# Patient Record
Sex: Female | Born: 1960
Health system: Southern US, Community
[De-identification: ages and names within clinical notes are randomized; demographics above are authoritative.]

## PROBLEM LIST (undated history)

## (undated) DIAGNOSIS — T7840XA Allergy, unspecified, initial encounter: Secondary | ICD-10-CM

## (undated) DIAGNOSIS — I10 Essential (primary) hypertension: Secondary | ICD-10-CM

## (undated) DIAGNOSIS — Z9889 Other specified postprocedural states: Secondary | ICD-10-CM

## (undated) DIAGNOSIS — E7800 Pure hypercholesterolemia, unspecified: Secondary | ICD-10-CM

## (undated) DIAGNOSIS — R112 Nausea with vomiting, unspecified: Secondary | ICD-10-CM

## (undated) DIAGNOSIS — N6091 Unspecified benign mammary dysplasia of right breast: Secondary | ICD-10-CM

## (undated) HISTORY — DX: Allergy, unspecified, initial encounter: T78.40XA

## (undated) HISTORY — DX: Essential (primary) hypertension: I10

## (undated) HISTORY — DX: Pure hypercholesterolemia, unspecified: E78.00

## (undated) HISTORY — DX: Unspecified benign mammary dysplasia of right breast: N60.91

---

## 2004-08-17 ENCOUNTER — Ambulatory Visit: Payer: Self-pay | Admitting: General Practice

## 2005-09-21 ENCOUNTER — Ambulatory Visit: Payer: Self-pay | Admitting: General Practice

## 2006-04-06 HISTORY — PX: ABDOMINAL HYSTERECTOMY: SHX81

## 2006-04-09 ENCOUNTER — Inpatient Hospital Stay: Payer: Self-pay | Admitting: Obstetrics and Gynecology

## 2006-06-19 DIAGNOSIS — C4491 Basal cell carcinoma of skin, unspecified: Secondary | ICD-10-CM

## 2006-06-19 HISTORY — DX: Basal cell carcinoma of skin, unspecified: C44.91

## 2006-09-25 ENCOUNTER — Ambulatory Visit: Payer: Self-pay | Admitting: Endocrinology

## 2007-09-27 ENCOUNTER — Ambulatory Visit: Payer: Self-pay | Admitting: Obstetrics and Gynecology

## 2008-03-06 HISTORY — PX: BREAST EXCISIONAL BIOPSY: SUR124

## 2008-03-23 ENCOUNTER — Ambulatory Visit: Payer: Self-pay | Admitting: Family Medicine

## 2008-11-26 ENCOUNTER — Ambulatory Visit: Payer: Self-pay | Admitting: Obstetrics and Gynecology

## 2008-12-04 ENCOUNTER — Ambulatory Visit: Payer: Self-pay | Admitting: Obstetrics and Gynecology

## 2008-12-22 ENCOUNTER — Ambulatory Visit: Payer: Self-pay | Admitting: Surgery

## 2008-12-25 ENCOUNTER — Ambulatory Visit: Payer: Self-pay | Admitting: Surgery

## 2008-12-25 DIAGNOSIS — N6091 Unspecified benign mammary dysplasia of right breast: Secondary | ICD-10-CM

## 2008-12-25 HISTORY — DX: Unspecified benign mammary dysplasia of right breast: N60.91

## 2009-05-17 DIAGNOSIS — C4491 Basal cell carcinoma of skin, unspecified: Secondary | ICD-10-CM

## 2009-05-17 HISTORY — DX: Basal cell carcinoma of skin, unspecified: C44.91

## 2009-09-03 ENCOUNTER — Ambulatory Visit: Payer: Self-pay | Admitting: Oncology

## 2009-09-16 ENCOUNTER — Ambulatory Visit: Payer: Self-pay | Admitting: Oncology

## 2009-10-04 ENCOUNTER — Ambulatory Visit: Payer: Self-pay | Admitting: Oncology

## 2009-10-14 ENCOUNTER — Ambulatory Visit: Payer: Self-pay | Admitting: Oncology

## 2009-11-04 ENCOUNTER — Ambulatory Visit: Payer: Self-pay | Admitting: Oncology

## 2009-11-23 ENCOUNTER — Ambulatory Visit: Payer: Self-pay | Admitting: Gynecologic Oncology

## 2009-12-04 ENCOUNTER — Ambulatory Visit: Payer: Self-pay | Admitting: Oncology

## 2009-12-16 ENCOUNTER — Ambulatory Visit: Payer: Self-pay | Admitting: Obstetrics and Gynecology

## 2011-02-08 ENCOUNTER — Ambulatory Visit: Payer: Self-pay | Admitting: Obstetrics and Gynecology

## 2012-02-13 ENCOUNTER — Ambulatory Visit: Payer: Self-pay | Admitting: Obstetrics and Gynecology

## 2012-02-22 ENCOUNTER — Encounter: Payer: Self-pay | Admitting: Internal Medicine

## 2012-02-22 ENCOUNTER — Ambulatory Visit (INDEPENDENT_AMBULATORY_CARE_PROVIDER_SITE_OTHER): Payer: BC Managed Care – PPO | Admitting: Internal Medicine

## 2012-02-22 VITALS — BP 120/76 | HR 83 | Temp 99.0°F | Resp 16 | Ht 63.5 in | Wt 141.2 lb

## 2012-02-22 DIAGNOSIS — Z9189 Other specified personal risk factors, not elsewhere classified: Secondary | ICD-10-CM

## 2012-02-22 DIAGNOSIS — E1169 Type 2 diabetes mellitus with other specified complication: Secondary | ICD-10-CM | POA: Insufficient documentation

## 2012-02-22 DIAGNOSIS — I10 Essential (primary) hypertension: Secondary | ICD-10-CM

## 2012-02-22 DIAGNOSIS — Z87898 Personal history of other specified conditions: Secondary | ICD-10-CM

## 2012-02-22 DIAGNOSIS — E7800 Pure hypercholesterolemia, unspecified: Secondary | ICD-10-CM

## 2012-02-22 MED ORDER — ATORVASTATIN CALCIUM 20 MG PO TABS: ORAL_TABLET | ORAL | Status: DC

## 2012-02-22 MED ORDER — MONTELUKAST SODIUM 10 MG PO TABS: 10.0000 mg | ORAL_TABLET | Freq: Every day | ORAL | Status: DC

## 2012-02-22 MED ORDER — LISINOPRIL 30 MG PO TABS: 30.0000 mg | ORAL_TABLET | Freq: Every day | ORAL | Status: DC

## 2012-02-22 MED ORDER — HYDROCHLOROTHIAZIDE 25 MG PO TABS: 25.0000 mg | ORAL_TABLET | Freq: Every day | ORAL | Status: DC

## 2012-02-25 ENCOUNTER — Encounter: Payer: Self-pay | Admitting: Internal Medicine

## 2012-02-25 DIAGNOSIS — Z87898 Personal history of other specified conditions: Secondary | ICD-10-CM | POA: Insufficient documentation

## 2012-02-25 NOTE — Progress Notes (Signed)
  Subjective:    Patient ID: Rhonda Sanders, female    DOB: 19-Nov-1960, 51 y.o.   MRN: [id number]  HPI 51 year old female with past history of hypertension and hypercholesterolemia who comes in today for a scheduled follow up.  She states that starting before Thanksgiving she has had some increased congestion.  Initially green.  Has been out of her Singulair and Zyrtec.  Is feeling better, but has had persistent symptoms.  Still with some nasal congestion - clear now.  No chest congestion.  No sob.  Saw Dr Logan Bores 10/13.  Had breast and pelvic exam.  Had her mammogram 02/13/12.  Still exercises.  Eating and drinking well.    Past Medical History  Diagnosis Date  . Hypertension   . Hypercholesterolemia     Current Outpatient Prescriptions on File Prior to Visit  Medication Sig Dispense Refill  . atorvastatin (LIPITOR) 20 MG tablet Take 1/2 tablet daily  90 tablet  3  . hydrochlorothiazide (HYDRODIURIL) 25 MG tablet Take 1 tablet (25 mg total) by mouth daily.  90 tablet  3  . lisinopril (PRINIVIL,ZESTRIL) 30 MG tablet Take 1 tablet (30 mg total) by mouth daily.  90 tablet  3  . mometasone (NASONEX) 50 MCG/ACT nasal spray Place 2 sprays into the nose daily.      . montelukast (SINGULAIR) 10 MG tablet Take 1 tablet (10 mg total) by mouth at bedtime.  90 tablet  3    Review of Systems Patient denies any headache, lightheadedness or dizziness.  Some nasal congestion - persistent.  no sore throat.  No chest pain, tightness or palpitations.  No increased shortness of breath or significant cough or congestion.  No nausea or vomiting.  No abdominal pain or cramping.  No bowel change, such as diarrhea, constipation, BRBPR or melana.  No urine change.        Objective:   Physical Exam Filed Vitals:   02/22/12 1359  BP: 120/76  Pulse: 83  Temp: 99 F (37.2 C)  Resp: 51   51 year old female in no acute distress.   HEENT:  Nares - clear.  OP- without lesions or erythema.  NECK:  Supple, nontender.   No audible bruit.   HEART:  Appears to be regular. LUNGS:  Without crackles or wheezing audible.  Respirations even and unlabored.   RADIAL PULSE:  Equal bilaterally.  ABDOMEN:  Soft, nontender.  No audible abdominal bruit.   EXTREMITIES:  No increased edema to be present.                     Assessment & Plan:  PERSISTENT NASAL CONGESTION.   Do not feel abx warranted.  Saline nasal flushes and nasonex as directed.  Restart her singulair.  Follow.  Notify me if symptoms worsen or do not resolve.   CARDIOVASCULAR.  Asymptomatic.    HEALTH MAINTENANCE.  Just had her breast and pelvic exam - through GYN (Dr Logan Bores).  Obtain records.  States had mammo 02/13/12.  Obtain results.  Check cholesterol.  Will need colon screening this next year.

## 2012-02-25 NOTE — Assessment & Plan Note (Signed)
On Lipitor.   Check lipid panel and liver function.  Low cholesterol diet.

## 2012-02-25 NOTE — Assessment & Plan Note (Signed)
Blood pressure under good control.  Same meds.  Check metabolic panel.  

## 2012-02-25 NOTE — Assessment & Plan Note (Signed)
Is s/p breast lumpectomy and biopsy revealed an area of columnar hyperplasia.  Saw Dr Doree Fudge.  She was referred to the cancer center and had genetic testing - was negative.  Per pt - no further treatment or w/up warranted.  Mammogram 02/08/11 - BiRADS II.  Obtain most recent mammo results.

## 2012-03-04 ENCOUNTER — Other Ambulatory Visit (INDEPENDENT_AMBULATORY_CARE_PROVIDER_SITE_OTHER): Payer: BC Managed Care – PPO

## 2012-03-04 DIAGNOSIS — I10 Essential (primary) hypertension: Secondary | ICD-10-CM

## 2012-03-04 DIAGNOSIS — E7800 Pure hypercholesterolemia, unspecified: Secondary | ICD-10-CM

## 2012-03-04 LAB — HEPATIC FUNCTION PANEL
ALT: 38 U/L — ABNORMAL HIGH (ref 0–35)
AST: 36 U/L (ref 0–37)
Albumin: 4.8 g/dL (ref 3.5–5.2)

## 2012-03-04 LAB — LIPID PANEL
HDL: 59 mg/dL (ref >39.00)
Total CHOL/HDL Ratio: 3
Triglycerides: 176 mg/dL — ABNORMAL HIGH (ref 0.0–149.0)
VLDL: 35.2 mg/dL (ref 0.0–40.0)

## 2012-03-04 LAB — BASIC METABOLIC PANEL
CO2: 28 mEq/L (ref 19–32)
Calcium: 9.6 mg/dL (ref 8.4–10.5)
GFR: 75.37 mL/min (ref >60.00)
Potassium: 3.7 mEq/L (ref 3.5–5.1)
Sodium: 136 mEq/L (ref 135–145)

## 2012-03-05 ENCOUNTER — Other Ambulatory Visit: Payer: Self-pay | Admitting: Internal Medicine

## 2012-03-05 DIAGNOSIS — E8809 Other disorders of plasma-protein metabolism, not elsewhere classified: Secondary | ICD-10-CM

## 2012-03-05 DIAGNOSIS — R945 Abnormal results of liver function studies: Secondary | ICD-10-CM

## 2012-03-05 DIAGNOSIS — R7989 Other specified abnormal findings of blood chemistry: Secondary | ICD-10-CM

## 2012-03-05 NOTE — Progress Notes (Signed)
Order placed for follow up labs 

## 2012-03-06 HISTORY — PX: BREAST BIOPSY: SHX20

## 2012-03-07 ENCOUNTER — Telehealth: Payer: Self-pay | Admitting: *Deleted

## 2012-03-08 ENCOUNTER — Encounter: Payer: Self-pay | Admitting: *Deleted

## 2012-03-27 NOTE — Telephone Encounter (Signed)
Open in

## 2012-04-02 ENCOUNTER — Other Ambulatory Visit (INDEPENDENT_AMBULATORY_CARE_PROVIDER_SITE_OTHER): Payer: BC Managed Care – PPO

## 2012-04-02 DIAGNOSIS — R945 Abnormal results of liver function studies: Secondary | ICD-10-CM

## 2012-04-02 DIAGNOSIS — R7989 Other specified abnormal findings of blood chemistry: Secondary | ICD-10-CM

## 2012-04-02 DIAGNOSIS — E8809 Other disorders of plasma-protein metabolism, not elsewhere classified: Secondary | ICD-10-CM

## 2012-04-02 LAB — CBC WITH DIFFERENTIAL/PLATELET
Basophils Relative: 0.4 % (ref 0.0–3.0)
Eosinophils Relative: 0.6 % (ref 0.0–5.0)
HCT: 40.3 % (ref 36.0–46.0)
Hemoglobin: 13.7 g/dL (ref 12.0–15.0)
Lymphs Abs: 2.4 10*3/uL (ref 0.7–4.0)
MCV: 92 fl (ref 78.0–100.0)
Monocytes Absolute: 0.7 10*3/uL (ref 0.1–1.0)
Neutro Abs: 8.1 10*3/uL — ABNORMAL HIGH (ref 1.4–7.7)
RBC: 4.38 Mil/uL (ref 3.87–5.11)
WBC: 11.3 10*3/uL — ABNORMAL HIGH (ref 4.5–10.5)

## 2012-04-02 LAB — HEPATIC FUNCTION PANEL: Total Bilirubin: 0.8 mg/dL (ref 0.3–1.2)

## 2012-04-06 ENCOUNTER — Other Ambulatory Visit: Payer: Self-pay | Admitting: Internal Medicine

## 2012-04-06 ENCOUNTER — Telehealth: Payer: Self-pay | Admitting: Internal Medicine

## 2012-04-06 DIAGNOSIS — R945 Abnormal results of liver function studies: Secondary | ICD-10-CM

## 2012-04-06 NOTE — Telephone Encounter (Signed)
Pt is coming in Tuesday for labs.  I need to also get a hepatitis panel on her.  I cannot remember the panel you gave me previously.  Do you still have the sheet that gave a list of hepatitis tests.  Thanks.

## 2012-04-06 NOTE — Progress Notes (Signed)
Orders placed for follow up labs 

## 2012-04-06 NOTE — Telephone Encounter (Signed)
Pt coming in Tuesday 8:00 for labs.  Can you please add to lab schedule.  She is aware of time.  Thanks.

## 2012-04-08 NOTE — Telephone Encounter (Signed)
Appointment made

## 2012-04-09 ENCOUNTER — Other Ambulatory Visit (INDEPENDENT_AMBULATORY_CARE_PROVIDER_SITE_OTHER): Payer: BC Managed Care – PPO

## 2012-04-09 ENCOUNTER — Other Ambulatory Visit: Payer: Self-pay | Admitting: Internal Medicine

## 2012-04-09 DIAGNOSIS — R945 Abnormal results of liver function studies: Secondary | ICD-10-CM

## 2012-04-09 DIAGNOSIS — K769 Liver disease, unspecified: Secondary | ICD-10-CM

## 2012-04-09 LAB — HEPATIC FUNCTION PANEL
Bilirubin, Direct: 0.1 mg/dL (ref 0.0–0.3)
Total Bilirubin: 0.5 mg/dL (ref 0.3–1.2)

## 2012-04-11 LAB — OTHER SOLSTAS TEST
Hep A Total Ab: NEGATIVE
Hep B C IgM: NEGATIVE
Hep B Core Total Ab: NEGATIVE
Hep B S Ab: NONREACTIVE
Hepatitis B Surface Ag: NEGATIVE
Hepatitis Be Antigen: NEGATIVE

## 2012-04-12 ENCOUNTER — Ambulatory Visit: Payer: Self-pay | Admitting: Internal Medicine

## 2012-04-16 ENCOUNTER — Telehealth: Payer: Self-pay | Admitting: Internal Medicine

## 2012-04-16 DIAGNOSIS — R748 Abnormal levels of other serum enzymes: Secondary | ICD-10-CM

## 2012-04-16 NOTE — Telephone Encounter (Signed)
Patient returning your call.

## 2012-04-16 NOTE — Telephone Encounter (Signed)
I ordered the referral to GI as pt requested.  Please send copy of there last several labs and the abdominal ultrasound.  (michelle had a copy of the ultrasound).   Let me know if problems.

## 2012-04-16 NOTE — Telephone Encounter (Signed)
Please call pt on her cell at [phone]

## 2012-04-16 NOTE — Telephone Encounter (Signed)
Order placed for referral to GI (Dr Markham Jordan) - for abnormal liver enzymes.  She called back and requested [facility].

## 2012-04-17 NOTE — Telephone Encounter (Signed)
Already spoken to patient.

## 2012-05-08 ENCOUNTER — Encounter: Payer: Self-pay | Admitting: Internal Medicine

## 2012-05-29 ENCOUNTER — Telehealth: Payer: Self-pay | Admitting: Internal Medicine

## 2012-05-29 NOTE — Telephone Encounter (Signed)
Notify pt that I called Kim.  She just needed copies of our labs (apparently did not have).  She wanted to review and then will call pt.  Tell her to let me know if she does not hear from her.  Please fax labs to [phone].  Attn:  Fransico Setters.  I printed out the labs.

## 2012-05-29 NOTE — Telephone Encounter (Signed)
Do you want me to call patient and see what she needs?

## 2012-05-29 NOTE — Telephone Encounter (Signed)
Patient said that she just wanted to know if Fransico Setters from Dr. Cyndie Mull office had spoken with you yet? Patient said that Fransico Setters had wanted to talk to you. She wanted to know if any dicisions have been made yet?

## 2012-05-29 NOTE — Telephone Encounter (Signed)
Pt just wanted to touch base with Dr. Lorin Picket about the referral appointment that she had.  Asking Dr. Lorin Picket to give her a call.

## 2012-05-29 NOTE — Telephone Encounter (Signed)
Patient advised. Labs faxed over.

## 2012-05-29 NOTE — Telephone Encounter (Signed)
Yes, just touch base and let her know I am not in the office this pm and find out what she needs

## 2012-07-18 DIAGNOSIS — L570 Actinic keratosis: Secondary | ICD-10-CM

## 2012-07-18 HISTORY — DX: Actinic keratosis: L57.0

## 2012-08-15 ENCOUNTER — Encounter: Payer: Self-pay | Admitting: Internal Medicine

## 2012-08-15 ENCOUNTER — Ambulatory Visit (INDEPENDENT_AMBULATORY_CARE_PROVIDER_SITE_OTHER): Payer: BC Managed Care – PPO | Admitting: Internal Medicine

## 2012-08-15 VITALS — BP 118/80 | HR 92 | Temp 98.4°F | Ht 63.5 in | Wt 145.5 lb

## 2012-08-15 DIAGNOSIS — R945 Abnormal results of liver function studies: Secondary | ICD-10-CM

## 2012-08-15 DIAGNOSIS — Z9189 Other specified personal risk factors, not elsewhere classified: Secondary | ICD-10-CM

## 2012-08-15 DIAGNOSIS — D72829 Elevated white blood cell count, unspecified: Secondary | ICD-10-CM

## 2012-08-15 DIAGNOSIS — K769 Liver disease, unspecified: Secondary | ICD-10-CM

## 2012-08-15 DIAGNOSIS — I10 Essential (primary) hypertension: Secondary | ICD-10-CM

## 2012-08-15 DIAGNOSIS — R748 Abnormal levels of other serum enzymes: Secondary | ICD-10-CM | POA: Insufficient documentation

## 2012-08-15 DIAGNOSIS — Z9109 Other allergy status, other than to drugs and biological substances: Secondary | ICD-10-CM | POA: Insufficient documentation

## 2012-08-15 DIAGNOSIS — Z87898 Personal history of other specified conditions: Secondary | ICD-10-CM

## 2012-08-15 DIAGNOSIS — E7800 Pure hypercholesterolemia, unspecified: Secondary | ICD-10-CM

## 2012-08-15 LAB — LIPID PANEL
Total CHOL/HDL Ratio: 4
Triglycerides: 155 mg/dL — ABNORMAL HIGH (ref 0.0–149.0)

## 2012-08-15 LAB — CBC WITH DIFFERENTIAL/PLATELET
Basophils Relative: 2.1 % (ref 0.0–3.0)
Eosinophils Relative: 0.6 % (ref 0.0–5.0)
HCT: 40.8 % (ref 36.0–46.0)
Lymphs Abs: 2.4 10*3/uL (ref 0.7–4.0)
MCV: 95.8 fl (ref 78.0–100.0)
Monocytes Absolute: 0.5 10*3/uL (ref 0.1–1.0)
Platelets: 366 10*3/uL (ref 150.0–400.0)
WBC: 7.2 10*3/uL (ref 4.5–10.5)

## 2012-08-15 LAB — BASIC METABOLIC PANEL
BUN: 15 mg/dL (ref 6–23)
CO2: 29 mEq/L (ref 19–32)
Chloride: 106 mEq/L (ref 96–112)
Creatinine, Ser: 0.9 mg/dL (ref 0.4–1.2)
Glucose, Bld: 79 mg/dL (ref 70–99)

## 2012-08-15 LAB — HEPATIC FUNCTION PANEL
AST: 27 U/L (ref 0–37)
Bilirubin, Direct: 0.1 mg/dL (ref 0.0–0.3)
Total Bilirubin: 0.8 mg/dL (ref 0.3–1.2)

## 2012-08-15 NOTE — Assessment & Plan Note (Signed)
Allergies controlled on current regimen.  Follow.   

## 2012-08-15 NOTE — Assessment & Plan Note (Addendum)
Off Lipitor.   Check lipid panel and liver function.  Low cholesterol diet.    

## 2012-08-15 NOTE — Assessment & Plan Note (Signed)
Blood pressure under good control.  Same meds.  Check metabolic panel.  

## 2012-08-15 NOTE — Assessment & Plan Note (Signed)
Is s/p breast lumpectomy and biopsy revealed an area of columnar hyperplasia.  Saw Dr Doree Fudge.  She was referred to the cancer center and had genetic testing - was negative.  Per pt - no further treatment or w/up warranted.  Mammogram 02/08/11 - BiRADS II.  Had f/u mammmogram 02/13/12.  Obtain results.

## 2012-08-15 NOTE — Progress Notes (Signed)
  Subjective:    Patient ID: Rhonda Sanders, female    DOB: Jul 11, 1960, 52 y.o.   MRN: [id number]  HPI 52 year old female with past history of hypertension and hypercholesterolemia who comes in today for a scheduled follow up.  She states overall she is doing well.  Staying active.  No chest pain or tightness.  No sob.  Saw Dr Logan Bores 10/13.  Had breast and pelvic exam.  Had her mammogram 02/13/12. Need results.  Eating and drinking well.  Off her cholesterol medication.  Had abnormal liver enzymes.  Saw GI.  Had abdominal ultrasound.  See dictated report for details.  No acute abnormality. Bowels stable.  Overall feels good.    Past Medical History  Diagnosis Date  . Hypertension   . Hypercholesterolemia     Current Outpatient Prescriptions on File Prior to Visit  Medication Sig Dispense Refill  . hydrochlorothiazide (HYDRODIURIL) 25 MG tablet Take 1 tablet (25 mg total) by mouth daily.  90 tablet  3  . lisinopril (PRINIVIL,ZESTRIL) 30 MG tablet Take 1 tablet (30 mg total) by mouth daily.  90 tablet  3  . mometasone (NASONEX) 50 MCG/ACT nasal spray Place 2 sprays into the nose daily.      . montelukast (SINGULAIR) 10 MG tablet Take 1 tablet (10 mg total) by mouth at bedtime.  90 tablet  3  . Multiple Vitamin (MULTIVITAMIN) tablet Take 1 tablet by mouth daily.      Marland Kitchen atorvastatin (LIPITOR) 20 MG tablet Take 1/2 tablet daily  90 tablet  3   No current facility-administered medications on file prior to visit.    Review of Systems Patient denies any headache, lightheadedness or dizziness.  Allergies controlled on her current regimen.   No chest pain, tightness or palpitations.  No increased shortness of breath, cough or congestion.  No nausea or vomiting. No acid reflux.  No abdominal pain or cramping.  No bowel change, such as diarrhea, constipation, BRBPR or melana.  No urine change.  Exercising.       Objective:   Physical Exam  Filed Vitals:   08/15/12 0831  BP: 118/80  Pulse: 92   Temp: 98.4 F (51.54 C)   53 year old female in no acute distress.   HEENT:  Nares - clear.  OP- without lesions or erythema.  NECK:  Supple, nontender.  No audible bruit.   HEART:  Appears to be regular. LUNGS:  Without crackles or wheezing audible.  Respirations even and unlabored.   RADIAL PULSE:  Equal bilaterally.  ABDOMEN:  Soft, nontender.  No audible abdominal bruit.   EXTREMITIES:  No increased edema to be present.                     Assessment & Plan:  CARDIOVASCULAR.  Asymptomatic.    HEALTH MAINTENANCE.  Gets her breast and pelvic exam - through GYN (Dr Logan Bores).  States had mammo 02/13/12.  Obtain results.  Saw GI regarding colon screening.  Was scheduled for this month.  She is going to have to change date.  She plans to follow up with them regarding scheduling a follow up.

## 2012-08-15 NOTE — Assessment & Plan Note (Signed)
Recheck liver panel today.  Saw GI.  Will need to discuss f/u with them.

## 2012-08-16 ENCOUNTER — Encounter: Payer: Self-pay | Admitting: Internal Medicine

## 2012-09-30 ENCOUNTER — Encounter: Payer: Self-pay | Admitting: Internal Medicine

## 2012-12-17 ENCOUNTER — Ambulatory Visit: Payer: Self-pay | Admitting: Hematology and Oncology

## 2012-12-23 ENCOUNTER — Ambulatory Visit: Payer: Self-pay | Admitting: Unknown Physician Specialty

## 2013-01-09 ENCOUNTER — Other Ambulatory Visit: Payer: Self-pay

## 2013-02-18 ENCOUNTER — Ambulatory Visit: Payer: Self-pay | Admitting: Internal Medicine

## 2013-02-18 LAB — HM MAMMOGRAPHY

## 2013-02-19 ENCOUNTER — Encounter: Payer: Self-pay | Admitting: Internal Medicine

## 2013-02-28 ENCOUNTER — Encounter: Payer: BC Managed Care – PPO | Admitting: Internal Medicine

## 2013-03-03 ENCOUNTER — Encounter: Payer: Self-pay | Admitting: Internal Medicine

## 2013-03-03 ENCOUNTER — Other Ambulatory Visit (HOSPITAL_COMMUNITY)
Admission: RE | Admit: 2013-03-03 | Discharge: 2013-03-03 | Disposition: A | Discharge status: Home / Self Care | Payer: BC Managed Care – PPO | Source: Ambulatory Visit | Attending: Internal Medicine | Admitting: Internal Medicine

## 2013-03-03 ENCOUNTER — Ambulatory Visit (INDEPENDENT_AMBULATORY_CARE_PROVIDER_SITE_OTHER): Payer: BC Managed Care – PPO | Admitting: Internal Medicine

## 2013-03-03 ENCOUNTER — Ambulatory Visit: Payer: Self-pay | Admitting: Internal Medicine

## 2013-03-03 VITALS — BP 118/80 | HR 90 | Temp 98.2°F | Ht 63.75 in | Wt 148.5 lb

## 2013-03-03 DIAGNOSIS — Z1211 Encounter for screening for malignant neoplasm of colon: Secondary | ICD-10-CM

## 2013-03-03 DIAGNOSIS — R928 Other abnormal and inconclusive findings on diagnostic imaging of breast: Secondary | ICD-10-CM

## 2013-03-03 DIAGNOSIS — Z9109 Other allergy status, other than to drugs and biological substances: Secondary | ICD-10-CM

## 2013-03-03 DIAGNOSIS — Z1151 Encounter for screening for human papillomavirus (HPV): Secondary | ICD-10-CM | POA: Insufficient documentation

## 2013-03-03 DIAGNOSIS — Z87898 Personal history of other specified conditions: Secondary | ICD-10-CM

## 2013-03-03 DIAGNOSIS — Z9189 Other specified personal risk factors, not elsewhere classified: Secondary | ICD-10-CM

## 2013-03-03 DIAGNOSIS — I10 Essential (primary) hypertension: Secondary | ICD-10-CM

## 2013-03-03 DIAGNOSIS — R748 Abnormal levels of other serum enzymes: Secondary | ICD-10-CM

## 2013-03-03 DIAGNOSIS — Z124 Encounter for screening for malignant neoplasm of cervix: Secondary | ICD-10-CM

## 2013-03-03 DIAGNOSIS — Z01419 Encounter for gynecological examination (general) (routine) without abnormal findings: Secondary | ICD-10-CM | POA: Insufficient documentation

## 2013-03-03 DIAGNOSIS — E7800 Pure hypercholesterolemia, unspecified: Secondary | ICD-10-CM

## 2013-03-03 LAB — HM MAMMOGRAPHY: HM Mammogram: ABNORMAL

## 2013-03-03 NOTE — Progress Notes (Signed)
Pre-visit discussion using our clinic review tool. No additional management support is needed unless otherwise documented below in the visit note.  

## 2013-03-04 ENCOUNTER — Encounter: Payer: Self-pay | Admitting: Internal Medicine

## 2013-03-06 ENCOUNTER — Encounter: Payer: Self-pay | Admitting: Internal Medicine

## 2013-03-06 HISTORY — PX: BREAST BIOPSY: SHX20

## 2013-03-06 NOTE — Assessment & Plan Note (Signed)
Previous lumpectomy.  Now with recent abnormal mammogram.  Radiology recommended stereotactic biopsy.  After discussion with her today (and with her history), will refer to Dr Bary Castilla for further evaluation and recommendations.

## 2013-03-06 NOTE — Assessment & Plan Note (Signed)
Off Lipitor.   Check lipid panel and liver function.  Low cholesterol diet.

## 2013-03-06 NOTE — Progress Notes (Signed)
Subjective:    Patient ID: Rhonda Sanders, female    DOB: 1960/10/10, 53 y.o.   MRN: [id number]  HPI 53 year old female with past history of hypertension and hypercholesterolemia who comes in today to follow up on these issues as well as for a complete physical exam.   She states overall she is doing well.  Staying active.  No chest pain or tightness.  No sob.  Sees Dr Amalia Hailey for her gyn exams.   Eating and drinking well.  Off her cholesterol medication.  Had abnormal liver enzymes.  Saw GI.  Had abdominal ultrasound.  See dictated report for details.  No acute abnormality. Bowels stable.  Overall feels good.  Just had f/u with GI.  States everything checked out fine.  Recent abnormal mammogram.  Recommended sterotactic biopsy.  She has had a previous lumpectomy.  Everything checked out fine then.  Mother had breast cancer.  After discussion with her today, it was decided to refer her to surgery for further evaluation.     Past Medical History  Diagnosis Date  . Hypertension   . Hypercholesterolemia     Current Outpatient Prescriptions on File Prior to Visit  Medication Sig Dispense Refill  . atorvastatin (LIPITOR) 20 MG tablet Take 1/2 tablet daily  90 tablet  3  . hydrochlorothiazide (HYDRODIURIL) 25 MG tablet Take 1 tablet (25 mg total) by mouth daily.  90 tablet  3  . lisinopril (PRINIVIL,ZESTRIL) 30 MG tablet Take 1 tablet (30 mg total) by mouth daily.  90 tablet  3  . mometasone (NASONEX) 50 MCG/ACT nasal spray Place 2 sprays into the nose daily.      . montelukast (SINGULAIR) 10 MG tablet Take 1 tablet (10 mg total) by mouth at bedtime.  90 tablet  3  . Multiple Vitamin (MULTIVITAMIN) tablet Take 1 tablet by mouth daily.       No current facility-administered medications on file prior to visit.    Review of Systems Patient denies any headache, lightheadedness or dizziness.  Allergies controlled on her current regimen.   No chest pain, tightness or palpitations.  No increased shortness  of breath, cough or congestion.  No nausea or vomiting. No acid reflux.  No abdominal pain or cramping.  No bowel change, such as diarrhea, constipation, BRBPR or melana.  No urine change.  Exercising.  Abnormal mammogram as outlined.       Objective:   Physical Exam  Filed Vitals:   03/03/13 1334  BP: 118/80  Pulse: 90  Temp: 98.2 F (36.8 C)   Blood pressure recheck:  56/62  53 year old female in no acute distress.   HEENT:  Nares- clear.  Oropharynx - without lesions. NECK:  Supple.  Nontender.  No audible bruit.  HEART:  Appears to be regular. LUNGS:  No crackles or wheezing audible.  Respirations even and unlabored.  RADIAL PULSE:  Equal bilaterally.    BREASTS:  No nipple discharge or nipple retraction present.  Could not appreciate any distinct nodules or axillary adenopathy.  ABDOMEN:  Soft, nontender.  Bowel sounds present and normal.  No audible abdominal bruit.  GU:  Normal external genitalia.  Vaginal vault without lesions.  Cervix identified.  Pap performed. Could not appreciate any adnexal masses or tenderness.   RECTAL:  Heme negative.   EXTREMITIES:  No increased edema present.  DP pulses palpable and equal bilaterally.            Assessment & Plan:  CARDIOVASCULAR.  Asymptomatic.    HEALTH MAINTENANCE.  Gets her breast and pelvic exam - through GYN (Dr Amalia Hailey).  Recent abnormal mammogram.  Refer to Dr Bary Castilla for further evaluation.  Has seen GI.

## 2013-03-06 NOTE — Assessment & Plan Note (Signed)
Blood pressure under good control.  Same meds.  Check metabolic panel.  

## 2013-03-06 NOTE — Assessment & Plan Note (Signed)
Allergies controlled on current regimen.  Follow.   

## 2013-03-06 NOTE — Assessment & Plan Note (Signed)
Recheck liver panel today.  Saw GI.  See there note for details.  States no further w/u warranted.  Follow.    

## 2013-03-11 ENCOUNTER — Ambulatory Visit (INDEPENDENT_AMBULATORY_CARE_PROVIDER_SITE_OTHER): Payer: BC Managed Care – PPO | Admitting: General Surgery

## 2013-03-11 ENCOUNTER — Encounter: Payer: Self-pay | Admitting: General Surgery

## 2013-03-11 VITALS — BP 128/74 | HR 80 | Resp 12 | Ht 63.5 in | Wt 148.0 lb

## 2013-03-11 DIAGNOSIS — R920 Mammographic microcalcification found on diagnostic imaging of breast: Secondary | ICD-10-CM | POA: Insufficient documentation

## 2013-03-11 NOTE — Progress Notes (Signed)
Patient ID: Rhonda Sanders, female   DOB: February 24, 1961, 53 y.o.   MRN: [id number]  Chief Complaint  Patient presents with  . Breast Problem    abnormal mammogram, history of right breast lumpectomy    HPI Rhonda Sanders is a 53 y.o. female.  who presents for a breast evaluation. The most recent mammogram was done on 03-03-13.  Patient does perform regular self breast checks and gets regular mammograms done.    Dr Einar Pheasant is her primary care physician. She has a known history of right breast lumpectomy for microcalcifications in 2010 completed by Sabra Heck, MD.  Review of the operative notes records that a radiologically guided biopsy was not possible.  She has not noticed any new breast changes.   HPI  Past Medical History  Diagnosis Date  . Hypertension   . Hypercholesterolemia     Past Surgical History  Procedure Laterality Date  . Abdominal hysterectomy  04/2006    partial  . Breast lumpectomy Right 12-2008    biopsy, columnar cell hyperplasia    Family History  Problem Relation Age of Onset  . Heart disease Father     first MI (25s), died age 55 - MI  . Breast cancer Mother 33  . Heart disease Paternal Grandfather   . Heart disease Paternal Uncle   . Breast cancer Paternal Grandmother   . Breast cancer Maternal Aunt   . CVA Paternal Grandmother   . Crohn's disease Brother   . Colon cancer Neg Hx     Social History History  Substance Use Topics  . Smoking status: Never Smoker   . Smokeless tobacco: Never Used  . Alcohol Use: Yes     Comment: occasional    No Known Allergies  Current Outpatient Prescriptions  Medication Sig Dispense Refill  . cetirizine (ZYRTEC) 10 MG tablet Take 10 mg by mouth daily.      . hydrochlorothiazide (HYDRODIURIL) 25 MG tablet Take 1 tablet (25 mg total) by mouth daily.  90 tablet  3  . lisinopril (PRINIVIL,ZESTRIL) 30 MG tablet Take 1 tablet (30 mg total) by mouth daily.  90 tablet  3  . Magnesium 500 MG TABS  Take by mouth daily.      . montelukast (SINGULAIR) 10 MG tablet Take 1 tablet (10 mg total) by mouth at bedtime.  90 tablet  3  . Multiple Vitamin (MULTIVITAMIN) tablet Take 1 tablet by mouth daily.       No current facility-administered medications for this visit.    Review of Systems Review of Systems  Constitutional: Negative.   Respiratory: Negative.   Cardiovascular: Negative.     Blood pressure 128/74, pulse 80, resp. rate 12, height 5' 3.5" (1.613 m), weight 148 lb (67.132 kg).  Physical Exam Physical Exam  Constitutional: She is oriented to person, place, and time. She appears well-developed and well-nourished.  Neck: Neck supple.  Cardiovascular: Normal rate, regular rhythm and normal heart sounds.   Pulmonary/Chest: Effort normal and breath sounds normal. Right breast exhibits no inverted nipple, no mass, no nipple discharge, no skin change and no tenderness. Left breast exhibits no inverted nipple, no mass, no nipple discharge, no skin change and no tenderness.    Dimpling right breast at 9 o'clock position of previous surgical site scar.  Lymphadenopathy:    She has no cervical adenopathy.    She has no axillary adenopathy.  Neurological: She is alert and oriented to person, place, and time.  Skin: Skin is  warm and dry.    Data Reviewed Pathology from the December 25, 2008 biopsy showed benign proliferative first disease with a few calcifications and focal atypical lobular hyperplasia. Additional excisions of the medial and posterior margins showed benign proliferative breast disease and columnar cell hyperplasia. A single foci of atypical hyperplasia was noted.  Screening mammograms dated February 18, 2013 suggested increasing calcifications in the right breast. Focal spot compression views dated March 03, 2013 reported a 3.5 x 4.5 x 5 cm area of calcifications in the upper outer quadrant of the right breast. Some were present on past of these, but there is no  definite increase on the last 2 years, lessor last 12 months. BI-RAD-4.  The patient is scheduled for a biopsied by the radiology service on March 17, 2013.  Assessment    Increasing right breast microcalcifications, past history foci atypical lobular hyperplasia.     Plan    My hope is the patient will be able to have a radiologically directed biopsy on March 17, 2013 as scheduled. Arrangement was made for office visit post biopsy to review the results, particularly in light of the finding of a small foci of [place] In 2010 and the patient's mother died from breast cancer at age 72.    Patient to have stereotactic biopsy (to be completed by radiologist at [facility]) as scheduled on Monday, 03-17-13. This patient will follow up with our office at the end of next week to discuss results.   Robert Bellow 03/11/2013, 7:42 PM

## 2013-03-11 NOTE — Patient Instructions (Addendum)
Continue self breast exams. Call office for any new breast issues or concerns.  Stereotactic Breast Biopsy  A stereotactic breast biopsy is a procedure in which mammography is used in the collection of a sample of breast tissue. Mammography is a type of X-ray exam of the breasts that produces an image called a mammogram. The mammogram allows your health care provider to precisely locate the area of the breast from which a tissue sample will be taken. The tissue is then examined under a microscope to see if cancerous cells are present. A breast biopsy is done when:   A lump, abnormality, or mass is seen in the breast on a breast X-ray (mammogram).   Small calcium deposits (calcifications) are seen in the breast.   The shape or appearance of the breasts changes.   The shape or appearance of the nipples changes. You may have unusual or bloody discharge coming from the nipples, or you may have crusting, retraction, or dimpling of the nipples. A breast biopsy can indicate if you need surgery or other treatment.  LET YOUR HEALTH CARE PROVIDER KNOW ABOUT:  Any allergies you have.  All medicines you are taking, including vitamins, herbs, eye drops, creams, and over-the-counter medicines.  Previous problems you or members of your family have had with the use of anesthetics.  Any blood disorders you have.  Previous surgeries you have had.  Medical conditions you have. RISKS AND COMPLICATIONS Generally, stereotactic breast biopsy is a safe procedure. However, as with any procedure, complications can occur. Possible complications include:  Infection at the needle-insertion site.   Bleeding or bruising after surgery.  The breast may become altered or deformed as a result of the procedure.  The needle may go through the chest wall into the lung area.  BEFORE THE PROCEDURE  Wear a supportive bra to the procedure.  You will be asked to remove jewelry, dentures, eyeglasses, metal  objects, or clothing that might interfere with the X-ray images. You may want to leave some of these objects at home.  Arrange for someone to drive you home after the procedure if desired. PROCEDURE  A stereotactic breast biopsy is done while you are awake. During the procedure, relax as much as possible. Let your health care provider know if you are uncomfortable, anxious, or in pain. Usually, the only discomfort felt during the procedure is caused by staying in one position for the length of the procedure. This discomfort can be reduced by carefully placed cushions. Most of the time the biopsy is done using a table with openings on it. You will be asked to lie facedown on the table and place your breasts through the openings. Your breast is compressed between metal plates to get good X-ray images. Your skin will be cleaned, and a numbing medicine (local anesthetic) will be injected. A small cut (incision) will be made in your breast. The tip of the biopsy needle will be directed through the incision. Several small pieces of suspicious tissue will be taken. Then, a final set of X-ray images will be obtained. If they show that the suspicious tissue has been mostly or completely removed, a small clip will be left at the biopsy site. This is done so that the biopsy site can be easily located if the results of the biopsy show that the tissue is cancerous.  After the procedure, the incision will be stitched (sutured) or taped and covered with a bandage (dressing). Your health care provider may apply a pressure dressing   and an ice pack to prevent bleeding and swelling in the breast.  A stereotactic breast biopsy can take 30 minutes or more. AFTER THE PROCEDURE  If you are doing well and have no problems, you will be allowed to go home.  Document Released: 11/19/2002 Document Revised: 10/23/2012 Document Reviewed: 09/19/2012 [employer] Patient Information 2014 [employer].  

## 2013-03-14 ENCOUNTER — Other Ambulatory Visit (INDEPENDENT_AMBULATORY_CARE_PROVIDER_SITE_OTHER): Payer: BC Managed Care – PPO

## 2013-03-14 DIAGNOSIS — E7800 Pure hypercholesterolemia, unspecified: Secondary | ICD-10-CM

## 2013-03-14 DIAGNOSIS — R748 Abnormal levels of other serum enzymes: Secondary | ICD-10-CM

## 2013-03-14 DIAGNOSIS — I10 Essential (primary) hypertension: Secondary | ICD-10-CM

## 2013-03-14 LAB — LIPID PANEL
CHOL/HDL RATIO: 4
CHOLESTEROL: 253 mg/dL — AB (ref 0–200)
HDL: 60.7 mg/dL (ref >39.00)
TRIGLYCERIDES: 141 mg/dL (ref 0.0–149.0)
VLDL: 28.2 mg/dL (ref 0.0–40.0)

## 2013-03-14 LAB — BASIC METABOLIC PANEL
BUN: 15 mg/dL (ref 6–23)
CALCIUM: 9.6 mg/dL (ref 8.4–10.5)
CHLORIDE: 103 meq/L (ref 96–112)
CO2: 28 mEq/L (ref 19–32)
CREATININE: 0.9 mg/dL (ref 0.4–1.2)
GFR: 72.11 mL/min (ref >60.00)
Glucose, Bld: 96 mg/dL (ref 70–99)
Potassium: 4.5 mEq/L (ref 3.5–5.1)
SODIUM: 137 meq/L (ref 135–145)

## 2013-03-14 LAB — HEPATIC FUNCTION PANEL
ALBUMIN: 4.3 g/dL (ref 3.5–5.2)
ALT: 27 U/L (ref 0–35)
AST: 26 U/L (ref 0–37)
Alkaline Phosphatase: 60 U/L (ref 39–117)
Bilirubin, Direct: 0.1 mg/dL (ref 0.0–0.3)
Total Bilirubin: 0.8 mg/dL (ref 0.3–1.2)
Total Protein: 7.4 g/dL (ref 6.0–8.3)

## 2013-03-14 LAB — LDL CHOLESTEROL, DIRECT: LDL DIRECT: 172.5 mg/dL

## 2013-03-14 LAB — TSH: TSH: 1.09 u[IU]/mL (ref 0.35–5.50)

## 2013-03-17 ENCOUNTER — Ambulatory Visit: Payer: Self-pay | Admitting: Internal Medicine

## 2013-03-18 ENCOUNTER — Other Ambulatory Visit (INDEPENDENT_AMBULATORY_CARE_PROVIDER_SITE_OTHER): Payer: BC Managed Care – PPO

## 2013-03-18 ENCOUNTER — Encounter: Payer: Self-pay | Admitting: Internal Medicine

## 2013-03-18 DIAGNOSIS — Z1211 Encounter for screening for malignant neoplasm of colon: Secondary | ICD-10-CM

## 2013-03-18 DIAGNOSIS — E7800 Pure hypercholesterolemia, unspecified: Secondary | ICD-10-CM

## 2013-03-18 LAB — FECAL OCCULT BLOOD, IMMUNOCHEMICAL: Fecal Occult Bld: NEGATIVE

## 2013-03-19 ENCOUNTER — Telehealth: Payer: Self-pay | Admitting: General Surgery

## 2013-03-19 LAB — PATHOLOGY REPORT

## 2013-03-19 MED ORDER — PRAVASTATIN SODIUM 10 MG PO TABS: 10.0000 mg | ORAL_TABLET | Freq: Every day | ORAL | Status: DC

## 2013-03-19 NOTE — Telephone Encounter (Signed)
Notifiied path benign. F/U 03/20/13 as scheduled.

## 2013-03-19 NOTE — Telephone Encounter (Signed)
I sent pt a my chart message about starting cholesterol medication.  She was agreable.  rx sent in.  She is aware.  Needs a liver panel checked in 3 weeks.  Please schedule her for a non fasting lab appt in 3 weeks and contact her with an appt date and time.  Thanks.

## 2013-03-20 ENCOUNTER — Encounter: Payer: Self-pay | Admitting: General Surgery

## 2013-03-20 ENCOUNTER — Ambulatory Visit (INDEPENDENT_AMBULATORY_CARE_PROVIDER_SITE_OTHER): Payer: BC Managed Care – PPO | Admitting: General Surgery

## 2013-03-20 VITALS — BP 120/74 | HR 80 | Resp 12 | Ht 63.0 in | Wt 147.0 lb

## 2013-03-20 DIAGNOSIS — R920 Mammographic microcalcification found on diagnostic imaging of breast: Secondary | ICD-10-CM

## 2013-03-20 NOTE — Patient Instructions (Signed)
Patient to return as needed. 

## 2013-03-20 NOTE — Progress Notes (Signed)
Patient ID: Rhonda Sanders, female   DOB: August 28, 1960, 53 y.o.   MRN: [id number]  Chief Complaint  Patient presents with  . Follow-up    stereo    HPI Rhonda Sanders is a 53 y.o. female here today following up from a right breast stereo biopsy done on 03/17/13.  HPI  Past Medical History  Diagnosis Date  . Hypertension   . Hypercholesterolemia     Past Surgical History  Procedure Laterality Date  . Abdominal hysterectomy  04/2006    partial  . Breast lumpectomy Right 12-2008    biopsy, columnar cell hyperplasia    Family History  Problem Relation Age of Onset  . Heart disease Father     first MI (69s), died age 27 - MI  . Breast cancer Mother 66  . Heart disease Paternal Grandfather   . Heart disease Paternal Uncle   . Breast cancer Paternal Grandmother   . Breast cancer Maternal Aunt   . CVA Paternal Grandmother   . Crohn's disease Brother   . Colon cancer Neg Hx     Social History History  Substance Use Topics  . Smoking status: Never Smoker   . Smokeless tobacco: Never Used  . Alcohol Use: Yes     Comment: occasional    No Known Allergies  Current Outpatient Prescriptions  Medication Sig Dispense Refill  . cetirizine (ZYRTEC) 10 MG tablet Take 10 mg by mouth daily.      . hydrochlorothiazide (HYDRODIURIL) 25 MG tablet Take 1 tablet (25 mg total) by mouth daily.  90 tablet  3  . lisinopril (PRINIVIL,ZESTRIL) 30 MG tablet Take 1 tablet (30 mg total) by mouth daily.  90 tablet  3  . Magnesium 500 MG TABS Take by mouth daily.      . montelukast (SINGULAIR) 10 MG tablet Take 1 tablet (10 mg total) by mouth at bedtime.  90 tablet  3  . Multiple Vitamin (MULTIVITAMIN) tablet Take 1 tablet by mouth daily.      . pravastatin (PRAVACHOL) 10 MG tablet Take 1 tablet (10 mg total) by mouth daily.  30 tablet  2   No current facility-administered medications for this visit.    Review of Systems Review of Systems  Constitutional: Negative.   Respiratory:  Negative.   Cardiovascular: Negative.     Blood pressure 120/74, pulse 80, resp. rate 12, height 5\' 3"  (1.6 m), weight 147 lb (66.679 kg).  Physical Exam Physical Exam  Constitutional: She is oriented to person, place, and time. She appears well-developed and well-nourished.  Pulmonary/Chest:  Steri strips remain in place, minimal bruise noted.  Neurological: She is alert and oriented to person, place, and time.  Skin: Skin is warm and dry.    Data Reviewed Pathology from the right breast biopsy dated March 17, 2013 showed columnar cell changes with microcalcifications. Ductal hyperplasia. Duct ectasia. Well delineated benign spindle cell nodule measuring 0.2 cm favoring of a roller. No evidence of malignancy or atypia.  Assessment    Benign exam status post vacuum biopsy.     Plan    The patient should resume annual screening mammograms in December 2015 under the care of her primary care physician.        Rhonda Sanders 03/20/2013, 8:45 PM

## 2013-04-09 ENCOUNTER — Other Ambulatory Visit: Payer: BC Managed Care – PPO

## 2013-04-11 ENCOUNTER — Other Ambulatory Visit (INDEPENDENT_AMBULATORY_CARE_PROVIDER_SITE_OTHER): Payer: BC Managed Care – PPO

## 2013-04-11 DIAGNOSIS — E7800 Pure hypercholesterolemia, unspecified: Secondary | ICD-10-CM

## 2013-04-11 LAB — HEPATIC FUNCTION PANEL
ALBUMIN: 4.2 g/dL (ref 3.5–5.2)
ALT: 27 U/L (ref 0–35)
AST: 26 U/L (ref 0–37)
Alkaline Phosphatase: 60 U/L (ref 39–117)
Bilirubin, Direct: 0 mg/dL (ref 0.0–0.3)
TOTAL PROTEIN: 7.5 g/dL (ref 6.0–8.3)
Total Bilirubin: 0.8 mg/dL (ref 0.3–1.2)

## 2013-04-12 ENCOUNTER — Telehealth: Payer: Self-pay | Admitting: Internal Medicine

## 2013-04-12 ENCOUNTER — Encounter: Payer: Self-pay | Admitting: Internal Medicine

## 2013-04-12 DIAGNOSIS — R7989 Other specified abnormal findings of blood chemistry: Secondary | ICD-10-CM

## 2013-04-12 DIAGNOSIS — R945 Abnormal results of liver function studies: Secondary | ICD-10-CM

## 2013-04-12 DIAGNOSIS — E7800 Pure hypercholesterolemia, unspecified: Secondary | ICD-10-CM

## 2013-04-12 NOTE — Telephone Encounter (Signed)
Pt notified of lab results via my chart.  Needs a f/u non fasting lab in one month.  Please schedule and contact her with a lab appt date and time.  Thanks.

## 2013-04-14 NOTE — Telephone Encounter (Signed)
Mailed unread message to pt  

## 2013-04-15 NOTE — Telephone Encounter (Signed)
appontment 3/10 sent pt my chart message

## 2013-05-13 ENCOUNTER — Other Ambulatory Visit: Payer: BC Managed Care – PPO

## 2013-06-13 ENCOUNTER — Other Ambulatory Visit (INDEPENDENT_AMBULATORY_CARE_PROVIDER_SITE_OTHER): Payer: BC Managed Care – PPO

## 2013-06-13 DIAGNOSIS — E7800 Pure hypercholesterolemia, unspecified: Secondary | ICD-10-CM

## 2013-06-13 LAB — HEPATIC FUNCTION PANEL
ALT: 29 U/L (ref 0–35)
AST: 29 U/L (ref 0–37)
Albumin: 4.2 g/dL (ref 3.5–5.2)
Alkaline Phosphatase: 61 U/L (ref 39–117)
BILIRUBIN DIRECT: 0.1 mg/dL (ref 0.0–0.3)
BILIRUBIN TOTAL: 0.8 mg/dL (ref 0.3–1.2)
TOTAL PROTEIN: 6.9 g/dL (ref 6.0–8.3)

## 2013-06-15 ENCOUNTER — Telehealth: Payer: Self-pay | Admitting: Internal Medicine

## 2013-06-15 ENCOUNTER — Encounter: Payer: Self-pay | Admitting: Internal Medicine

## 2013-06-15 DIAGNOSIS — R748 Abnormal levels of other serum enzymes: Secondary | ICD-10-CM

## 2013-06-15 DIAGNOSIS — I10 Essential (primary) hypertension: Secondary | ICD-10-CM

## 2013-06-15 DIAGNOSIS — E7800 Pure hypercholesterolemia, unspecified: Secondary | ICD-10-CM

## 2013-06-15 NOTE — Telephone Encounter (Signed)
Pt notified of lab results via my chart.  Needs fasting labs in 4-6 weeks.  Please schedule and contact her with a lab appt date and time.  Thanks.  Dr Nicki Reaper

## 2013-06-16 NOTE — Telephone Encounter (Signed)
Appointment 5/14.  Sent pt my chart message with appointment date and time

## 2013-07-17 ENCOUNTER — Other Ambulatory Visit: Payer: BC Managed Care – PPO

## 2013-07-21 ENCOUNTER — Other Ambulatory Visit: Payer: BC Managed Care – PPO

## 2013-07-21 DIAGNOSIS — R748 Abnormal levels of other serum enzymes: Secondary | ICD-10-CM

## 2013-07-21 DIAGNOSIS — I10 Essential (primary) hypertension: Secondary | ICD-10-CM

## 2013-07-21 DIAGNOSIS — E7800 Pure hypercholesterolemia, unspecified: Secondary | ICD-10-CM

## 2013-09-03 ENCOUNTER — Ambulatory Visit (INDEPENDENT_AMBULATORY_CARE_PROVIDER_SITE_OTHER): Payer: BC Managed Care – PPO | Admitting: Internal Medicine

## 2013-09-03 ENCOUNTER — Encounter: Payer: Self-pay | Admitting: Internal Medicine

## 2013-09-03 VITALS — BP 120/80 | HR 74 | Temp 98.2°F | Ht 63.5 in | Wt 147.8 lb

## 2013-09-03 DIAGNOSIS — R920 Mammographic microcalcification found on diagnostic imaging of breast: Secondary | ICD-10-CM

## 2013-09-03 DIAGNOSIS — Z9109 Other allergy status, other than to drugs and biological substances: Secondary | ICD-10-CM

## 2013-09-03 DIAGNOSIS — E7800 Pure hypercholesterolemia, unspecified: Secondary | ICD-10-CM

## 2013-09-03 DIAGNOSIS — I10 Essential (primary) hypertension: Secondary | ICD-10-CM

## 2013-09-03 DIAGNOSIS — R5381 Other malaise: Secondary | ICD-10-CM

## 2013-09-03 DIAGNOSIS — R5383 Other fatigue: Secondary | ICD-10-CM

## 2013-09-03 DIAGNOSIS — R748 Abnormal levels of other serum enzymes: Secondary | ICD-10-CM

## 2013-09-03 NOTE — Progress Notes (Signed)
Pre visit review using our clinic review tool, if applicable. No additional management support is needed unless otherwise documented below in the visit note. 

## 2013-09-06 ENCOUNTER — Encounter: Payer: Self-pay | Admitting: Internal Medicine

## 2013-09-06 DIAGNOSIS — R5383 Other fatigue: Secondary | ICD-10-CM | POA: Insufficient documentation

## 2013-09-06 NOTE — Assessment & Plan Note (Signed)
Check cbc, met c and tsh.    

## 2013-09-06 NOTE — Assessment & Plan Note (Signed)
Allergies controlled on current regimen.  Follow.

## 2013-09-06 NOTE — Progress Notes (Signed)
Subjective:    Patient ID: Rhonda Sanders, female    DOB: [birth year], 53 y.o.   MRN: [id number]  HPI 53 year old female with past history of hypertension and hypercholesterolemia who comes in today for a scheduled follow up.   She states overall she is doing well.  Staying active.  No chest pain or tightness.  No sob.  Was seeing Dr Amalia Hailey for her gyn exams.   Plans now to have gyn exams here.  Eating and drinking well.   Had abnormal liver enzymes.  Saw GI.  Had abdominal ultrasound.  See dictated report for details.  No acute abnormality. Bowels stable.  Overall feels good.  Just had f/u with GI.  States everything checked out fine.  Recent abnormal mammogram.  Recommended sterotactic biopsy.  She has had a previous lumpectomy.  Everything checked out fine then.  Mother had breast cancer.  She is s/p breast biopsy 03/17/13.  Negative.  Per note, recommended f/u mammogram - yearly schedule.  Pt thought due 6 month f/u mammogram.  .     Past Medical History  Diagnosis Date  . Hypertension   . Hypercholesterolemia     Current Outpatient Prescriptions on File Prior to Visit  Medication Sig Dispense Refill  . cetirizine (ZYRTEC) 10 MG tablet Take 10 mg by mouth daily.      . hydrochlorothiazide (HYDRODIURIL) 25 MG tablet Take 1 tablet (25 mg total) by mouth daily.  90 tablet  3  . lisinopril (PRINIVIL,ZESTRIL) 30 MG tablet Take 1 tablet (30 mg total) by mouth daily.  90 tablet  3  . Magnesium 500 MG TABS Take by mouth daily.      . montelukast (SINGULAIR) 10 MG tablet Take 1 tablet (10 mg total) by mouth at bedtime.  90 tablet  3  . Multiple Vitamin (MULTIVITAMIN) tablet Take 1 tablet by mouth daily.      . pravastatin (PRAVACHOL) 10 MG tablet Take 1 tablet (10 mg total) by mouth daily.  30 tablet  2   No current facility-administered medications on file prior to visit.    Review of Systems Patient denies any headache, lightheadedness or dizziness.  Allergies controlled on her current  regimen.   No chest pain, tightness or palpitations.  No increased shortness of breath, cough or congestion.  No nausea or vomiting. No acid reflux.  No abdominal pain or cramping.  No bowel change, such as diarrhea, constipation, BRBPR or melana.  On magnesium.  Bowels better.  No urine change.  Exercising.  Abnormal mammogram as outlined.  Biopsy ok.  Does report some fatigue.      Objective:   Physical Exam  Filed Vitals:   09/03/13 0818  BP: 120/80  Pulse: 74  Temp: 98.2 F (36.8 C)   Blood pressure recheck:  35/48  53 year old female in no acute distress.   HEENT:  Nares- clear.  Oropharynx - without lesions. NECK:  Supple.  Nontender.  No audible bruit.  HEART:  Appears to be regular. LUNGS:  No crackles or wheezing audible.  Respirations even and unlabored.  RADIAL PULSE:  Equal bilaterally.   ABDOMEN:  Soft, nontender.  Bowel sounds present and normal.  No audible abdominal bruit.    EXTREMITIES:  No increased edema present.  DP pulses palpable and equal bilaterally.            Assessment & Plan:  CARDIOVASCULAR.  Asymptomatic.    HEALTH MAINTENANCE.  Was getting her breast and pelvic  exam - through GYN (Dr Amalia Hailey).  Plans to have here now.  Recent abnormal mammogram.  Referred to Dr Bary Castilla for further evaluation.  Biopsy negative.  Will clarify with Dr Bary Castilla regarding when due for f/u mammogram.  Has seen GI.

## 2013-09-06 NOTE — Assessment & Plan Note (Signed)
Biopsy negative 03/17/13.  Per note, recommend f/u mammogram yearly.  Pt thought needed f/u mammogram in 6 months.  Will clarify with Dr Bary Castilla when due.

## 2013-09-06 NOTE — Assessment & Plan Note (Signed)
Recheck liver panel today.  Saw GI.  See there note for details.  States no further w/u warranted.  Follow.

## 2013-09-06 NOTE — Assessment & Plan Note (Signed)
Blood pressure under good control.  Same meds.  Check metabolic panel.  

## 2013-09-06 NOTE — Assessment & Plan Note (Signed)
Check lipid panel and liver function.  Low cholesterol diet.  On pravastatin.

## 2013-09-11 ENCOUNTER — Encounter: Payer: Self-pay | Admitting: Internal Medicine

## 2013-09-11 ENCOUNTER — Other Ambulatory Visit: Payer: Self-pay | Admitting: Internal Medicine

## 2013-09-11 ENCOUNTER — Other Ambulatory Visit: Payer: BC Managed Care – PPO

## 2013-09-11 DIAGNOSIS — I10 Essential (primary) hypertension: Secondary | ICD-10-CM

## 2013-09-11 DIAGNOSIS — R5383 Other fatigue: Secondary | ICD-10-CM

## 2013-09-11 DIAGNOSIS — R748 Abnormal levels of other serum enzymes: Secondary | ICD-10-CM

## 2013-09-11 DIAGNOSIS — E7800 Pure hypercholesterolemia, unspecified: Secondary | ICD-10-CM

## 2013-09-11 NOTE — Progress Notes (Signed)
Orders placed for labs

## 2013-09-16 ENCOUNTER — Telehealth: Payer: Self-pay | Admitting: *Deleted

## 2013-09-16 DIAGNOSIS — R748 Abnormal levels of other serum enzymes: Secondary | ICD-10-CM

## 2013-09-16 DIAGNOSIS — I10 Essential (primary) hypertension: Secondary | ICD-10-CM

## 2013-09-16 DIAGNOSIS — R5383 Other fatigue: Secondary | ICD-10-CM

## 2013-09-16 DIAGNOSIS — E7800 Pure hypercholesterolemia, unspecified: Secondary | ICD-10-CM

## 2013-09-16 NOTE — Telephone Encounter (Signed)
Orders placed for labs

## 2013-09-16 NOTE — Telephone Encounter (Signed)
Pt coming in tomorrow what labs and dx?  

## 2013-09-17 ENCOUNTER — Other Ambulatory Visit (INDEPENDENT_AMBULATORY_CARE_PROVIDER_SITE_OTHER): Payer: BC Managed Care – PPO

## 2013-09-17 ENCOUNTER — Encounter: Payer: Self-pay | Admitting: Internal Medicine

## 2013-09-17 DIAGNOSIS — R5383 Other fatigue: Secondary | ICD-10-CM

## 2013-09-17 DIAGNOSIS — R5381 Other malaise: Secondary | ICD-10-CM

## 2013-09-17 DIAGNOSIS — I10 Essential (primary) hypertension: Secondary | ICD-10-CM

## 2013-09-17 DIAGNOSIS — E7800 Pure hypercholesterolemia, unspecified: Secondary | ICD-10-CM

## 2013-09-17 DIAGNOSIS — R748 Abnormal levels of other serum enzymes: Secondary | ICD-10-CM

## 2013-09-17 LAB — HEPATIC FUNCTION PANEL
ALBUMIN: 4.2 g/dL (ref 3.5–5.2)
ALT: 25 U/L (ref 0–35)
AST: 24 U/L (ref 0–37)
Alkaline Phosphatase: 57 U/L (ref 39–117)
Bilirubin, Direct: 0 mg/dL (ref 0.0–0.3)
Total Bilirubin: 0.5 mg/dL (ref 0.2–1.2)
Total Protein: 7.2 g/dL (ref 6.0–8.3)

## 2013-09-17 LAB — BASIC METABOLIC PANEL
BUN: 20 mg/dL (ref 6–23)
CHLORIDE: 101 meq/L (ref 96–112)
CO2: 24 meq/L (ref 19–32)
Calcium: 9.5 mg/dL (ref 8.4–10.5)
Creatinine, Ser: 0.9 mg/dL (ref 0.4–1.2)
GFR: 69.23 mL/min (ref >60.00)
Glucose, Bld: 94 mg/dL (ref 70–99)
POTASSIUM: 5.1 meq/L (ref 3.5–5.1)
SODIUM: 136 meq/L (ref 135–145)

## 2013-09-17 LAB — LIPID PANEL
Cholesterol: 199 mg/dL (ref 0–200)
HDL: 57.7 mg/dL (ref >39.00)
LDL Cholesterol: 120 mg/dL — ABNORMAL HIGH (ref 0–99)
NONHDL: 141.3
Total CHOL/HDL Ratio: 3
Triglycerides: 105 mg/dL (ref 0.0–149.0)
VLDL: 21 mg/dL (ref 0.0–40.0)

## 2013-09-17 LAB — CBC WITH DIFFERENTIAL/PLATELET
Basophils Absolute: 0 10*3/uL (ref 0.0–0.1)
Basophils Relative: 0.4 % (ref 0.0–3.0)
Eosinophils Absolute: 0.1 10*3/uL (ref 0.0–0.7)
Eosinophils Relative: 1 % (ref 0.0–5.0)
HEMATOCRIT: 41.8 % (ref 36.0–46.0)
HEMOGLOBIN: 14.1 g/dL (ref 12.0–15.0)
LYMPHS ABS: 2.6 10*3/uL (ref 0.7–4.0)
LYMPHS PCT: 23.9 % (ref 12.0–46.0)
MCHC: 33.6 g/dL (ref 30.0–36.0)
MCV: 92.4 fl (ref 78.0–100.0)
MONOS PCT: 4.3 % (ref 3.0–12.0)
Monocytes Absolute: 0.5 10*3/uL (ref 0.1–1.0)
NEUTROS ABS: 7.6 10*3/uL (ref 1.4–7.7)
Neutrophils Relative %: 70.4 % (ref 43.0–77.0)
Platelets: 393 10*3/uL (ref 150.0–400.0)
RBC: 4.52 Mil/uL (ref 3.87–5.11)
RDW: 13.1 % (ref 11.5–15.5)
WBC: 10.9 10*3/uL — ABNORMAL HIGH (ref 4.0–10.5)

## 2014-01-05 ENCOUNTER — Encounter: Payer: Self-pay | Admitting: Internal Medicine

## 2014-03-04 ENCOUNTER — Ambulatory Visit (INDEPENDENT_AMBULATORY_CARE_PROVIDER_SITE_OTHER): Payer: BC Managed Care – PPO | Admitting: Internal Medicine

## 2014-03-04 ENCOUNTER — Encounter: Payer: Self-pay | Admitting: Internal Medicine

## 2014-03-04 ENCOUNTER — Other Ambulatory Visit (HOSPITAL_COMMUNITY)
Admission: RE | Admit: 2014-03-04 | Discharge: 2014-03-04 | Disposition: A | Discharge status: Home / Self Care | Payer: BC Managed Care – PPO | Source: Ambulatory Visit | Attending: Internal Medicine | Admitting: Internal Medicine

## 2014-03-04 VITALS — Ht 63.0 in | Wt 146.5 lb

## 2014-03-04 DIAGNOSIS — I10 Essential (primary) hypertension: Secondary | ICD-10-CM

## 2014-03-04 DIAGNOSIS — R928 Other abnormal and inconclusive findings on diagnostic imaging of breast: Secondary | ICD-10-CM

## 2014-03-04 DIAGNOSIS — R748 Abnormal levels of other serum enzymes: Secondary | ICD-10-CM

## 2014-03-04 DIAGNOSIS — E7800 Pure hypercholesterolemia, unspecified: Secondary | ICD-10-CM

## 2014-03-04 DIAGNOSIS — Z01419 Encounter for gynecological examination (general) (routine) without abnormal findings: Secondary | ICD-10-CM | POA: Insufficient documentation

## 2014-03-04 DIAGNOSIS — Z9189 Other specified personal risk factors, not elsewhere classified: Secondary | ICD-10-CM

## 2014-03-04 DIAGNOSIS — Z87898 Personal history of other specified conditions: Secondary | ICD-10-CM

## 2014-03-04 DIAGNOSIS — Z1151 Encounter for screening for human papillomavirus (HPV): Secondary | ICD-10-CM | POA: Insufficient documentation

## 2014-03-04 NOTE — Progress Notes (Signed)
Subjective:    Patient ID: Rhonda Sanders, female    DOB: 1960-12-03, 53 y.o.   MRN: [id number]  HPI 53 year old female with past history of hypertension and hypercholesterolemia who comes in today to follow up on these issues as well as for a complete physical exam.   She states overall she is doing well.  Staying active.  No chest pain or tightness.  No sob.  Was seeing Dr Amalia Hailey for her gyn exams.   Getting her physicals here now.   Eating and drinking well.   Had abnormal liver enzymes.  Saw GI.  Had abdominal ultrasound.  See dictated report for details.  No acute abnormality. Bowels stable.  Overall feels good.   Has a history of abnormal mammogram.  Recommended sterotactic biopsy.  She has had a previous lumpectomy.  Everything checked out fine then.  Mother had breast cancer.  She is s/p breast biopsy 03/17/13.  Negative.  Per note, recommended f/u mammogram - yearly schedule.  Due f/u mammogram now.     Past Medical History  Diagnosis Date  . Hypertension   . Hypercholesterolemia     Current Outpatient Prescriptions on File Prior to Visit  Medication Sig Dispense Refill  . cetirizine (ZYRTEC) 10 MG tablet Take 10 mg by mouth daily.    . hydrochlorothiazide (HYDRODIURIL) 25 MG tablet Take 1 tablet (25 mg total) by mouth daily. 90 tablet 3  . lisinopril (PRINIVIL,ZESTRIL) 30 MG tablet Take 1 tablet (30 mg total) by mouth daily. 90 tablet 3  . Magnesium 500 MG TABS Take by mouth daily.    . montelukast (SINGULAIR) 10 MG tablet Take 1 tablet (10 mg total) by mouth at bedtime. 90 tablet 3  . Multiple Vitamin (MULTIVITAMIN) tablet Take 1 tablet by mouth daily.    . pravastatin (PRAVACHOL) 10 MG tablet Take 1 tablet (10 mg total) by mouth daily. 30 tablet 2   No current facility-administered medications on file prior to visit.    Review of Systems Patient denies any headache, lightheadedness or dizziness.  Allergies controlled on her current regimen.   No chest pain, tightness or  palpitations.  No increased shortness of breath, cough or congestion.  No nausea or vomiting. No acid reflux.  No abdominal pain or cramping.  No bowel change, such as diarrhea, constipation, BRBPR or melena.  Bowels better.  No urine change.  Exercising.  Abnormal mammogram as outlined.  Biopsy ok.  Due f/u mammogram.  Recently had a flare with her back.  Resolved now.  Doing well.  Is exercising.       Objective:   Physical Exam blood pressure checked by me:  122/84 Pulse:  52  53 year old female in no acute distress.   HEENT:  Nares- clear.  Oropharynx - without lesions. NECK:  Supple.  Nontender.  No audible bruit.  HEART:  Appears to be regular. LUNGS:  No crackles or wheezing audible.  Respirations even and unlabored.  RADIAL PULSE:  Equal bilaterally.    BREASTS:  No nipple discharge or nipple retraction present.  Could not appreciate any distinct nodules or axillary adenopathy.  ABDOMEN:  Soft, nontender.  Bowel sounds present and normal.  No audible abdominal bruit.  GU:  Not performed.     EXTREMITIES:  No increased edema present.  DP pulses palpable and equal bilaterally.          Assessment & Plan:  1. Abnormal mammogram S/p negative biopsy earlier this year.  Due  f/u mammogram.   Schedule.   - MM Digital Diagnostic Bilat; Future  2. Essential hypertension Blood pressure doing well.  continue same medication regimen.  Follow pressures and follow met b.   - CBC with Differential; Future - Basic metabolic panel; Future  3. Hypercholesterolemia Low cholesterol diet and exercise.  On pravastatin.  - Lipid panel; Future - Hepatic function panel; Future  4. History of abnormal mammogram Had negative biopsy as outlined.  Due f/u diagnostic mammogram.    5. Abnormal liver enzymes Saw GI.  W/up as outlined.  Recheck liver panel.    6. CARDIOVASCULAR.  Asymptomatic.    HEALTH MAINTENANCE.  Was getting her breast and pelvic exam - through GYN (Dr Amalia Hailey).  Dr Amalia Hailey  relocated.  Physical and pap today.   Has a history of abnormal mammogram.  Referred to Dr Bary Castilla for further evaluation.  Biopsy negative.  Due f/u mammogram.  Schedule.   Has seen GI.     I spent 25 minutes with the patient and more than 50% of the time was spent in consultation regarding the above.

## 2014-03-04 NOTE — Progress Notes (Signed)
Pre visit review using our clinic review tool, if applicable. No additional management support is needed unless otherwise documented below in the visit note. 

## 2014-03-05 LAB — CYTOLOGY - PAP

## 2014-03-08 ENCOUNTER — Encounter: Payer: Self-pay | Admitting: Internal Medicine

## 2014-03-08 ENCOUNTER — Other Ambulatory Visit: Payer: Self-pay | Admitting: Internal Medicine

## 2014-03-09 ENCOUNTER — Encounter: Payer: Self-pay | Admitting: Internal Medicine

## 2014-03-17 ENCOUNTER — Other Ambulatory Visit (INDEPENDENT_AMBULATORY_CARE_PROVIDER_SITE_OTHER): Payer: BLUE CROSS/BLUE SHIELD

## 2014-03-17 DIAGNOSIS — E7800 Pure hypercholesterolemia, unspecified: Secondary | ICD-10-CM

## 2014-03-17 DIAGNOSIS — I10 Essential (primary) hypertension: Secondary | ICD-10-CM

## 2014-03-17 LAB — CBC WITH DIFFERENTIAL/PLATELET
BASOS PCT: 0.6 % (ref 0.0–3.0)
Basophils Absolute: 0.1 10*3/uL (ref 0.0–0.1)
EOS PCT: 1.4 % (ref 0.0–5.0)
Eosinophils Absolute: 0.2 10*3/uL (ref 0.0–0.7)
HCT: 43.9 % (ref 36.0–46.0)
Hemoglobin: 14.6 g/dL (ref 12.0–15.0)
LYMPHS PCT: 23.9 % (ref 12.0–46.0)
Lymphs Abs: 3 10*3/uL (ref 0.7–4.0)
MCHC: 33.4 g/dL (ref 30.0–36.0)
MCV: 91.5 fl (ref 78.0–100.0)
Monocytes Absolute: 0.5 10*3/uL (ref 0.1–1.0)
Monocytes Relative: 4.4 % (ref 3.0–12.0)
Neutro Abs: 8.7 10*3/uL — ABNORMAL HIGH (ref 1.4–7.7)
Neutrophils Relative %: 69.7 % (ref 43.0–77.0)
Platelets: 410 10*3/uL — ABNORMAL HIGH (ref 150.0–400.0)
RBC: 4.8 Mil/uL (ref 3.87–5.11)
RDW: 12.9 % (ref 11.5–15.5)
WBC: 12.5 10*3/uL — AB (ref 4.0–10.5)

## 2014-03-17 LAB — LIPID PANEL
CHOLESTEROL: 221 mg/dL — AB (ref 0–200)
HDL: 58.8 mg/dL (ref >39.00)
LDL Cholesterol: 140 mg/dL — ABNORMAL HIGH (ref 0–99)
NonHDL: 162.2
TRIGLYCERIDES: 112 mg/dL (ref 0.0–149.0)
Total CHOL/HDL Ratio: 4
VLDL: 22.4 mg/dL (ref 0.0–40.0)

## 2014-03-17 LAB — BASIC METABOLIC PANEL
BUN: 14 mg/dL (ref 6–23)
CALCIUM: 9.2 mg/dL (ref 8.4–10.5)
CO2: 30 mEq/L (ref 19–32)
Chloride: 100 mEq/L (ref 96–112)
Creatinine, Ser: 0.8 mg/dL (ref 0.4–1.2)
GFR: 81.35 mL/min (ref >60.00)
Glucose, Bld: 94 mg/dL (ref 70–99)
Potassium: 3.8 mEq/L (ref 3.5–5.1)
SODIUM: 134 meq/L — AB (ref 135–145)

## 2014-03-17 LAB — HEPATIC FUNCTION PANEL
ALT: 29 U/L (ref 0–35)
AST: 30 U/L (ref 0–37)
Albumin: 4.6 g/dL (ref 3.5–5.2)
Alkaline Phosphatase: 63 U/L (ref 39–117)
BILIRUBIN DIRECT: 0.1 mg/dL (ref 0.0–0.3)
Total Bilirubin: 0.8 mg/dL (ref 0.2–1.2)
Total Protein: 8 g/dL (ref 6.0–8.3)

## 2014-03-17 LAB — TSH: TSH: 1.29 u[IU]/mL (ref 0.35–4.50)

## 2014-03-20 ENCOUNTER — Telehealth: Payer: Self-pay | Admitting: Internal Medicine

## 2014-03-20 ENCOUNTER — Encounter: Payer: Self-pay | Admitting: Internal Medicine

## 2014-03-20 DIAGNOSIS — D72829 Elevated white blood cell count, unspecified: Secondary | ICD-10-CM

## 2014-03-20 DIAGNOSIS — E871 Hypo-osmolality and hyponatremia: Secondary | ICD-10-CM

## 2014-03-20 MED ORDER — PRAVASTATIN SODIUM 20 MG PO TABS: 20.0000 mg | ORAL_TABLET | Freq: Every day | ORAL | Status: DC

## 2014-03-20 NOTE — Telephone Encounter (Signed)
rx sent in for pravastatin 20mg  q day.

## 2014-03-20 NOTE — Telephone Encounter (Signed)
Pt notified of lab results via my chart.  Needs non fasting lab appt in 1-2 weeks.  Please schedule and contact her with an appt date and time.  Thanks.    Dr Nicki Reaper

## 2014-03-23 ENCOUNTER — Telehealth: Payer: Self-pay

## 2014-03-23 ENCOUNTER — Ambulatory Visit: Payer: Self-pay | Admitting: Internal Medicine

## 2014-03-23 ENCOUNTER — Encounter: Payer: Self-pay | Admitting: Internal Medicine

## 2014-03-23 DIAGNOSIS — R921 Mammographic calcification found on diagnostic imaging of breast: Secondary | ICD-10-CM

## 2014-03-23 LAB — HM MAMMOGRAPHY

## 2014-03-23 NOTE — Telephone Encounter (Signed)
Rhonda Sanders called and is hoping to speak with [name] regarding and order.   (phone- [phone] Rhonda Sanders )

## 2014-03-25 ENCOUNTER — Encounter: Payer: Self-pay | Admitting: Internal Medicine

## 2014-04-06 ENCOUNTER — Other Ambulatory Visit (INDEPENDENT_AMBULATORY_CARE_PROVIDER_SITE_OTHER): Payer: BLUE CROSS/BLUE SHIELD

## 2014-04-06 DIAGNOSIS — E871 Hypo-osmolality and hyponatremia: Secondary | ICD-10-CM

## 2014-04-06 DIAGNOSIS — D72829 Elevated white blood cell count, unspecified: Secondary | ICD-10-CM

## 2014-04-06 LAB — CBC WITH DIFFERENTIAL/PLATELET
Basophils Absolute: 0.1 10*3/uL (ref 0.0–0.1)
Basophils Relative: 0.5 % (ref 0.0–3.0)
EOS ABS: 0.2 10*3/uL (ref 0.0–0.7)
Eosinophils Relative: 1.3 % (ref 0.0–5.0)
HEMATOCRIT: 44.7 % (ref 36.0–46.0)
Hemoglobin: 15.1 g/dL — ABNORMAL HIGH (ref 12.0–15.0)
LYMPHS ABS: 2.7 10*3/uL (ref 0.7–4.0)
Lymphocytes Relative: 22.1 % (ref 12.0–46.0)
MCHC: 33.8 g/dL (ref 30.0–36.0)
MCV: 89.9 fl (ref 78.0–100.0)
MONOS PCT: 4.3 % (ref 3.0–12.0)
Monocytes Absolute: 0.5 10*3/uL (ref 0.1–1.0)
NEUTROS ABS: 8.9 10*3/uL — AB (ref 1.4–7.7)
Neutrophils Relative %: 71.8 % (ref 43.0–77.0)
Platelets: 415 10*3/uL — ABNORMAL HIGH (ref 150.0–400.0)
RBC: 4.97 Mil/uL (ref 3.87–5.11)
RDW: 13.1 % (ref 11.5–15.5)
WBC: 12.3 10*3/uL — ABNORMAL HIGH (ref 4.0–10.5)

## 2014-04-06 LAB — SODIUM: SODIUM: 137 meq/L (ref 135–145)

## 2014-04-09 ENCOUNTER — Telehealth: Payer: Self-pay | Admitting: Internal Medicine

## 2014-04-09 ENCOUNTER — Encounter: Payer: Self-pay | Admitting: Internal Medicine

## 2014-04-09 DIAGNOSIS — D72829 Elevated white blood cell count, unspecified: Secondary | ICD-10-CM

## 2014-04-09 NOTE — Telephone Encounter (Signed)
Pt was notified of labs via my chart.  Needs a f/u non fasting lab in 4 weeks.  Please schedule and contact her with a lab appt date and time.  Thanks.

## 2014-05-18 ENCOUNTER — Other Ambulatory Visit (INDEPENDENT_AMBULATORY_CARE_PROVIDER_SITE_OTHER): Payer: BLUE CROSS/BLUE SHIELD

## 2014-05-18 ENCOUNTER — Encounter: Payer: Self-pay | Admitting: Internal Medicine

## 2014-05-18 DIAGNOSIS — D72829 Elevated white blood cell count, unspecified: Secondary | ICD-10-CM

## 2014-05-18 LAB — CBC WITH DIFFERENTIAL/PLATELET
Basophils Absolute: 0 10*3/uL (ref 0.0–0.1)
Basophils Relative: 0.5 % (ref 0.0–3.0)
EOS PCT: 0.8 % (ref 0.0–5.0)
Eosinophils Absolute: 0.1 10*3/uL (ref 0.0–0.7)
HEMATOCRIT: 43.3 % (ref 36.0–46.0)
HEMOGLOBIN: 14.8 g/dL (ref 12.0–15.0)
LYMPHS PCT: 32.6 % (ref 12.0–46.0)
Lymphs Abs: 3 10*3/uL (ref 0.7–4.0)
MCHC: 34.3 g/dL (ref 30.0–36.0)
MCV: 89.6 fl (ref 78.0–100.0)
MONO ABS: 0.6 10*3/uL (ref 0.1–1.0)
Monocytes Relative: 6.8 % (ref 3.0–12.0)
NEUTROS PCT: 59.3 % (ref 43.0–77.0)
Neutro Abs: 5.4 10*3/uL (ref 1.4–7.7)
PLATELETS: 364 10*3/uL (ref 150.0–400.0)
RBC: 4.83 Mil/uL (ref 3.87–5.11)
RDW: 13.2 % (ref 11.5–15.5)
WBC: 9.1 10*3/uL (ref 4.0–10.5)

## 2014-05-22 ENCOUNTER — Encounter: Payer: Self-pay | Admitting: Internal Medicine

## 2014-06-15 ENCOUNTER — Encounter: Payer: Self-pay | Admitting: Internal Medicine

## 2014-06-16 ENCOUNTER — Ambulatory Visit (INDEPENDENT_AMBULATORY_CARE_PROVIDER_SITE_OTHER): Payer: BLUE CROSS/BLUE SHIELD | Admitting: Internal Medicine

## 2014-06-16 ENCOUNTER — Encounter: Payer: Self-pay | Admitting: Internal Medicine

## 2014-06-16 ENCOUNTER — Telehealth: Payer: Self-pay | Admitting: *Deleted

## 2014-06-16 VITALS — BP 122/82 | HR 88 | Temp 98.2°F | Ht 63.0 in | Wt 150.2 lb

## 2014-06-16 DIAGNOSIS — I10 Essential (primary) hypertension: Secondary | ICD-10-CM

## 2014-06-16 DIAGNOSIS — N898 Other specified noninflammatory disorders of vagina: Secondary | ICD-10-CM | POA: Diagnosis not present

## 2014-06-16 DIAGNOSIS — E7800 Pure hypercholesterolemia, unspecified: Secondary | ICD-10-CM

## 2014-06-16 MED ORDER — CEPHALEXIN 500 MG PO CAPS: 500.0000 mg | ORAL_CAPSULE | Freq: Three times a day (TID) | ORAL | Status: DC

## 2014-06-16 MED ORDER — FLUCONAZOLE 150 MG PO TABS: 150.0000 mg | ORAL_TABLET | Freq: Once | ORAL | Status: DC

## 2014-06-16 NOTE — Telephone Encounter (Signed)
Pt may feel more comfortable seeing Dr. Nicki Reaper.  Will check with her and Morey Hummingbird, then advise.

## 2014-06-16 NOTE — Progress Notes (Signed)
Pre visit review using our clinic review tool, if applicable. No additional management support is needed unless otherwise documented below in the visit note. 

## 2014-06-16 NOTE — Progress Notes (Signed)
Patient ID: Rhonda Sanders, female   DOB: August 07, 1960, 54 y.o.   MRN: [id number]   Subjective:    Patient ID: Rhonda Sanders, female    DOB: 01/03/1961, 54 y.o.   MRN: [id number]  HPI  Patient here as a work in with concerns regarding a lesion on her labia.  Present since Sunday.  Some increased tenderness.  Does not appear to be getting bigger.  No fever.  No other lesions.     Past Medical History  Diagnosis Date  . Hypertension   . Hypercholesterolemia     Review of Systems  Constitutional: Negative for fever and chills.  Gastrointestinal: Negative for abdominal pain and diarrhea.  Genitourinary: Negative for dysuria and difficulty urinating.       "bump" outer labia.  Tender.  Not enlarging.  No vaginal discharge.         Objective:    Physical Exam  Constitutional: She appears well-developed and well-nourished. No distress.  Abdominal: Soft. There is no tenderness.  Genitourinary:  Small raised lesion outer labia.  Draining.  Continue to express pus out of the area.  She tolerated well.      BP 122/82 mmHg  Pulse 88  Temp(Src) 98.2 F (36.8 C) (Oral)  Ht 5\' 3"  (1.6 m)  Wt 150 lb 4 oz (68.153 kg)  BMI 26.62 kg/m2  SpO2 97% Wt Readings from Last 3 Encounters:  06/16/14 150 lb 4 oz (68.153 kg)  03/04/14 146 lb 8 oz (66.452 kg)  09/03/13 147 lb 12 oz (67.019 kg)     Lab Results  Component Value Date   WBC 9.1 05/18/2014   HGB 14.8 05/18/2014   HCT 43.3 05/18/2014   PLT 364.0 05/18/2014   GLUCOSE 94 03/17/2014   CHOL 221* 03/17/2014   TRIG 112.0 03/17/2014   HDL 58.80 03/17/2014   LDLDIRECT 172.5 03/14/2013   LDLCALC 140* 03/17/2014   ALT 29 03/17/2014   AST 30 03/17/2014   NA 137 04/06/2014   K 3.8 03/17/2014   CL 100 03/17/2014   CREATININE 0.8 03/17/2014   BUN 14 03/17/2014   CO2 30 03/17/2014   TSH 1.29 03/17/2014       Assessment & Plan:   Problem List Items Addressed This Visit    Hypercholesterolemia    Not on medication now.  Low  cholesterol diet.  Check lipid panel and liver function tests with next labs.       Relevant Orders   Lipid panel   Hepatic function panel   Hypertension   Relevant Orders   Basic metabolic panel   Vaginal cyst - Primary    Drained.  Tolerated well.  Keflex as directed.  Follow.  Notify me if does not resolve.          I spent 15 minutes with the patient and more than 50% of the time was spent in consultation regarding the above.     Einar Pheasant, MD

## 2014-06-16 NOTE — Patient Instructions (Signed)
Take a probiotic (align) - one per day while on the antibiotics and for two weeks after you complete the antibiotic.

## 2014-06-21 DIAGNOSIS — N898 Other specified noninflammatory disorders of vagina: Secondary | ICD-10-CM | POA: Insufficient documentation

## 2014-06-21 NOTE — Assessment & Plan Note (Signed)
Drained.  Tolerated well.  Keflex as directed.  Follow.  Notify me if does not resolve.

## 2014-06-21 NOTE — Assessment & Plan Note (Signed)
Not on medication now.  Low cholesterol diet.  Check lipid panel and liver function tests with next labs.

## 2014-09-24 ENCOUNTER — Other Ambulatory Visit (INDEPENDENT_AMBULATORY_CARE_PROVIDER_SITE_OTHER): Payer: BLUE CROSS/BLUE SHIELD

## 2014-09-24 ENCOUNTER — Encounter: Payer: Self-pay | Admitting: Internal Medicine

## 2014-09-24 DIAGNOSIS — E7800 Pure hypercholesterolemia, unspecified: Secondary | ICD-10-CM

## 2014-09-24 DIAGNOSIS — I10 Essential (primary) hypertension: Secondary | ICD-10-CM | POA: Diagnosis not present

## 2014-09-24 LAB — HEPATIC FUNCTION PANEL
ALT: 23 U/L (ref 0–35)
AST: 25 U/L (ref 0–37)
Albumin: 4.5 g/dL (ref 3.5–5.2)
Alkaline Phosphatase: 67 U/L (ref 39–117)
Bilirubin, Direct: 0.1 mg/dL (ref 0.0–0.3)
Total Bilirubin: 0.5 mg/dL (ref 0.2–1.2)
Total Protein: 7.5 g/dL (ref 6.0–8.3)

## 2014-09-24 LAB — BASIC METABOLIC PANEL
BUN: 22 mg/dL (ref 6–23)
CHLORIDE: 102 meq/L (ref 96–112)
CO2: 28 meq/L (ref 19–32)
Calcium: 10 mg/dL (ref 8.4–10.5)
Creatinine, Ser: 0.86 mg/dL (ref 0.40–1.20)
GFR: 73.6 mL/min (ref >60.00)
GLUCOSE: 90 mg/dL (ref 70–99)
POTASSIUM: 4 meq/L (ref 3.5–5.1)
Sodium: 139 mEq/L (ref 135–145)

## 2014-09-24 LAB — LIPID PANEL
CHOL/HDL RATIO: 4
CHOLESTEROL: 251 mg/dL — AB (ref 0–200)
HDL: 57.7 mg/dL (ref >39.00)
NonHDL: 193.3
TRIGLYCERIDES: 237 mg/dL — AB (ref 0.0–149.0)
VLDL: 47.4 mg/dL — AB (ref 0.0–40.0)

## 2014-09-24 LAB — LDL CHOLESTEROL, DIRECT: Direct LDL: 176 mg/dL

## 2014-10-01 ENCOUNTER — Ambulatory Visit (INDEPENDENT_AMBULATORY_CARE_PROVIDER_SITE_OTHER): Payer: BLUE CROSS/BLUE SHIELD | Admitting: Internal Medicine

## 2014-10-01 ENCOUNTER — Encounter: Payer: Self-pay | Admitting: Internal Medicine

## 2014-10-01 VITALS — BP 110/80 | HR 88 | Temp 98.3°F | Ht 63.0 in | Wt 150.0 lb

## 2014-10-01 DIAGNOSIS — R920 Mammographic microcalcification found on diagnostic imaging of breast: Secondary | ICD-10-CM | POA: Diagnosis not present

## 2014-10-01 DIAGNOSIS — Z0000 Encounter for general adult medical examination without abnormal findings: Secondary | ICD-10-CM

## 2014-10-01 DIAGNOSIS — E7800 Pure hypercholesterolemia, unspecified: Secondary | ICD-10-CM

## 2014-10-01 DIAGNOSIS — I10 Essential (primary) hypertension: Secondary | ICD-10-CM | POA: Diagnosis not present

## 2014-10-01 DIAGNOSIS — R748 Abnormal levels of other serum enzymes: Secondary | ICD-10-CM

## 2014-10-01 MED ORDER — ROSUVASTATIN CALCIUM 5 MG PO TABS: 5.0000 mg | ORAL_TABLET | Freq: Every day | ORAL | Status: DC

## 2014-10-01 NOTE — Progress Notes (Signed)
Pre visit review using our clinic review tool, if applicable. No additional management support is needed unless otherwise documented below in the visit note. 

## 2014-10-02 ENCOUNTER — Encounter: Payer: Self-pay | Admitting: Internal Medicine

## 2014-10-02 ENCOUNTER — Telehealth: Payer: Self-pay | Admitting: *Deleted

## 2014-10-02 NOTE — Telephone Encounter (Signed)
PA for Crestor 5mg  initiated online & additional supporting documentation was faxed.   Key# [id number]

## 2014-10-03 ENCOUNTER — Encounter: Payer: Self-pay | Admitting: Internal Medicine

## 2014-10-03 DIAGNOSIS — Z0000 Encounter for general adult medical examination without abnormal findings: Secondary | ICD-10-CM | POA: Insufficient documentation

## 2014-10-03 NOTE — Assessment & Plan Note (Signed)
Physical 03/04/14.  PAP 03/04/14 - negative with negative HPV.  Mammogram 03/24/2014 - birads II.

## 2014-10-03 NOTE — Assessment & Plan Note (Signed)
Biopsy negative 03/17/13.  F/u bilateral mammogram 03/24/14 - Birads II.  Recommended f/u mammogram in one year - 03/2015.

## 2014-10-03 NOTE — Progress Notes (Signed)
Patient ID: Rhonda Sanders, female   DOB: [birth year], 54 y.o.   MRN: [id number]   Subjective:    Patient ID: Rhonda Sanders, female    DOB: Feb 11, 1961, 54 y.o.   MRN: [id number]  HPI  Patient here for a scheduled follow up.  Cholesterol elevated.  Discussed treatment options.  Had problems with elevated LFTs previously while on pravastatin.  Has tried diet and exercise.  Still elevated cholesterol.  No cardiac symptoms with increased activity or exertion.  No sob.  Bowels stable.     Past Medical History  Diagnosis Date  . Hypertension   . Hypercholesterolemia     Outpatient Encounter Prescriptions as of 10/01/2014  Medication Sig  . cetirizine (ZYRTEC) 10 MG tablet Take 10 mg by mouth daily.  . fluconazole (DIFLUCAN) 150 MG tablet Take 1 tablet (150 mg total) by mouth once.  . hydrochlorothiazide (HYDRODIURIL) 25 MG tablet Take 1 tablet (25 mg total) by mouth daily.  Marland Kitchen lisinopril (PRINIVIL,ZESTRIL) 30 MG tablet Take 1 tablet (30 mg total) by mouth daily.  . Magnesium 500 MG TABS Take by mouth daily.  . montelukast (SINGULAIR) 10 MG tablet Take 1 tablet (10 mg total) by mouth at bedtime.  . Multiple Vitamin (MULTIVITAMIN) tablet Take 1 tablet by mouth daily.  . [DISCONTINUED] cephALEXin (KEFLEX) 500 MG capsule Take 1 capsule (500 mg total) by mouth 3 (three) times daily.  . [DISCONTINUED] pravastatin (PRAVACHOL) 20 MG tablet Take 1 tablet (20 mg total) by mouth daily.  . rosuvastatin (CRESTOR) 5 MG tablet Take 1 tablet (5 mg total) by mouth daily.   No facility-administered encounter medications on file as of 10/01/2014.    Review of Systems  Constitutional: Negative for appetite change and unexpected weight change.  HENT: Negative for congestion and sinus pressure.   Respiratory: Negative for cough, chest tightness and shortness of breath.   Cardiovascular: Negative for chest pain, palpitations and leg swelling.  Gastrointestinal: Negative for nausea, vomiting, abdominal pain and  diarrhea.  Neurological: Negative for dizziness, light-headedness and headaches.  Psychiatric/Behavioral: Negative for dysphoric mood and agitation.       Objective:    Physical Exam  Constitutional: She appears well-developed and well-nourished. No distress.  HENT:  Nose: Nose normal.  Mouth/Throat: Oropharynx is clear and moist.  Neck: Neck supple. No thyromegaly present.  Cardiovascular: Normal rate and regular rhythm.   Pulmonary/Chest: Breath sounds normal. No respiratory distress. She has no wheezes.  Abdominal: Soft. Bowel sounds are normal. There is no tenderness.  Musculoskeletal: She exhibits no edema or tenderness.  Lymphadenopathy:    She has no cervical adenopathy.  Skin: No rash noted. No erythema.  Psychiatric: She has a normal mood and affect. Her behavior is normal.    BP 110/80 mmHg  Pulse 88  Temp(Src) 98.3 F (36.8 C) (Oral)  Ht 5\' 3"  (1.6 m)  Wt 150 lb (68.04 kg)  BMI 26.58 kg/m2  SpO2 97% Wt Readings from Last 3 Encounters:  10/01/14 150 lb (68.04 kg)  06/16/14 150 lb 4 oz (68.153 kg)  03/04/14 146 lb 8 oz (66.452 kg)     Lab Results  Component Value Date   WBC 9.1 05/18/2014   HGB 14.8 05/18/2014   HCT 43.3 05/18/2014   PLT 364.0 05/18/2014   GLUCOSE 90 09/24/2014   CHOL 251* 09/24/2014   TRIG 237.0* 09/24/2014   HDL 57.70 09/24/2014   LDLDIRECT 176.0 09/24/2014   LDLCALC 140* 03/17/2014   ALT 23 09/24/2014   AST  25 09/24/2014   NA 139 09/24/2014   K 4.0 09/24/2014   CL 102 09/24/2014   CREATININE 0.86 09/24/2014   BUN 22 09/24/2014   CO2 28 09/24/2014   TSH 1.29 03/17/2014       Assessment & Plan:   Problem List Items Addressed This Visit    Abnormal liver enzymes    Liver function tests wnl now.  Abdominal ultrasound revealed no fatty liver.  Off cholesterol medication and hctz.  Discussed cholesterol treatment.  Discussed wellchol.  She wants to try crestor.  Will start low dose and follow liver panel closely.   Check liver  panel in 2-3 weeks.        Breast microcalcification, mammographic    Biopsy negative 03/17/13.  F/u bilateral mammogram 03/24/14 - Birads II.  Recommended f/u mammogram in one year - 03/2015.        Health care maintenance    Physical 03/04/14.  PAP 03/04/14 - negative with negative HPV.  Mammogram 03/24/2014 - birads II.       Hypercholesterolemia - Primary    Low cholesterol diet and exercise.  Still with elevated cholesterol.  Discussed treatment options.  She wants to try low dose crestor.  Understands possible risk of elevated liver enzymes.  Will monitor closely.  Check liver panel in 2-3 weeks.        Relevant Medications   rosuvastatin (CRESTOR) 5 MG tablet   Other Relevant Orders   Hepatic function panel   Hypertension    Blood pressure under good control.  Continue same medication regimen.  Follow pressures.  Follow metabolic panel.         Relevant Medications   rosuvastatin (CRESTOR) 5 MG tablet     I spent 25 minutes with the patient and more than 50% of the time was spent in consultation regarding the above.     Einar Pheasant, MD

## 2014-10-03 NOTE — Assessment & Plan Note (Signed)
Blood pressure under good control.  Continue same medication regimen.  Follow pressures.  Follow metabolic panel.   

## 2014-10-03 NOTE — Assessment & Plan Note (Signed)
Liver function tests wnl now.  Abdominal ultrasound revealed no fatty liver.  Off cholesterol medication and hctz.  Discussed cholesterol treatment.  Discussed wellchol.  She wants to try crestor.  Will start low dose and follow liver panel closely.   Check liver panel in 2-3 weeks.

## 2014-10-03 NOTE — Assessment & Plan Note (Signed)
Low cholesterol diet and exercise.  Still with elevated cholesterol.  Discussed treatment options.  She wants to try low dose crestor.  Understands possible risk of elevated liver enzymes.  Will monitor closely.  Check liver panel in 2-3 weeks.

## 2014-10-22 ENCOUNTER — Other Ambulatory Visit: Payer: BLUE CROSS/BLUE SHIELD

## 2014-10-23 ENCOUNTER — Encounter: Payer: Self-pay | Admitting: Internal Medicine

## 2014-10-30 ENCOUNTER — Other Ambulatory Visit (INDEPENDENT_AMBULATORY_CARE_PROVIDER_SITE_OTHER): Payer: BLUE CROSS/BLUE SHIELD

## 2014-10-30 DIAGNOSIS — E7800 Pure hypercholesterolemia, unspecified: Secondary | ICD-10-CM

## 2014-10-30 LAB — HEPATIC FUNCTION PANEL
ALBUMIN: 4.6 g/dL (ref 3.6–5.1)
ALT: 28 U/L (ref 6–29)
AST: 27 U/L (ref 10–35)
Alkaline Phosphatase: 65 U/L (ref 33–130)
BILIRUBIN TOTAL: 0.6 mg/dL (ref 0.2–1.2)
Bilirubin, Direct: 0.1 mg/dL (ref <=0.2)
Indirect Bilirubin: 0.5 mg/dL (ref 0.2–1.2)
TOTAL PROTEIN: 7.1 g/dL (ref 6.1–8.1)

## 2014-10-31 ENCOUNTER — Other Ambulatory Visit: Payer: Self-pay | Admitting: Internal Medicine

## 2014-10-31 DIAGNOSIS — E7800 Pure hypercholesterolemia, unspecified: Secondary | ICD-10-CM

## 2014-10-31 NOTE — Progress Notes (Signed)
Order placed for f/u liver panel.  

## 2014-11-04 ENCOUNTER — Encounter: Payer: Self-pay | Admitting: Internal Medicine

## 2014-11-23 ENCOUNTER — Other Ambulatory Visit: Payer: BLUE CROSS/BLUE SHIELD

## 2014-11-27 ENCOUNTER — Other Ambulatory Visit (INDEPENDENT_AMBULATORY_CARE_PROVIDER_SITE_OTHER): Payer: BLUE CROSS/BLUE SHIELD

## 2014-11-27 DIAGNOSIS — E7800 Pure hypercholesterolemia, unspecified: Secondary | ICD-10-CM

## 2014-11-27 LAB — HEPATIC FUNCTION PANEL
ALBUMIN: 4.3 g/dL (ref 3.6–5.1)
ALT: 23 U/L (ref 6–29)
AST: 22 U/L (ref 10–35)
Alkaline Phosphatase: 70 U/L (ref 33–130)
BILIRUBIN INDIRECT: 0.3 mg/dL (ref 0.2–1.2)
Bilirubin, Direct: 0.1 mg/dL (ref <=0.2)
TOTAL PROTEIN: 7 g/dL (ref 6.1–8.1)
Total Bilirubin: 0.4 mg/dL (ref 0.2–1.2)

## 2014-11-28 ENCOUNTER — Encounter: Payer: Self-pay | Admitting: Internal Medicine

## 2014-12-29 ENCOUNTER — Other Ambulatory Visit (INDEPENDENT_AMBULATORY_CARE_PROVIDER_SITE_OTHER): Payer: BLUE CROSS/BLUE SHIELD

## 2014-12-29 ENCOUNTER — Telehealth: Payer: Self-pay | Admitting: *Deleted

## 2014-12-29 DIAGNOSIS — I10 Essential (primary) hypertension: Secondary | ICD-10-CM | POA: Diagnosis not present

## 2014-12-29 DIAGNOSIS — E7800 Pure hypercholesterolemia, unspecified: Secondary | ICD-10-CM

## 2014-12-29 DIAGNOSIS — R748 Abnormal levels of other serum enzymes: Secondary | ICD-10-CM | POA: Diagnosis not present

## 2014-12-29 LAB — HEPATIC FUNCTION PANEL
ALBUMIN: 4.5 g/dL (ref 3.5–5.2)
ALT: 26 U/L (ref 0–35)
AST: 27 U/L (ref 0–37)
Alkaline Phosphatase: 68 U/L (ref 39–117)
BILIRUBIN DIRECT: 0.1 mg/dL (ref 0.0–0.3)
TOTAL PROTEIN: 7.2 g/dL (ref 6.0–8.3)
Total Bilirubin: 0.5 mg/dL (ref 0.2–1.2)

## 2014-12-29 LAB — LIPID PANEL
Cholesterol: 184 mg/dL (ref 0–200)
HDL: 52.7 mg/dL (ref >39.00)
LDL Cholesterol: 107 mg/dL — ABNORMAL HIGH (ref 0–99)
NonHDL: 130.99
Total CHOL/HDL Ratio: 3
Triglycerides: 118 mg/dL (ref 0.0–149.0)
VLDL: 23.6 mg/dL (ref 0.0–40.0)

## 2014-12-29 LAB — BASIC METABOLIC PANEL
BUN: 23 mg/dL (ref 6–23)
CO2: 32 mEq/L (ref 19–32)
Calcium: 10 mg/dL (ref 8.4–10.5)
Chloride: 103 mEq/L (ref 96–112)
Creatinine, Ser: 0.95 mg/dL (ref 0.40–1.20)
GFR: 65.55 mL/min (ref >60.00)
Glucose, Bld: 91 mg/dL (ref 70–99)
Potassium: 4.7 mEq/L (ref 3.5–5.1)
Sodium: 141 mEq/L (ref 135–145)

## 2014-12-29 NOTE — Telephone Encounter (Signed)
Labs and dx?  

## 2014-12-29 NOTE — Telephone Encounter (Signed)
Order placed for f/u labs.  

## 2014-12-29 NOTE — Telephone Encounter (Signed)
I have placed the order for her labs.  Thanks

## 2014-12-30 ENCOUNTER — Encounter: Payer: Self-pay | Admitting: *Deleted

## 2014-12-30 ENCOUNTER — Other Ambulatory Visit: Payer: Self-pay | Admitting: Internal Medicine

## 2014-12-30 DIAGNOSIS — E7800 Pure hypercholesterolemia, unspecified: Secondary | ICD-10-CM

## 2014-12-30 NOTE — Progress Notes (Signed)
Order placed for f/u liver panel.  

## 2015-01-12 ENCOUNTER — Other Ambulatory Visit: Payer: Self-pay | Admitting: Internal Medicine

## 2015-01-13 ENCOUNTER — Other Ambulatory Visit: Payer: Self-pay | Admitting: Internal Medicine

## 2015-02-10 ENCOUNTER — Other Ambulatory Visit: Payer: BLUE CROSS/BLUE SHIELD

## 2015-02-24 ENCOUNTER — Other Ambulatory Visit: Payer: BLUE CROSS/BLUE SHIELD

## 2015-03-04 ENCOUNTER — Encounter: Payer: Self-pay | Admitting: Internal Medicine

## 2015-03-04 ENCOUNTER — Other Ambulatory Visit (INDEPENDENT_AMBULATORY_CARE_PROVIDER_SITE_OTHER): Payer: BLUE CROSS/BLUE SHIELD

## 2015-03-04 DIAGNOSIS — E7800 Pure hypercholesterolemia, unspecified: Secondary | ICD-10-CM | POA: Diagnosis not present

## 2015-03-04 LAB — HEPATIC FUNCTION PANEL
ALT: 26 U/L (ref 0–35)
AST: 23 U/L (ref 0–37)
Albumin: 4.4 g/dL (ref 3.5–5.2)
Alkaline Phosphatase: 72 U/L (ref 39–117)
BILIRUBIN TOTAL: 0.4 mg/dL (ref 0.2–1.2)
Bilirubin, Direct: 0.1 mg/dL (ref 0.0–0.3)
TOTAL PROTEIN: 7.3 g/dL (ref 6.0–8.3)

## 2015-03-05 ENCOUNTER — Other Ambulatory Visit: Payer: BLUE CROSS/BLUE SHIELD

## 2015-03-12 ENCOUNTER — Ambulatory Visit (INDEPENDENT_AMBULATORY_CARE_PROVIDER_SITE_OTHER): Payer: BLUE CROSS/BLUE SHIELD | Admitting: Internal Medicine

## 2015-03-12 ENCOUNTER — Encounter: Payer: Self-pay | Admitting: Internal Medicine

## 2015-03-12 VITALS — BP 128/80 | HR 88 | Temp 97.9°F | Resp 18 | Ht 63.0 in | Wt 152.5 lb

## 2015-03-12 DIAGNOSIS — Z1239 Encounter for other screening for malignant neoplasm of breast: Secondary | ICD-10-CM

## 2015-03-12 DIAGNOSIS — E7800 Pure hypercholesterolemia, unspecified: Secondary | ICD-10-CM

## 2015-03-12 DIAGNOSIS — R748 Abnormal levels of other serum enzymes: Secondary | ICD-10-CM

## 2015-03-12 DIAGNOSIS — Z1211 Encounter for screening for malignant neoplasm of colon: Secondary | ICD-10-CM

## 2015-03-12 DIAGNOSIS — I10 Essential (primary) hypertension: Secondary | ICD-10-CM

## 2015-03-12 DIAGNOSIS — Z9189 Other specified personal risk factors, not elsewhere classified: Secondary | ICD-10-CM

## 2015-03-12 DIAGNOSIS — Z0000 Encounter for general adult medical examination without abnormal findings: Secondary | ICD-10-CM | POA: Diagnosis not present

## 2015-03-12 DIAGNOSIS — Z87898 Personal history of other specified conditions: Secondary | ICD-10-CM

## 2015-03-12 NOTE — Progress Notes (Signed)
Patient ID: Rhonda Sanders, female   DOB: 1960/05/09, 55 y.o.   MRN: [id number]   Subjective:    Patient ID: Rhonda Sanders, female    DOB: 1960/06/24, 55 y.o.   MRN: [id number]  HPI  Patient with past history of hypercholesterolemia and hypertension.  She comes in today to follow up on these issues as well as for a complete physical exam.  She reports she feels good.  Not exercising as much.  Plans to get back in her routine with exercise.  We discussed diet and exercise.  No chest pain or tightness with increased activity or exertion.  No sob.  No abdominal pain or cramping.  Bowels stable.  Discussed the need for colonoscopy.  She was agreeable.  Work going well.  Some increased stress with work, but doing well.     Past Medical History  Diagnosis Date  . Hypertension   . Hypercholesterolemia    Past Surgical History  Procedure Laterality Date  . Abdominal hysterectomy  04/2006    partial  . Breast lumpectomy Right 12-2008    biopsy, columnar cell hyperplasia   Family History  Problem Relation Age of Onset  . Heart disease Father     first MI (70s), died age 77 - MI  . Breast cancer Mother 44  . Heart disease Paternal Grandfather   . Heart disease Paternal Uncle   . Breast cancer Paternal Grandmother   . Breast cancer Maternal Aunt   . CVA Paternal Grandmother   . Crohn's disease Brother   . Colon cancer Neg Hx    Social History   Social History  . Marital Status: Single    Spouse Name: N/A  . Number of Children: N/A  . Years of Education: N/A   Social History Main Topics  . Smoking status: Never Smoker   . Smokeless tobacco: Never Used  . Alcohol Use: 0.0 oz/week    0 Standard drinks or equivalent per week     Comment: occasional  . Drug Use: No  . Sexual Activity: Not Asked   Other Topics Concern  . None   Social History Narrative    Outpatient Encounter Prescriptions as of 03/12/2015  Medication Sig  . cetirizine (ZYRTEC) 10 MG tablet Take 10 mg by mouth  daily.  . hydrochlorothiazide (HYDRODIURIL) 25 MG tablet Take 1 tablet (25 mg total) by mouth daily.  Marland Kitchen lisinopril (PRINIVIL,ZESTRIL) 30 MG tablet Take 1 tablet (30 mg total) by mouth daily.  . Magnesium 500 MG TABS Take by mouth daily.  . montelukast (SINGULAIR) 10 MG tablet Take 1 tablet (10 mg total) by mouth at bedtime.  . Multiple Vitamin (MULTIVITAMIN) tablet Take 1 tablet by mouth daily.  . rosuvastatin (CRESTOR) 5 MG tablet TAKE 1 TABLET BY MOUTH DAILY  . [DISCONTINUED] fluconazole (DIFLUCAN) 150 MG tablet Take 1 tablet (150 mg total) by mouth once.   No facility-administered encounter medications on file as of 03/12/2015.    Review of Systems  Constitutional: Negative for appetite change and unexpected weight change.  HENT: Negative for congestion and sinus pressure.   Eyes: Negative for pain and visual disturbance.  Respiratory: Negative for cough, chest tightness and shortness of breath.   Cardiovascular: Negative for chest pain, palpitations and leg swelling.  Gastrointestinal: Negative for nausea, vomiting, abdominal pain and diarrhea.  Genitourinary: Negative for dysuria and difficulty urinating.  Musculoskeletal: Negative for back pain and joint swelling.  Skin: Negative for color change and rash.  Neurological: Negative for  dizziness, light-headedness and headaches.  Hematological: Negative for adenopathy. Does not bruise/bleed easily.  Psychiatric/Behavioral: Negative for dysphoric mood and agitation.       Objective:    Physical Exam  Constitutional: She is oriented to person, place, and time. She appears well-developed and well-nourished. No distress.  HENT:  Nose: Nose normal.  Mouth/Throat: Oropharynx is clear and moist.  Eyes: Right eye exhibits no discharge. Left eye exhibits no discharge. No scleral icterus.  Neck: Neck supple. No thyromegaly present.  Cardiovascular: Normal rate and regular rhythm.   Pulmonary/Chest: Breath sounds normal. No accessory  muscle usage. No tachypnea. No respiratory distress. She has no decreased breath sounds. She has no wheezes. She has no rhonchi. Right breast exhibits no inverted nipple, no mass, no nipple discharge and no tenderness (no axillary adenopathy). Left breast exhibits no inverted nipple, no mass, no nipple discharge and no tenderness (no axilarry adenopathy).  Abdominal: Soft. Bowel sounds are normal. There is no tenderness.  Musculoskeletal: She exhibits no edema or tenderness.  Lymphadenopathy:    She has no cervical adenopathy.  Neurological: She is alert and oriented to person, place, and time.  Skin: Skin is warm. No rash noted. No erythema.  Psychiatric: She has a normal mood and affect. Her behavior is normal.    BP 128/80 mmHg  Pulse 88  Temp(Src) 97.9 F (36.6 C) (Oral)  Resp 18  Ht 5\' 3"  (1.6 m)  Wt 152 lb 8 oz (69.174 kg)  BMI 27.02 kg/m2  SpO2 99% Wt Readings from Last 3 Encounters:  03/12/15 152 lb 8 oz (69.174 kg)  10/01/14 150 lb (68.04 kg)  06/16/14 150 lb 4 oz (68.153 kg)     Lab Results  Component Value Date   WBC 9.1 05/18/2014   HGB 14.8 05/18/2014   HCT 43.3 05/18/2014   PLT 364.0 05/18/2014   GLUCOSE 91 12/29/2014   CHOL 184 12/29/2014   TRIG 118.0 12/29/2014   HDL 52.70 12/29/2014   LDLDIRECT 176.0 09/24/2014   LDLCALC 107* 12/29/2014   ALT 26 03/04/2015   AST 23 03/04/2015   NA 141 12/29/2014   K 4.7 12/29/2014   CL 103 12/29/2014   CREATININE 0.95 12/29/2014   BUN 23 12/29/2014   CO2 32 12/29/2014   TSH 1.29 03/17/2014       Assessment & Plan:   Problem List Items Addressed This Visit    Abnormal liver enzymes    Abdominal ultrasound did not reveal fatty liver.  Off hctz.  Back on cholesterol medication - crestor now.  Recent liver panel wnl.  Follow closely.  Recheck with next labs.        Relevant Orders   TSH   Health care maintenance    Physical today - 03/12/15.  PAP 03/04/14 - negative with negative HPV.  Mammogram 03/23/14 - Birads  II.  Schedule f/u mammogram.  Discussed colon cancer screening.  Refer to GI for colonoscopy.       History of abnormal mammogram    Previous lumpectomy.  Saw Dr Bary Castilla.  Refer to his note and my previous note for details.  Mammogram 03/23/14 - Birads I.  Recommended yearly f/u.  Schedule f/u mammogram.       Hypercholesterolemia    On crestor now.  Recent liver panel wnl.  Cholesterol improved - LDL 107.  Triglycerides 118.  Continue crestor.  Follow lipid panel and liver function tests.  Low cholesterol diet and exercise.        Relevant  Orders   Lipid panel   Hepatic function panel   Hypertension    Blood pressure under good control.  Continue same medication regimen.  Follow pressures.  Follow metabolic panel.        Relevant Orders   CBC with Differential/Platelet   Basic metabolic panel    Other Visit Diagnoses    Colon cancer screening    -  Primary    Relevant Orders    Ambulatory referral to General Surgery    Breast cancer screening        Relevant Orders    MM DIGITAL SCREENING BILATERAL        Einar Pheasant, MD

## 2015-03-12 NOTE — Progress Notes (Signed)
Pre-visit discussion using our clinic review tool. No additional management support is needed unless otherwise documented below in the visit note.  

## 2015-03-14 ENCOUNTER — Encounter: Payer: Self-pay | Admitting: Internal Medicine

## 2015-03-14 NOTE — Assessment & Plan Note (Addendum)
Physical today - 03/12/15.  PAP 03/04/14 - negative with negative HPV.  Mammogram 03/23/14 - Birads II.  Schedule f/u mammogram.  Discussed colon cancer screening.  Refer to GI for colonoscopy.

## 2015-03-14 NOTE — Assessment & Plan Note (Signed)
Abdominal ultrasound did not reveal fatty liver.  Off hctz.  Back on cholesterol medication - crestor now.  Recent liver panel wnl.  Follow closely.  Recheck with next labs.

## 2015-03-14 NOTE — Assessment & Plan Note (Signed)
Blood pressure under good control.  Continue same medication regimen.  Follow pressures.  Follow metabolic panel.   

## 2015-03-14 NOTE — Assessment & Plan Note (Signed)
Previous lumpectomy.  Saw Dr Bary Castilla.  Refer to his note and my previous note for details.  Mammogram 03/23/14 - Birads I.  Recommended yearly f/u.  Schedule f/u mammogram.

## 2015-03-14 NOTE — Assessment & Plan Note (Signed)
On crestor now.  Recent liver panel wnl.  Cholesterol improved - LDL 107.  Triglycerides 118.  Continue crestor.  Follow lipid panel and liver function tests.  Low cholesterol diet and exercise.

## 2015-03-16 ENCOUNTER — Encounter: Payer: Self-pay | Admitting: *Deleted

## 2015-03-25 ENCOUNTER — Ambulatory Visit
Admission: RE | Admit: 2015-03-25 | Discharge: 2015-03-25 | Disposition: A | Discharge status: Home / Self Care | Payer: BLUE CROSS/BLUE SHIELD | Source: Ambulatory Visit | Attending: Internal Medicine | Admitting: Internal Medicine

## 2015-03-25 DIAGNOSIS — Z1231 Encounter for screening mammogram for malignant neoplasm of breast: Secondary | ICD-10-CM | POA: Diagnosis present

## 2015-03-30 ENCOUNTER — Ambulatory Visit
Admission: RE | Admit: 2015-03-30 | Discharge: 2015-03-30 | Disposition: A | Discharge status: Home / Self Care | Payer: BLUE CROSS/BLUE SHIELD | Source: Ambulatory Visit | Attending: Internal Medicine | Admitting: Internal Medicine

## 2015-03-30 DIAGNOSIS — Z1239 Encounter for other screening for malignant neoplasm of breast: Secondary | ICD-10-CM

## 2015-03-31 ENCOUNTER — Ambulatory Visit: Payer: Self-pay | Admitting: General Surgery

## 2015-04-06 ENCOUNTER — Ambulatory Visit: Payer: Self-pay | Admitting: General Surgery

## 2015-04-12 ENCOUNTER — Encounter: Payer: Self-pay | Admitting: General Surgery

## 2015-04-12 ENCOUNTER — Ambulatory Visit (INDEPENDENT_AMBULATORY_CARE_PROVIDER_SITE_OTHER): Payer: BLUE CROSS/BLUE SHIELD | Admitting: General Surgery

## 2015-04-12 VITALS — BP 122/80 | HR 72 | Resp 12 | Ht 63.0 in | Wt 154.0 lb

## 2015-04-12 DIAGNOSIS — Z1211 Encounter for screening for malignant neoplasm of colon: Secondary | ICD-10-CM | POA: Diagnosis not present

## 2015-04-12 MED ORDER — POLYETHYLENE GLYCOL 3350 17 GM/SCOOP PO POWD: ORAL | Status: DC

## 2015-04-12 NOTE — Progress Notes (Signed)
Patient ID: Rhonda Sanders, female   DOB: [birth year], 55 y.o.   MRN: [id number]  Chief Complaint  Patient presents with  . Colonoscopy    HPI Rhonda Sanders is a 55 y.o. female here for a colonoscopy discussion. She has not had one done in the past.  Patient has had trouble with hemorrhoids in the past.   HPI  Past Medical History  Diagnosis Date  . Hypertension   . Hypercholesterolemia     Past Surgical History  Procedure Laterality Date  . Abdominal hysterectomy  04/2006    partial  . Breast lumpectomy Right 12-2008    biopsy, columnar cell hyperplasia  . Breast biopsy Right 2010    EXCISIONAL - NEG  . Breast biopsy Right 2014    CORE W/CLIP - NEG  . Breast biopsy Right 2015    CORE W/OUT CLIP - NEG    Family History  Problem Relation Age of Onset  . Heart disease Father     first MI (31s), died age 39 - MI  . Breast cancer Mother 59  . Heart disease Paternal Grandfather   . Heart disease Paternal Uncle   . Breast cancer Paternal Grandmother   . Breast cancer Maternal Aunt   . CVA Paternal Grandmother   . Crohn's disease Brother   . Colon cancer Neg Hx     Social History Social History  Substance Use Topics  . Smoking status: Never Smoker   . Smokeless tobacco: Never Used  . Alcohol Use: 0.0 oz/week    0 Standard drinks or equivalent per week     Comment: occasional    No Known Allergies  Current Outpatient Prescriptions  Medication Sig Dispense Refill  . hydrochlorothiazide (HYDRODIURIL) 25 MG tablet Take 1 tablet (25 mg total) by mouth daily. 90 tablet 3  . lisinopril (PRINIVIL,ZESTRIL) 30 MG tablet Take 1 tablet (30 mg total) by mouth daily. 90 tablet 3  . montelukast (SINGULAIR) 10 MG tablet Take 1 tablet (10 mg total) by mouth at bedtime. 90 tablet 3  . rosuvastatin (CRESTOR) 5 MG tablet TAKE 1 TABLET BY MOUTH DAILY 30 tablet 0  . cetirizine (ZYRTEC) 10 MG tablet Take 10 mg by mouth daily.    . Magnesium 500 MG TABS Take by mouth daily.    . Multiple  Vitamin (MULTIVITAMIN) tablet Take 1 tablet by mouth daily.    . polyethylene glycol powder (GLYCOLAX/MIRALAX) powder 255 grams one bottle for colonoscopy prep 255 g 0   No current facility-administered medications for this visit.    Review of Systems Review of Systems  Constitutional: Negative.   Respiratory: Negative.   Cardiovascular: Negative.   Gastrointestinal: Negative.     Blood pressure 122/80, pulse 72, resp. rate 12, height 5\' 3"  (1.6 m), weight 154 lb (69.854 kg).  Physical Exam Physical Exam  Constitutional: She is oriented to person, place, and time. She appears well-developed and well-nourished.  Cardiovascular: Normal rate, regular rhythm and normal heart sounds.   Pulmonary/Chest: Effort normal.  Neurological: She is alert and oriented to person, place, and time.  Skin: Skin is warm and dry.    Data Reviewed Bilateral mammograms from 03/25/2015 were reviewed. Dense breasts. No interval change. BI-RADS-2.  2010 biopsy showed a single microscopic foci of ADH. 2015 biopsy for microcalcification showed no atypia.There is no record of a biopsy being completed in 2014 within the [facility].   Baker Janus model risk: 6.2%, 5 year; 39.5% lifetime.     Assessment  Candidate for screening colonoscopy.  Increased risk for breast cancer based on past medical history and previous biopsy showing ADH.    Plan    A message was left by phone to clarify the patient's 2014 biopsy report. We'll review options for chemoprevention in the future.    Colonoscopy with possible biopsy/polypectomy prn: Information regarding the procedure, including its potential risks and complications (including but not limited to perforation of the bowel, which may require emergency surgery to repair, and bleeding) was verbally given to the patient. Educational information regarding lower intestinal endoscopy was given to the patient. Written instructions for how to complete the bowel prep using  Miralax were provided. The importance of drinking ample fluids to avoid dehydration as a result of the prep emphasized.  Patient has been scheduled for a colonoscopy on 05-26-15 at [facility].   This has been scribed by Lesly Rubenstein LPN  PCP:  Nash Shearer 04/13/2015, 8:59 AM

## 2015-04-12 NOTE — Patient Instructions (Addendum)
Colonoscopy A colonoscopy is an exam to look at the entire large intestine (colon). This exam can help find problems such as tumors, polyps, inflammation, and areas of bleeding. The exam takes about 1 hour.  LET [facility] CARE PROVIDER KNOW ABOUT:   Any allergies you have.  All medicines you are taking, including vitamins, herbs, eye drops, creams, and over-the-counter medicines.  Previous problems you or members of your family have had with the use of anesthetics.  Any blood disorders you have.  Previous surgeries you have had.  Medical conditions you have. RISKS AND COMPLICATIONS  Generally, this is a safe procedure. However, as with any procedure, complications can occur. Possible complications include:  Bleeding.  Tearing or rupture of the colon wall.  Reaction to medicines given during the exam.  Infection (rare). BEFORE THE PROCEDURE   Ask your health care provider about changing or stopping your regular medicines.  You may be prescribed an oral bowel prep. This involves drinking a large amount of medicated liquid, starting the day before your procedure. The liquid will cause you to have multiple loose stools until your stool is almost clear or light green. This cleans out your colon in preparation for the procedure.  Do not eat or drink anything else once you have started the bowel prep, unless your health care provider tells you it is safe to do so.  Arrange for someone to drive you home after the procedure. PROCEDURE   You will be given medicine to help you relax (sedative).  You will lie on your side with your knees bent.  A long, flexible tube with a light and camera on the end (colonoscope) will be inserted through the rectum and into the colon. The camera sends video back to a computer screen as it moves through the colon. The colonoscope also releases carbon dioxide gas to inflate the colon. This helps your health care provider see the area better.  During  the exam, your health care provider may take a small tissue sample (biopsy) to be examined under a microscope if any abnormalities are found.  The exam is finished when the entire colon has been viewed. AFTER THE PROCEDURE   Do not drive for 24 hours after the exam.  You may have a small amount of blood in your stool.  You may pass moderate amounts of gas and have mild abdominal cramping or bloating. This is caused by the gas used to inflate your colon during the exam.  Ask when your test results will be ready and how you will get your results. Make sure you get your test results.   This information is not intended to replace advice given to you by your health care provider. Make sure you discuss any questions you have with your health care provider.   Document Released: 02/18/2000 Document Revised: 12/11/2012 Document Reviewed: 10/28/2012 Elsevier Interactive Patient Education [employer].  Patient has been scheduled for a colonoscopy on 05-26-15 at [facility].

## 2015-04-13 ENCOUNTER — Telehealth: Payer: Self-pay | Admitting: *Deleted

## 2015-04-13 DIAGNOSIS — Z1211 Encounter for screening for malignant neoplasm of colon: Secondary | ICD-10-CM | POA: Insufficient documentation

## 2015-04-13 NOTE — Telephone Encounter (Signed)
Patient got your message and just wanted to let you know that she did not have a breast biopsy in 2014

## 2015-04-29 ENCOUNTER — Other Ambulatory Visit: Payer: Self-pay | Admitting: Internal Medicine

## 2015-04-30 NOTE — Telephone Encounter (Signed)
Medication filled to pharmacy as requested.   

## 2015-05-12 ENCOUNTER — Other Ambulatory Visit (INDEPENDENT_AMBULATORY_CARE_PROVIDER_SITE_OTHER): Payer: BLUE CROSS/BLUE SHIELD

## 2015-05-12 DIAGNOSIS — R748 Abnormal levels of other serum enzymes: Secondary | ICD-10-CM

## 2015-05-12 DIAGNOSIS — I10 Essential (primary) hypertension: Secondary | ICD-10-CM

## 2015-05-12 DIAGNOSIS — E7800 Pure hypercholesterolemia, unspecified: Secondary | ICD-10-CM | POA: Diagnosis not present

## 2015-05-12 LAB — LIPID PANEL
CHOL/HDL RATIO: 4
Cholesterol: 192 mg/dL (ref 0–200)
HDL: 54.4 mg/dL (ref >39.00)
LDL Cholesterol: 113 mg/dL — ABNORMAL HIGH (ref 0–99)
NONHDL: 137.22
Triglycerides: 122 mg/dL (ref 0.0–149.0)
VLDL: 24.4 mg/dL (ref 0.0–40.0)

## 2015-05-12 LAB — BASIC METABOLIC PANEL
BUN: 22 mg/dL (ref 6–23)
CHLORIDE: 100 meq/L (ref 96–112)
CO2: 28 meq/L (ref 19–32)
CREATININE: 0.95 mg/dL (ref 0.40–1.20)
Calcium: 9.5 mg/dL (ref 8.4–10.5)
GFR: 65.46 mL/min (ref >60.00)
Glucose, Bld: 90 mg/dL (ref 70–99)
Potassium: 4.2 mEq/L (ref 3.5–5.1)
Sodium: 137 mEq/L (ref 135–145)

## 2015-05-12 LAB — CBC WITH DIFFERENTIAL/PLATELET
BASOS ABS: 0.1 10*3/uL (ref 0.0–0.1)
Basophils Relative: 0.8 % (ref 0.0–3.0)
EOS ABS: 0.1 10*3/uL (ref 0.0–0.7)
Eosinophils Relative: 0.8 % (ref 0.0–5.0)
HCT: 42.7 % (ref 36.0–46.0)
Hemoglobin: 14.6 g/dL (ref 12.0–15.0)
LYMPHS ABS: 3 10*3/uL (ref 0.7–4.0)
Lymphocytes Relative: 32.8 % (ref 12.0–46.0)
MCHC: 34.3 g/dL (ref 30.0–36.0)
MCV: 89.6 fl (ref 78.0–100.0)
Monocytes Absolute: 0.6 10*3/uL (ref 0.1–1.0)
Monocytes Relative: 6.5 % (ref 3.0–12.0)
NEUTROS ABS: 5.5 10*3/uL (ref 1.4–7.7)
NEUTROS PCT: 59.1 % (ref 43.0–77.0)
PLATELETS: 364 10*3/uL (ref 150.0–400.0)
RBC: 4.76 Mil/uL (ref 3.87–5.11)
RDW: 13.1 % (ref 11.5–15.5)
WBC: 9.2 10*3/uL (ref 4.0–10.5)

## 2015-05-12 LAB — HEPATIC FUNCTION PANEL
ALK PHOS: 65 U/L (ref 39–117)
ALT: 20 U/L (ref 0–35)
AST: 21 U/L (ref 0–37)
Albumin: 4.3 g/dL (ref 3.5–5.2)
BILIRUBIN DIRECT: 0.1 mg/dL (ref 0.0–0.3)
BILIRUBIN TOTAL: 0.4 mg/dL (ref 0.2–1.2)
Total Protein: 7.2 g/dL (ref 6.0–8.3)

## 2015-05-12 LAB — TSH: TSH: 0.97 u[IU]/mL (ref 0.35–4.50)

## 2015-05-13 ENCOUNTER — Other Ambulatory Visit: Payer: Self-pay | Admitting: Internal Medicine

## 2015-05-13 ENCOUNTER — Encounter: Payer: Self-pay | Admitting: Internal Medicine

## 2015-05-13 DIAGNOSIS — E7800 Pure hypercholesterolemia, unspecified: Secondary | ICD-10-CM

## 2015-05-13 NOTE — Progress Notes (Signed)
Order placed for f/u liver panel.  

## 2015-05-19 ENCOUNTER — Telehealth: Payer: Self-pay | Admitting: *Deleted

## 2015-05-19 NOTE — Telephone Encounter (Signed)
Patient contacted today and she confirms no medication changes since last office visit. She reports she has yet to pick up Miralax prescription but was instructed to do so.   We will proceed with colonoscopy as scheduled for 05-26-15 at [facility].   This patient was instructed to call the office should she have further questions.

## 2015-05-26 ENCOUNTER — Ambulatory Visit: Payer: BLUE CROSS/BLUE SHIELD | Admitting: Registered Nurse

## 2015-05-26 ENCOUNTER — Ambulatory Visit
Admission: RE | Admit: 2015-05-26 | Discharge: 2015-05-26 | Disposition: A | Discharge status: Home / Self Care | Payer: BLUE CROSS/BLUE SHIELD | Source: Ambulatory Visit | Attending: General Surgery | Admitting: General Surgery

## 2015-05-26 ENCOUNTER — Encounter
Admission: RE | Disposition: A | Discharge status: Home / Self Care | Payer: Self-pay | Source: Ambulatory Visit | Attending: General Surgery

## 2015-05-26 DIAGNOSIS — Z79899 Other long term (current) drug therapy: Secondary | ICD-10-CM | POA: Insufficient documentation

## 2015-05-26 DIAGNOSIS — Z90710 Acquired absence of both cervix and uterus: Secondary | ICD-10-CM | POA: Insufficient documentation

## 2015-05-26 DIAGNOSIS — I10 Essential (primary) hypertension: Secondary | ICD-10-CM | POA: Insufficient documentation

## 2015-05-26 DIAGNOSIS — Z1211 Encounter for screening for malignant neoplasm of colon: Secondary | ICD-10-CM | POA: Insufficient documentation

## 2015-05-26 DIAGNOSIS — E7800 Pure hypercholesterolemia, unspecified: Secondary | ICD-10-CM | POA: Diagnosis not present

## 2015-05-26 HISTORY — DX: Other specified postprocedural states: Z98.890

## 2015-05-26 HISTORY — PX: COLONOSCOPY WITH PROPOFOL: SHX5780

## 2015-05-26 HISTORY — DX: Nausea with vomiting, unspecified: R11.2

## 2015-05-26 SURGERY — COLONOSCOPY WITH PROPOFOL
Anesthesia: General

## 2015-05-26 MED ORDER — PROPOFOL 10 MG/ML IV BOLUS
INTRAVENOUS | Status: DC | PRN
  Administered 2015-05-26: 20 mg via INTRAVENOUS
  Administered 2015-05-26: 50 mg via INTRAVENOUS

## 2015-05-26 MED ORDER — FENTANYL CITRATE (PF) 100 MCG/2ML IJ SOLN
INTRAMUSCULAR | Status: DC | PRN
  Administered 2015-05-26 (×2): 25 ug via INTRAVENOUS
  Administered 2015-05-26: 50 ug via INTRAVENOUS

## 2015-05-26 MED ORDER — SODIUM CHLORIDE 0.9 % IV SOLN
INTRAVENOUS | Status: DC
  Administered 2015-05-26: 1000 mL via INTRAVENOUS

## 2015-05-26 MED ORDER — MIDAZOLAM HCL 2 MG/2ML IJ SOLN
INTRAMUSCULAR | Status: DC | PRN
  Administered 2015-05-26: 1 mg via INTRAVENOUS

## 2015-05-26 MED ORDER — PROPOFOL 500 MG/50ML IV EMUL
INTRAVENOUS | Status: DC | PRN
  Administered 2015-05-26: 175 ug/kg/min via INTRAVENOUS

## 2015-05-26 MED ORDER — PHENYLEPHRINE HCL 10 MG/ML IJ SOLN
INTRAMUSCULAR | Status: DC | PRN
  Administered 2015-05-26: 100 ug via INTRAVENOUS

## 2015-05-26 MED ORDER — LIDOCAINE HCL (CARDIAC) 20 MG/ML IV SOLN
INTRAVENOUS | Status: DC | PRN
  Administered 2015-05-26: 40 mg via INTRAVENOUS

## 2015-05-26 NOTE — Op Note (Signed)
[facility] Patient Name: Rhonda Sanders Procedure Date: 05/26/2015 9:05 AM MRN: [id number] Account #: [id number] Date of Birth: [birth year] Admit Type: Outpatient Age: 55 Room: [facility] ENDO ROOM 4 Gender: Female Note Status: Finalized Procedure:            Colonoscopy Indications:          Screening for colorectal malignant neoplasm Providers:            Robert Bellow, MD Referring MD:         Einar Pheasant, MD (Referring MD) Medicines:            Monitored Anesthesia Care Complications:        No immediate complications. Procedure:            Pre-Anesthesia Assessment:                       - Prior to the procedure, a History and Physical was                        performed, and patient medications, allergies and                        sensitivities were reviewed. The patient's tolerance of                        previous anesthesia was reviewed.                       - The risks and benefits of the procedure and the                        sedation options and risks were discussed with the                        patient. All questions were answered and informed                        consent was obtained.                       After obtaining informed consent, the colonoscope was                        passed under direct vision. Throughout the procedure,                        the patient's blood pressure, pulse, and oxygen                        saturations were monitored continuously. The                        Colonoscope was introduced through the anus and                        advanced to the the cecum, identified by appendiceal                        orifice and ileocecal valve. The colonoscopy was  somewhat difficult due to a tortuous colon. The patient                        tolerated the procedure well. The quality of the bowel                        preparation was excellent. Findings:      The entire examined  colon appeared normal on direct and retroflexion       views. Impression:           - The entire examined colon is normal on direct and                        retroflexion views.                       - No specimens collected. Recommendation:       - Repeat colonoscopy in 10 years for screening purposes. Procedure Code(s):    --- Professional ---                       [phone], Colonoscopy, flexible; diagnostic, including                        collection of specimen(s) by brushing or washing, when                        performed (separate procedure) Diagnosis Code(s):    --- Professional ---                       Z12.11, Encounter for screening for malignant neoplasm                        of colon CPT copyright 2016 American Medical Association. All rights reserved. The codes documented in this report are preliminary and upon coder review may  be revised to meet current compliance requirements. Robert Bellow, MD 05/26/2015 9:36:24 AM This report has been signed electronically. Number of Addenda: 0 Note Initiated On: 05/26/2015 9:05 AM Scope Withdrawal Time: 0 hours 8 minutes 28 seconds  Total Procedure Duration: 0 hours 20 minutes 6 seconds       [facility]

## 2015-05-26 NOTE — Anesthesia Postprocedure Evaluation (Signed)
Anesthesia Post Note  Patient: Rhonda Sanders  Procedure(s) Performed: Procedure(s) (LRB): COLONOSCOPY WITH PROPOFOL (N/A)  Patient location during evaluation: PACU Anesthesia Type: General Level of consciousness: awake and alert Pain management: pain level controlled Vital Signs Assessment: post-procedure vital signs reviewed and stable Respiratory status: spontaneous breathing, nonlabored ventilation, respiratory function stable and patient connected to nasal cannula oxygen Cardiovascular status: blood pressure returned to baseline and stable Postop Assessment: no signs of nausea or vomiting Anesthetic complications: no    Last Vitals:  Filed Vitals:   05/26/15 0950 05/26/15 1000  BP: 125/69 114/73  Pulse: 82 76  Temp:    Resp: 19 17    Last Pain: There were no vitals filed for this visit.               Martha Clan

## 2015-05-26 NOTE — Anesthesia Preprocedure Evaluation (Signed)
Anesthesia Evaluation  Patient identified by MRN, date of birth, ID band Patient awake    Reviewed: Allergy & Precautions, H&P , NPO status , Patient's Chart, lab work & pertinent test results, reviewed documented beta blocker date and time   History of Anesthesia Complications (+) PONV and history of anesthetic complications  Airway Mallampati: III  TM Distance: >3 FB Neck ROM: full    Dental no notable dental hx. (+) Teeth Intact, Caps   Pulmonary neg pulmonary ROS,    Pulmonary exam normal breath sounds clear to auscultation       Cardiovascular Exercise Tolerance: Good hypertension, (-) angina(-) CAD, (-) Past MI, (-) Cardiac Stents and (-) CABG Normal cardiovascular exam(-) dysrhythmias (-) Valvular Problems/Murmurs Rhythm:regular Rate:Normal     Neuro/Psych negative neurological ROS  negative psych ROS   GI/Hepatic negative GI ROS, Neg liver ROS,   Endo/Other  negative endocrine ROS  Renal/GU negative Renal ROS  negative genitourinary   Musculoskeletal   Abdominal   Peds  Hematology negative hematology ROS (+)   Anesthesia Other Findings Past Medical History:   Hypertension                                                 Hypercholesterolemia                                         PONV (postoperative nausea and vomiting)                     Reproductive/Obstetrics negative OB ROS                             Anesthesia Physical Anesthesia Plan  ASA: II  Anesthesia Plan: General   Post-op Pain Management:    Induction:   Airway Management Planned:   Additional Equipment:   Intra-op Plan:   Post-operative Plan:   Informed Consent: I have reviewed the patients History and Physical, chart, labs and discussed the procedure including the risks, benefits and alternatives for the proposed anesthesia with the patient or authorized representative who has indicated his/her  understanding and acceptance.   Dental Advisory Given  Plan Discussed with: Anesthesiologist, CRNA and Surgeon  Anesthesia Plan Comments:         Anesthesia Quick Evaluation

## 2015-05-26 NOTE — Anesthesia Procedure Notes (Signed)
Date/Time: 05/26/2015 9:10 AM Performed by: Doreen Salvage Pre-anesthesia Checklist: Patient identified, Emergency Drugs available, Suction available and Patient being monitored Patient Re-evaluated:Patient Re-evaluated prior to inductionOxygen Delivery Method: Nasal cannula Intubation Type: IV induction Dental Injury: Teeth and Oropharynx as per pre-operative assessment  Comments: Nasal cannula with etCO2 monitoring

## 2015-05-26 NOTE — H&P (Signed)
Rhonda Sanders [id number] 1960-06-15     HPI: Candidate for screening colonoscopy.   Prescriptions prior to admission  Medication Sig Dispense Refill Last Dose  . hydrochlorothiazide (HYDRODIURIL) 25 MG tablet Take 1 tablet (25 mg total) by mouth daily. 90 tablet 3 05/26/2015 at 0600  . lisinopril (PRINIVIL,ZESTRIL) 30 MG tablet Take 1 tablet (30 mg total) by mouth daily. 90 tablet 3 05/26/2015 at 0600  . cetirizine (ZYRTEC) 10 MG tablet Take 10 mg by mouth daily.   Taking  . Magnesium 500 MG TABS Take by mouth daily.   Taking  . montelukast (SINGULAIR) 10 MG tablet Take 1 tablet (10 mg total) by mouth at bedtime. 90 tablet 3 Taking  . Multiple Vitamin (MULTIVITAMIN) tablet Take 1 tablet by mouth daily.   Taking  . polyethylene glycol powder (GLYCOLAX/MIRALAX) powder 255 grams one bottle for colonoscopy prep 255 g 0   . rosuvastatin (CRESTOR) 5 MG tablet TAKE 1 TABLET BY MOUTH DAILY 30 tablet 6    No Known Allergies Past Medical History  Diagnosis Date  . Hypertension   . Hypercholesterolemia   . PONV (postoperative nausea and vomiting)    Past Surgical History  Procedure Laterality Date  . Abdominal hysterectomy  04/2006    partial  . Breast lumpectomy Right 12-2008    biopsy, columnar cell hyperplasia  . Breast biopsy Right 2010    EXCISIONAL - NEG  . Breast biopsy Right 2014    CORE W/CLIP - NEG  . Breast biopsy Right 2015    CORE W/OUT CLIP - NEG   Social History   Social History  . Marital Status: Single    Spouse Name: N/A  . Number of Children: N/A  . Years of Education: N/A   Occupational History  . Not on file.   Social History Main Topics  . Smoking status: Never Smoker   . Smokeless tobacco: Never Used  . Alcohol Use: 0.0 oz/week    0 Standard drinks or equivalent per week     Comment: occasional  . Drug Use: No  . Sexual Activity: Not on file   Other Topics Concern  . Not on file   Social History Narrative   Social History   Social History  Narrative     ROS: Negative.     PE: HEENT: Negative. Lungs: Clear. Cardio: RR. Robert Bellow 05/26/2015   Assessment/Plan:  Proceed with planned endoscopy.

## 2015-05-26 NOTE — Transfer of Care (Signed)
Immediate Anesthesia Transfer of Care Note  Patient: Rhonda Sanders  Procedure(s) Performed: Procedure(s): COLONOSCOPY WITH PROPOFOL (N/A)  Patient Location: PACU and Endoscopy Unit  Anesthesia Type:General  Level of Consciousness: sedated  Airway & Oxygen Therapy: Patient Spontanous Breathing and Patient connected to nasal cannula oxygen  Post-op Assessment: Report given to RN and Post -op Vital signs reviewed and stable  Post vital signs: Reviewed and stable  Last Vitals:  Filed Vitals:   05/26/15 0930 05/26/15 0938  BP:  88/50  Pulse:  79  Temp: 36.1 C 36.1 C  Resp:  18    Complications: No apparent anesthesia complications

## 2015-05-27 ENCOUNTER — Encounter: Payer: Self-pay | Admitting: Internal Medicine

## 2015-05-28 ENCOUNTER — Encounter: Payer: Self-pay | Admitting: Internal Medicine

## 2015-05-28 NOTE — Telephone Encounter (Signed)
Letter mailed to patient.

## 2015-05-28 NOTE — Telephone Encounter (Signed)
Letter printed and signed and placed in your box.  Please mail to pt per her request in my chart message.  Thanks    Dr Nicki Reaper

## 2015-06-03 ENCOUNTER — Encounter: Payer: Self-pay | Admitting: General Surgery

## 2015-06-07 DIAGNOSIS — R635 Abnormal weight gain: Secondary | ICD-10-CM | POA: Diagnosis not present

## 2015-06-07 DIAGNOSIS — I10 Essential (primary) hypertension: Secondary | ICD-10-CM | POA: Diagnosis not present

## 2015-06-07 DIAGNOSIS — J301 Allergic rhinitis due to pollen: Secondary | ICD-10-CM | POA: Diagnosis not present

## 2015-06-17 DIAGNOSIS — H40003 Preglaucoma, unspecified, bilateral: Secondary | ICD-10-CM | POA: Diagnosis not present

## 2015-06-23 DIAGNOSIS — H40003 Preglaucoma, unspecified, bilateral: Secondary | ICD-10-CM | POA: Diagnosis not present

## 2015-07-01 ENCOUNTER — Other Ambulatory Visit: Payer: BLUE CROSS/BLUE SHIELD

## 2015-07-12 DIAGNOSIS — I10 Essential (primary) hypertension: Secondary | ICD-10-CM | POA: Diagnosis not present

## 2015-07-12 DIAGNOSIS — J019 Acute sinusitis, unspecified: Secondary | ICD-10-CM | POA: Diagnosis not present

## 2015-07-12 DIAGNOSIS — R635 Abnormal weight gain: Secondary | ICD-10-CM | POA: Diagnosis not present

## 2015-07-12 DIAGNOSIS — J301 Allergic rhinitis due to pollen: Secondary | ICD-10-CM | POA: Diagnosis not present

## 2015-07-19 ENCOUNTER — Other Ambulatory Visit: Payer: BLUE CROSS/BLUE SHIELD

## 2015-07-23 ENCOUNTER — Other Ambulatory Visit (INDEPENDENT_AMBULATORY_CARE_PROVIDER_SITE_OTHER): Payer: BLUE CROSS/BLUE SHIELD

## 2015-07-23 DIAGNOSIS — E7800 Pure hypercholesterolemia, unspecified: Secondary | ICD-10-CM

## 2015-07-23 LAB — HEPATIC FUNCTION PANEL
ALBUMIN: 4.6 g/dL (ref 3.5–5.2)
ALT: 19 U/L (ref 0–35)
AST: 19 U/L (ref 0–37)
Alkaline Phosphatase: 68 U/L (ref 39–117)
Bilirubin, Direct: 0.1 mg/dL (ref 0.0–0.3)
TOTAL PROTEIN: 7.1 g/dL (ref 6.0–8.3)
Total Bilirubin: 0.8 mg/dL (ref 0.2–1.2)

## 2015-07-26 ENCOUNTER — Encounter: Payer: Self-pay | Admitting: Internal Medicine

## 2015-08-12 DIAGNOSIS — J301 Allergic rhinitis due to pollen: Secondary | ICD-10-CM | POA: Diagnosis not present

## 2015-08-12 DIAGNOSIS — I10 Essential (primary) hypertension: Secondary | ICD-10-CM | POA: Diagnosis not present

## 2015-08-12 DIAGNOSIS — N951 Menopausal and female climacteric states: Secondary | ICD-10-CM | POA: Diagnosis not present

## 2015-08-12 DIAGNOSIS — E782 Mixed hyperlipidemia: Secondary | ICD-10-CM | POA: Diagnosis not present

## 2015-09-15 ENCOUNTER — Encounter: Payer: Self-pay | Admitting: Internal Medicine

## 2015-09-15 ENCOUNTER — Telehealth: Payer: Self-pay | Admitting: *Deleted

## 2015-09-15 ENCOUNTER — Ambulatory Visit (INDEPENDENT_AMBULATORY_CARE_PROVIDER_SITE_OTHER): Payer: BLUE CROSS/BLUE SHIELD | Admitting: Internal Medicine

## 2015-09-15 ENCOUNTER — Other Ambulatory Visit: Payer: Self-pay | Admitting: Internal Medicine

## 2015-09-15 VITALS — BP 110/70 | HR 76 | Temp 98.1°F | Resp 18 | Ht 63.0 in | Wt 153.5 lb

## 2015-09-15 DIAGNOSIS — E7800 Pure hypercholesterolemia, unspecified: Secondary | ICD-10-CM | POA: Diagnosis not present

## 2015-09-15 DIAGNOSIS — Z9189 Other specified personal risk factors, not elsewhere classified: Secondary | ICD-10-CM

## 2015-09-15 DIAGNOSIS — R748 Abnormal levels of other serum enzymes: Secondary | ICD-10-CM

## 2015-09-15 DIAGNOSIS — D75839 Thrombocytosis, unspecified: Secondary | ICD-10-CM

## 2015-09-15 DIAGNOSIS — R5383 Other fatigue: Secondary | ICD-10-CM

## 2015-09-15 DIAGNOSIS — Z87898 Personal history of other specified conditions: Secondary | ICD-10-CM

## 2015-09-15 DIAGNOSIS — I10 Essential (primary) hypertension: Secondary | ICD-10-CM

## 2015-09-15 DIAGNOSIS — D473 Essential (hemorrhagic) thrombocythemia: Secondary | ICD-10-CM

## 2015-09-15 LAB — HEPATIC FUNCTION PANEL
ALBUMIN: 4.8 g/dL (ref 3.5–5.2)
ALT: 25 U/L (ref 0–35)
AST: 23 U/L (ref 0–37)
Alkaline Phosphatase: 74 U/L (ref 39–117)
BILIRUBIN DIRECT: 0.1 mg/dL (ref 0.0–0.3)
BILIRUBIN TOTAL: 0.5 mg/dL (ref 0.2–1.2)
Total Protein: 7.7 g/dL (ref 6.0–8.3)

## 2015-09-15 LAB — LIPID PANEL
CHOL/HDL RATIO: 4
Cholesterol: 204 mg/dL — ABNORMAL HIGH (ref 0–200)
HDL: 50.5 mg/dL (ref >39.00)
LDL CALC: 114 mg/dL — AB (ref 0–99)
NonHDL: 153.59
Triglycerides: 200 mg/dL — ABNORMAL HIGH (ref 0.0–149.0)
VLDL: 40 mg/dL (ref 0.0–40.0)

## 2015-09-15 LAB — CBC WITH DIFFERENTIAL/PLATELET
BASOS PCT: 0.3 % (ref 0.0–3.0)
Basophils Absolute: 0 10*3/uL (ref 0.0–0.1)
EOS ABS: 0.1 10*3/uL (ref 0.0–0.7)
Eosinophils Relative: 0.5 % (ref 0.0–5.0)
HEMATOCRIT: 44.1 % (ref 36.0–46.0)
Hemoglobin: 15.1 g/dL — ABNORMAL HIGH (ref 12.0–15.0)
LYMPHS PCT: 22 % (ref 12.0–46.0)
Lymphs Abs: 2.6 10*3/uL (ref 0.7–4.0)
MCHC: 34.2 g/dL (ref 30.0–36.0)
MCV: 90.2 fl (ref 78.0–100.0)
MONOS PCT: 4.8 % (ref 3.0–12.0)
Monocytes Absolute: 0.6 10*3/uL (ref 0.1–1.0)
NEUTROS ABS: 8.4 10*3/uL — AB (ref 1.4–7.7)
Neutrophils Relative %: 72.4 % (ref 43.0–77.0)
PLATELETS: 444 10*3/uL — AB (ref 150.0–400.0)
RBC: 4.89 Mil/uL (ref 3.87–5.11)
RDW: 13.6 % (ref 11.5–15.5)
WBC: 11.6 10*3/uL — ABNORMAL HIGH (ref 4.0–10.5)

## 2015-09-15 LAB — BASIC METABOLIC PANEL
BUN: 20 mg/dL (ref 6–23)
CALCIUM: 10.5 mg/dL (ref 8.4–10.5)
CHLORIDE: 100 meq/L (ref 96–112)
CO2: 29 mEq/L (ref 19–32)
CREATININE: 0.91 mg/dL (ref 0.40–1.20)
GFR: 68.7 mL/min (ref >60.00)
Glucose, Bld: 104 mg/dL — ABNORMAL HIGH (ref 70–99)
Potassium: 4.4 mEq/L (ref 3.5–5.1)
Sodium: 138 mEq/L (ref 135–145)

## 2015-09-15 LAB — TSH: TSH: 0.84 u[IU]/mL (ref 0.35–4.50)

## 2015-09-15 LAB — VITAMIN B12: VITAMIN B 12: 744 pg/mL (ref 211–911)

## 2015-09-15 NOTE — Telephone Encounter (Signed)
Please advise  a 2 month follow up early in the morning or latest appt.for a 30 min follow up

## 2015-09-15 NOTE — Progress Notes (Signed)
Patient ID: Rhonda Sanders, female   DOB: Jul 29, 1960, 55 y.o.   MRN: [id number]   Subjective:    Patient ID: Rhonda Sanders, female    DOB: Mar 22, 1960, 55 y.o.   MRN: [id number]  HPI  Patient here for a scheduled follow up.  Her main complaint is that of fatigue.  She is under increased stress at work.  Feels overwhelmed.  Not exercising regularly now.  Increased family stress with her step father's medical issues.  She feels she is handling things relatively well.  Just tired.  No depression.  Eating regular meals.  No nausea or vomiting.  Bowels stable.  No abdominal pain or cramping.  No chest pain or sob.    Past Medical History  Diagnosis Date  . Hypertension   . Hypercholesterolemia   . PONV (postoperative nausea and vomiting)   . Atypical lobular hyperplasia of right breast 12/25/2008    Sngle focus noted in area of columnar cell hyperplasia   Past Surgical History  Procedure Laterality Date  . Abdominal hysterectomy  04/2006    partial  . Breast lumpectomy Right 12-2008    biopsy, columnar cell hyperplasia  . Breast biopsy Right 2010    EXCISIONAL - NEG  . Breast biopsy Right 2014    CORE W/CLIP - NEG  . Breast biopsy Right 2015    CORE W/OUT CLIP - NEG  . Colonoscopy with propofol N/A 05/26/2015    Procedure: COLONOSCOPY WITH PROPOFOL;  Surgeon: Robert Bellow, MD;  Location: [facility] ENDOSCOPY;  Service: Endoscopy;  Laterality: N/A;   Family History  Problem Relation Age of Onset  . Heart disease Father     first MI (2s), died age 79 - MI  . Breast cancer Mother 59  . Heart disease Paternal Grandfather   . Heart disease Paternal Uncle   . Breast cancer Paternal Grandmother   . Breast cancer Maternal Aunt   . CVA Paternal Grandmother   . Crohn's disease Brother   . Colon cancer Neg Hx    Social History   Social History  . Marital Status: Single    Spouse Name: N/A  . Number of Children: N/A  . Years of Education: N/A   Social History Main Topics  . Smoking  status: Never Smoker   . Smokeless tobacco: Never Used  . Alcohol Use: 0.0 oz/week    0 Standard drinks or equivalent per week     Comment: occasional  . Drug Use: No  . Sexual Activity: Not Asked   Other Topics Concern  . None   Social History Narrative    Outpatient Encounter Prescriptions as of 09/15/2015  Medication Sig  . cetirizine (ZYRTEC) 10 MG tablet Take 10 mg by mouth daily.  . hydrochlorothiazide (HYDRODIURIL) 25 MG tablet Take 1 tablet (25 mg total) by mouth daily.  Marland Kitchen lisinopril (PRINIVIL,ZESTRIL) 30 MG tablet Take 1 tablet (30 mg total) by mouth daily.  . Magnesium 500 MG TABS Take by mouth daily.  . montelukast (SINGULAIR) 10 MG tablet Take 1 tablet (10 mg total) by mouth at bedtime.  . Multiple Vitamin (MULTIVITAMIN) tablet Take 1 tablet by mouth daily.  . rosuvastatin (CRESTOR) 5 MG tablet TAKE 1 TABLET BY MOUTH DAILY   No facility-administered encounter medications on file as of 09/15/2015.    Review of Systems  Constitutional: Positive for fatigue. Negative for appetite change and unexpected weight change.  HENT: Negative for congestion and sinus pressure.   Respiratory: Negative for cough, chest  tightness and shortness of breath.   Cardiovascular: Negative for chest pain, palpitations and leg swelling.  Gastrointestinal: Negative for nausea, vomiting, abdominal pain and diarrhea.  Genitourinary: Negative for dysuria and difficulty urinating.  Musculoskeletal: Negative for back pain and joint swelling.  Skin: Negative for color change and rash.  Neurological: Negative for dizziness, light-headedness and headaches.  Psychiatric/Behavioral: Negative for dysphoric mood and agitation.       Objective:    Physical Exam  Constitutional: She appears well-developed and well-nourished. No distress.  HENT:  Nose: Nose normal.  Mouth/Throat: Oropharynx is clear and moist.  Neck: Neck supple. No thyromegaly present.  Cardiovascular: Normal rate and regular  rhythm.   Pulmonary/Chest: Breath sounds normal. No respiratory distress. She has no wheezes.  Abdominal: Soft. Bowel sounds are normal. There is no tenderness.  Musculoskeletal: She exhibits no edema or tenderness.  Lymphadenopathy:    She has no cervical adenopathy.  Skin: No rash noted. No erythema.  Psychiatric: She has a normal mood and affect. Her behavior is normal.    BP 110/70 mmHg  Pulse 76  Temp(Src) 98.1 F (36.7 C) (Oral)  Resp 18  Ht 5\' 3"  (1.6 m)  Wt 153 lb 8 oz (69.627 kg)  BMI 27.20 kg/m2  SpO2 98% Wt Readings from Last 3 Encounters:  09/15/15 153 lb 8 oz (69.627 kg)  05/26/15 147 lb (66.679 kg)  04/12/15 154 lb (69.854 kg)     Lab Results  Component Value Date   WBC 9.2 05/12/2015   HGB 14.6 05/12/2015   HCT 42.7 05/12/2015   PLT 364.0 05/12/2015   GLUCOSE 90 05/12/2015   CHOL 192 05/12/2015   TRIG 122.0 05/12/2015   HDL 54.40 05/12/2015   LDLDIRECT 176.0 09/24/2014   LDLCALC 113* 05/12/2015   ALT 19 07/23/2015   AST 19 07/23/2015   NA 137 05/12/2015   K 4.2 05/12/2015   CL 100 05/12/2015   CREATININE 0.95 05/12/2015   BUN 22 05/12/2015   CO2 28 05/12/2015   TSH 0.97 05/12/2015       Assessment & Plan:   Problem List Items Addressed This Visit    Abnormal liver enzymes    Last liver panel wnl.  Recheck today.       Fatigue - Primary    Probably multifactorial.  Increased stress at work and with her family.  Not exercising regularly.  Check cbc and metabolic panel as well as tsh.  Follow.       Relevant Orders   CBC with Differential/Platelet   TSH   Vitamin 123456   Basic metabolic panel   History of abnormal mammogram    Mammogram 03/25/15 - birads I.       Hypercholesterolemia    On crestor.  Low cholesterol diet and exercise.  Follow lipid panel and liver function tests.  Check today.       Relevant Orders   Lipid panel   Hepatic function panel   Hypertension    Blood pressure under good control.  Continue same medication  regimen.  Follow pressures.  Follow metabolic panel.            Einar Pheasant, MD

## 2015-09-15 NOTE — Assessment & Plan Note (Signed)
Blood pressure under good control.  Continue same medication regimen.  Follow pressures.  Follow metabolic panel.   

## 2015-09-15 NOTE — Assessment & Plan Note (Signed)
On crestor.  Low cholesterol diet and exercise.  Follow lipid panel and liver function tests.  Check today.

## 2015-09-15 NOTE — Progress Notes (Signed)
Pre-visit discussion using our clinic review tool. No additional management support is needed unless otherwise documented below in the visit note.  

## 2015-09-15 NOTE — Assessment & Plan Note (Signed)
Probably multifactorial.  Increased stress at work and with her family.  Not exercising regularly.  Check cbc and metabolic panel as well as tsh.  Follow.

## 2015-09-15 NOTE — Assessment & Plan Note (Signed)
Last liver panel wnl.  Recheck today.

## 2015-09-15 NOTE — Assessment & Plan Note (Signed)
Mammogram 03/25/15 - birads I.

## 2015-09-15 NOTE — Progress Notes (Signed)
Order placed for f/u cbc.   

## 2015-09-17 ENCOUNTER — Encounter: Payer: Self-pay | Admitting: *Deleted

## 2015-09-17 DIAGNOSIS — E782 Mixed hyperlipidemia: Secondary | ICD-10-CM | POA: Diagnosis not present

## 2015-09-17 DIAGNOSIS — E280 Estrogen excess: Secondary | ICD-10-CM | POA: Diagnosis not present

## 2015-09-17 DIAGNOSIS — R5383 Other fatigue: Secondary | ICD-10-CM | POA: Diagnosis not present

## 2015-09-17 DIAGNOSIS — I10 Essential (primary) hypertension: Secondary | ICD-10-CM | POA: Diagnosis not present

## 2015-09-28 DIAGNOSIS — N951 Menopausal and female climacteric states: Secondary | ICD-10-CM | POA: Diagnosis not present

## 2015-09-28 DIAGNOSIS — E280 Estrogen excess: Secondary | ICD-10-CM | POA: Diagnosis not present

## 2015-10-01 ENCOUNTER — Other Ambulatory Visit: Payer: BLUE CROSS/BLUE SHIELD

## 2015-10-06 ENCOUNTER — Other Ambulatory Visit (INDEPENDENT_AMBULATORY_CARE_PROVIDER_SITE_OTHER): Payer: BLUE CROSS/BLUE SHIELD

## 2015-10-06 DIAGNOSIS — D75839 Thrombocytosis, unspecified: Secondary | ICD-10-CM

## 2015-10-06 DIAGNOSIS — D473 Essential (hemorrhagic) thrombocythemia: Secondary | ICD-10-CM

## 2015-10-06 LAB — CBC WITH DIFFERENTIAL/PLATELET
BASOS ABS: 0.1 10*3/uL (ref 0.0–0.1)
Basophils Relative: 1.2 % (ref 0.0–3.0)
Eosinophils Absolute: 0.1 10*3/uL (ref 0.0–0.7)
Eosinophils Relative: 0.7 % (ref 0.0–5.0)
HEMATOCRIT: 39.6 % (ref 36.0–46.0)
Hemoglobin: 13.6 g/dL (ref 12.0–15.0)
LYMPHS ABS: 3.1 10*3/uL (ref 0.7–4.0)
LYMPHS PCT: 27.7 % (ref 12.0–46.0)
MCHC: 34.3 g/dL (ref 30.0–36.0)
MCV: 91.1 fl (ref 78.0–100.0)
Monocytes Absolute: 0.8 10*3/uL (ref 0.1–1.0)
Monocytes Relative: 6.9 % (ref 3.0–12.0)
NEUTROS ABS: 7 10*3/uL (ref 1.4–7.7)
Neutrophils Relative %: 63.5 % (ref 43.0–77.0)
PLATELETS: 381 10*3/uL (ref 150.0–400.0)
RBC: 4.35 Mil/uL (ref 3.87–5.11)
RDW: 13.2 % (ref 11.5–15.5)
WBC: 11.1 10*3/uL — ABNORMAL HIGH (ref 4.0–10.5)

## 2015-10-07 ENCOUNTER — Other Ambulatory Visit: Payer: Self-pay | Admitting: Internal Medicine

## 2015-10-07 DIAGNOSIS — D72829 Elevated white blood cell count, unspecified: Secondary | ICD-10-CM

## 2015-10-19 DIAGNOSIS — R5383 Other fatigue: Secondary | ICD-10-CM | POA: Diagnosis not present

## 2015-10-19 DIAGNOSIS — S1096XS Insect bite of unspecified part of neck, sequela: Secondary | ICD-10-CM | POA: Diagnosis not present

## 2015-10-19 DIAGNOSIS — N951 Menopausal and female climacteric states: Secondary | ICD-10-CM | POA: Diagnosis not present

## 2015-10-19 DIAGNOSIS — E280 Estrogen excess: Secondary | ICD-10-CM | POA: Diagnosis not present

## 2015-11-11 ENCOUNTER — Other Ambulatory Visit (INDEPENDENT_AMBULATORY_CARE_PROVIDER_SITE_OTHER): Payer: BLUE CROSS/BLUE SHIELD

## 2015-11-11 DIAGNOSIS — D72829 Elevated white blood cell count, unspecified: Secondary | ICD-10-CM | POA: Diagnosis not present

## 2015-11-11 LAB — CBC WITH DIFFERENTIAL/PLATELET
BASOS ABS: 0 10*3/uL (ref 0.0–0.1)
Basophils Relative: 0.4 % (ref 0.0–3.0)
EOS PCT: 0.5 % (ref 0.0–5.0)
Eosinophils Absolute: 0.1 10*3/uL (ref 0.0–0.7)
HCT: 42.9 % (ref 36.0–46.0)
HEMOGLOBIN: 14.7 g/dL (ref 12.0–15.0)
LYMPHS ABS: 3.2 10*3/uL (ref 0.7–4.0)
Lymphocytes Relative: 29.2 % (ref 12.0–46.0)
MCHC: 34.4 g/dL (ref 30.0–36.0)
MCV: 90.5 fl (ref 78.0–100.0)
MONO ABS: 0.8 10*3/uL (ref 0.1–1.0)
Monocytes Relative: 6.8 % (ref 3.0–12.0)
NEUTROS PCT: 63.1 % (ref 43.0–77.0)
Neutro Abs: 7 10*3/uL (ref 1.4–7.7)
Platelets: 404 10*3/uL — ABNORMAL HIGH (ref 150.0–400.0)
RBC: 4.73 Mil/uL (ref 3.87–5.11)
RDW: 12.8 % (ref 11.5–15.5)
WBC: 11.1 10*3/uL — AB (ref 4.0–10.5)

## 2015-11-12 ENCOUNTER — Encounter: Payer: Self-pay | Admitting: Internal Medicine

## 2015-11-16 ENCOUNTER — Ambulatory Visit: Payer: BLUE CROSS/BLUE SHIELD | Admitting: Internal Medicine

## 2015-11-16 DIAGNOSIS — Z0289 Encounter for other administrative examinations: Secondary | ICD-10-CM

## 2015-11-26 DIAGNOSIS — S1096XS Insect bite of unspecified part of neck, sequela: Secondary | ICD-10-CM | POA: Diagnosis not present

## 2015-11-26 DIAGNOSIS — J301 Allergic rhinitis due to pollen: Secondary | ICD-10-CM | POA: Diagnosis not present

## 2015-11-26 DIAGNOSIS — I10 Essential (primary) hypertension: Secondary | ICD-10-CM | POA: Diagnosis not present

## 2015-11-26 DIAGNOSIS — Z6827 Body mass index (BMI) 27.0-27.9, adult: Secondary | ICD-10-CM | POA: Diagnosis not present

## 2015-12-08 DIAGNOSIS — L821 Other seborrheic keratosis: Secondary | ICD-10-CM | POA: Diagnosis not present

## 2015-12-08 DIAGNOSIS — D229 Melanocytic nevi, unspecified: Secondary | ICD-10-CM | POA: Diagnosis not present

## 2015-12-08 DIAGNOSIS — Z85828 Personal history of other malignant neoplasm of skin: Secondary | ICD-10-CM | POA: Diagnosis not present

## 2015-12-08 DIAGNOSIS — D485 Neoplasm of uncertain behavior of skin: Secondary | ICD-10-CM | POA: Diagnosis not present

## 2015-12-08 DIAGNOSIS — C44712 Basal cell carcinoma of skin of right lower limb, including hip: Secondary | ICD-10-CM | POA: Diagnosis not present

## 2015-12-08 DIAGNOSIS — L718 Other rosacea: Secondary | ICD-10-CM | POA: Diagnosis not present

## 2015-12-08 DIAGNOSIS — L820 Inflamed seborrheic keratosis: Secondary | ICD-10-CM | POA: Diagnosis not present

## 2015-12-16 ENCOUNTER — Other Ambulatory Visit: Payer: Self-pay | Admitting: Internal Medicine

## 2016-01-04 DIAGNOSIS — J301 Allergic rhinitis due to pollen: Secondary | ICD-10-CM | POA: Diagnosis not present

## 2016-01-04 DIAGNOSIS — J019 Acute sinusitis, unspecified: Secondary | ICD-10-CM | POA: Diagnosis not present

## 2016-01-04 DIAGNOSIS — I10 Essential (primary) hypertension: Secondary | ICD-10-CM | POA: Diagnosis not present

## 2016-01-04 DIAGNOSIS — R05 Cough: Secondary | ICD-10-CM | POA: Diagnosis not present

## 2016-01-18 DIAGNOSIS — C44712 Basal cell carcinoma of skin of right lower limb, including hip: Secondary | ICD-10-CM | POA: Diagnosis not present

## 2016-01-18 DIAGNOSIS — D485 Neoplasm of uncertain behavior of skin: Secondary | ICD-10-CM | POA: Diagnosis not present

## 2016-01-18 DIAGNOSIS — L718 Other rosacea: Secondary | ICD-10-CM | POA: Diagnosis not present

## 2016-02-04 DIAGNOSIS — J301 Allergic rhinitis due to pollen: Secondary | ICD-10-CM | POA: Diagnosis not present

## 2016-02-04 DIAGNOSIS — I10 Essential (primary) hypertension: Secondary | ICD-10-CM | POA: Diagnosis not present

## 2016-02-04 DIAGNOSIS — E663 Overweight: Secondary | ICD-10-CM | POA: Diagnosis not present

## 2016-02-21 ENCOUNTER — Encounter: Payer: Self-pay | Admitting: Internal Medicine

## 2016-02-21 ENCOUNTER — Ambulatory Visit (INDEPENDENT_AMBULATORY_CARE_PROVIDER_SITE_OTHER): Payer: BLUE CROSS/BLUE SHIELD | Admitting: Internal Medicine

## 2016-02-21 VITALS — BP 114/70 | HR 89 | Temp 98.3°F | Ht 63.0 in | Wt 155.0 lb

## 2016-02-21 DIAGNOSIS — I10 Essential (primary) hypertension: Secondary | ICD-10-CM

## 2016-02-21 DIAGNOSIS — Z1239 Encounter for other screening for malignant neoplasm of breast: Secondary | ICD-10-CM

## 2016-02-21 DIAGNOSIS — E7800 Pure hypercholesterolemia, unspecified: Secondary | ICD-10-CM | POA: Diagnosis not present

## 2016-02-21 DIAGNOSIS — Z1231 Encounter for screening mammogram for malignant neoplasm of breast: Secondary | ICD-10-CM

## 2016-02-21 DIAGNOSIS — Z87898 Personal history of other specified conditions: Secondary | ICD-10-CM

## 2016-02-21 DIAGNOSIS — R748 Abnormal levels of other serum enzymes: Secondary | ICD-10-CM

## 2016-02-21 NOTE — Progress Notes (Signed)
Pre visit review using our clinic review tool, if applicable. No additional management support is needed unless otherwise documented below in the visit note. 

## 2016-02-21 NOTE — Progress Notes (Signed)
Patient ID: Rhonda Sanders, female   DOB: [birth year], 55 y.o.   MRN: [id number]   Subjective:    Patient ID: Rhonda Sanders, female    DOB: 1960/10/27, 55 y.o.   MRN: [id number]  HPI  Patient here for a scheduled follow up. She has been under increased stress recently  Working a lot of hours.  Discussed with her today.  Overall she feels she is handling things relatively well.  Tries to stay active.  No chest pain.  No sob.  No acid reflux.  No abdominal pain or cramping.  Bowels stable.  Has been having some aching in her hips.  She relates it to taking the crestor daily.  Wants to decrease the dose and see if symptoms improve.  Discussed stopping temporarily.     Past Medical History:  Diagnosis Date  . Atypical lobular hyperplasia of right breast 12/25/2008   Sngle focus noted in area of columnar cell hyperplasia  . Hypercholesterolemia   . Hypertension   . PONV (postoperative nausea and vomiting)    Past Surgical History:  Procedure Laterality Date  . ABDOMINAL HYSTERECTOMY  04/2006   partial  . BREAST BIOPSY Right 2010   EXCISIONAL - NEG  . BREAST BIOPSY Right 2014   CORE W/CLIP - NEG  . BREAST BIOPSY Right 2015   CORE W/OUT CLIP - NEG  . BREAST LUMPECTOMY Right 12-2008   biopsy, columnar cell hyperplasia  . COLONOSCOPY WITH PROPOFOL N/A 05/26/2015   Procedure: COLONOSCOPY WITH PROPOFOL;  Surgeon: Robert Bellow, MD;  Location: [facility] ENDOSCOPY;  Service: Endoscopy;  Laterality: N/A;   Family History  Problem Relation Age of Onset  . Breast cancer Mother 6  . Heart disease Father     first MI (70s), died age 74 - MI  . Heart disease Paternal Grandfather   . Heart disease Paternal Uncle   . Breast cancer Paternal Grandmother   . CVA Paternal Grandmother   . Breast cancer Maternal Aunt   . Crohn's disease Brother   . Colon cancer Neg Hx    Social History   Social History  . Marital status: Single    Spouse name: N/A  . Number of children: N/A  . Years of education:  N/A   Social History Main Topics  . Smoking status: Never Smoker  . Smokeless tobacco: Never Used  . Alcohol use 0.0 oz/week     Comment: occasional  . Drug use: No  . Sexual activity: Not Asked   Other Topics Concern  . None   Social History Narrative  . None    Outpatient Encounter Prescriptions as of 02/21/2016  Medication Sig  . cetirizine (ZYRTEC) 10 MG tablet Take 10 mg by mouth daily.  . hydrochlorothiazide (HYDRODIURIL) 25 MG tablet Take 1 tablet (25 mg total) by mouth daily.  Marland Kitchen lisinopril (PRINIVIL,ZESTRIL) 30 MG tablet Take 1 tablet (30 mg total) by mouth daily.  . Magnesium 500 MG TABS Take by mouth daily.  . montelukast (SINGULAIR) 10 MG tablet Take 1 tablet (10 mg total) by mouth at bedtime.  . Multiple Vitamin (MULTIVITAMIN) tablet Take 1 tablet by mouth daily.  . rosuvastatin (CRESTOR) 5 MG tablet TAKE 1 TABLET BY MOUTH DAILY   No facility-administered encounter medications on file as of 02/21/2016.     Review of Systems  Constitutional: Negative for appetite change and unexpected weight change.  HENT: Negative for congestion and sinus pressure.   Respiratory: Negative for cough, chest tightness and shortness  of breath.   Cardiovascular: Negative for chest pain, palpitations and leg swelling.  Gastrointestinal: Negative for abdominal pain, diarrhea, nausea and vomiting.  Genitourinary: Negative for difficulty urinating and dysuria.  Musculoskeletal: Negative for joint swelling.       Hip aching as outlined.    Skin: Negative for color change and rash.  Neurological: Negative for dizziness, light-headedness and headaches.  Psychiatric/Behavioral: Negative for agitation and dysphoric mood.       Objective:    Physical Exam  Constitutional: She appears well-developed and well-nourished. No distress.  HENT:  Nose: Nose normal.  Mouth/Throat: Oropharynx is clear and moist.  Neck: Neck supple. No thyromegaly present.  Cardiovascular: Normal rate and  regular rhythm.   Pulmonary/Chest: Breath sounds normal. No respiratory distress. She has no wheezes.  Abdominal: Soft. Bowel sounds are normal. There is no tenderness.  Musculoskeletal: She exhibits no edema or tenderness.  Lymphadenopathy:    She has no cervical adenopathy.  Skin: No rash noted. No erythema.  Psychiatric: She has a normal mood and affect. Her behavior is normal.    BP 114/70   Pulse 89   Temp 98.3 F (36.8 C) (Oral)   Ht 5\' 3"  (1.6 m)   Wt 155 lb (70.3 kg)   SpO2 96%   BMI 27.46 kg/m  Wt Readings from Last 3 Encounters:  02/21/16 155 lb (70.3 kg)  09/15/15 153 lb 8 oz (69.6 kg)  05/26/15 147 lb (66.7 kg)     Lab Results  Component Value Date   WBC 11.1 (H) 11/11/2015   HGB 14.7 11/11/2015   HCT 42.9 11/11/2015   PLT 404.0 (H) 11/11/2015   GLUCOSE 104 (H) 09/15/2015   CHOL 204 (H) 09/15/2015   TRIG 200.0 (H) 09/15/2015   HDL 50.50 09/15/2015   LDLDIRECT 176.0 09/24/2014   LDLCALC 114 (H) 09/15/2015   ALT 25 09/15/2015   AST 23 09/15/2015   NA 138 09/15/2015   K 4.4 09/15/2015   CL 100 09/15/2015   CREATININE 0.91 09/15/2015   BUN 20 09/15/2015   CO2 29 09/15/2015   TSH 0.84 09/15/2015       Assessment & Plan:   Problem List Items Addressed This Visit    Abnormal liver enzymes    Recheck liver panel.       History of abnormal mammogram    Mammogram 03/25/15 - birads I.  Schedule f/u mammogram.       Hypercholesterolemia    On crestor.  Hip aching as outlined.  Will decrease to three days per week.  Follow closely.  Call with update.        Hypertension    Blood pressure under good control.  Continue same medication regimen.  Follow pressures.  Follow metabolic panel.         Other Visit Diagnoses    Breast cancer screening    -  Primary   Relevant Orders   MM SCREENING BREAST TOMO BILATERAL       Einar Pheasant, MD

## 2016-02-23 ENCOUNTER — Telehealth: Payer: Self-pay

## 2016-02-23 DIAGNOSIS — E7800 Pure hypercholesterolemia, unspecified: Secondary | ICD-10-CM

## 2016-02-23 DIAGNOSIS — I10 Essential (primary) hypertension: Secondary | ICD-10-CM

## 2016-02-23 DIAGNOSIS — D72829 Elevated white blood cell count, unspecified: Secondary | ICD-10-CM

## 2016-02-23 NOTE — Telephone Encounter (Signed)
Pt coming for fasting labs 02/24/16. Please place future orders. Thank you.

## 2016-02-23 NOTE — Telephone Encounter (Signed)
Orders placed for labs

## 2016-02-24 ENCOUNTER — Other Ambulatory Visit: Payer: BLUE CROSS/BLUE SHIELD

## 2016-03-01 ENCOUNTER — Encounter: Payer: Self-pay | Admitting: Internal Medicine

## 2016-03-01 NOTE — Assessment & Plan Note (Signed)
Blood pressure under good control.  Continue same medication regimen.  Follow pressures.  Follow metabolic panel.   

## 2016-03-01 NOTE — Assessment & Plan Note (Signed)
Mammogram 03/25/15 - birads I.  Schedule f/u mammogram.

## 2016-03-01 NOTE — Assessment & Plan Note (Signed)
On crestor.  Hip aching as outlined.  Will decrease to three days per week.  Follow closely.  Call with update.

## 2016-03-01 NOTE — Assessment & Plan Note (Signed)
Recheck liver panel.  

## 2016-03-07 ENCOUNTER — Encounter: Payer: Self-pay | Admitting: Internal Medicine

## 2016-03-08 NOTE — Telephone Encounter (Signed)
Please schedule her at 8:30 on 03/16/16.   Thanks

## 2016-03-17 ENCOUNTER — Other Ambulatory Visit (INDEPENDENT_AMBULATORY_CARE_PROVIDER_SITE_OTHER): Payer: BLUE CROSS/BLUE SHIELD

## 2016-03-17 DIAGNOSIS — I10 Essential (primary) hypertension: Secondary | ICD-10-CM

## 2016-03-17 DIAGNOSIS — E7800 Pure hypercholesterolemia, unspecified: Secondary | ICD-10-CM | POA: Diagnosis not present

## 2016-03-17 DIAGNOSIS — D72829 Elevated white blood cell count, unspecified: Secondary | ICD-10-CM | POA: Diagnosis not present

## 2016-03-17 LAB — BASIC METABOLIC PANEL
BUN: 17 mg/dL (ref 6–23)
CHLORIDE: 103 meq/L (ref 96–112)
CO2: 30 meq/L (ref 19–32)
Calcium: 9.6 mg/dL (ref 8.4–10.5)
Creatinine, Ser: 0.93 mg/dL (ref 0.40–1.20)
GFR: 66.87 mL/min (ref >60.00)
Glucose, Bld: 91 mg/dL (ref 70–99)
Potassium: 4.2 mEq/L (ref 3.5–5.1)
SODIUM: 139 meq/L (ref 135–145)

## 2016-03-17 LAB — CBC WITH DIFFERENTIAL/PLATELET
BASOS PCT: 0.5 % (ref 0.0–3.0)
Basophils Absolute: 0 10*3/uL (ref 0.0–0.1)
EOS ABS: 0.1 10*3/uL (ref 0.0–0.7)
EOS PCT: 0.7 % (ref 0.0–5.0)
HCT: 42.3 % (ref 36.0–46.0)
HEMOGLOBIN: 14.4 g/dL (ref 12.0–15.0)
LYMPHS ABS: 2.8 10*3/uL (ref 0.7–4.0)
Lymphocytes Relative: 31.8 % (ref 12.0–46.0)
MCHC: 34 g/dL (ref 30.0–36.0)
MCV: 89.9 fl (ref 78.0–100.0)
MONO ABS: 0.6 10*3/uL (ref 0.1–1.0)
Monocytes Relative: 7.1 % (ref 3.0–12.0)
NEUTROS ABS: 5.3 10*3/uL (ref 1.4–7.7)
Neutrophils Relative %: 59.9 % (ref 43.0–77.0)
Platelets: 441 10*3/uL — ABNORMAL HIGH (ref 150.0–400.0)
RBC: 4.7 Mil/uL (ref 3.87–5.11)
RDW: 14 % (ref 11.5–15.5)
WBC: 8.9 10*3/uL (ref 4.0–10.5)

## 2016-03-17 LAB — LIPID PANEL
Cholesterol: 190 mg/dL (ref 0–200)
HDL: 52.6 mg/dL (ref >39.00)
LDL Cholesterol: 108 mg/dL — ABNORMAL HIGH (ref 0–99)
NONHDL: 137.45
Total CHOL/HDL Ratio: 4
Triglycerides: 145 mg/dL (ref 0.0–149.0)
VLDL: 29 mg/dL (ref 0.0–40.0)

## 2016-03-17 LAB — HEPATIC FUNCTION PANEL
ALT: 34 U/L (ref 0–35)
AST: 35 U/L (ref 0–37)
Albumin: 4.3 g/dL (ref 3.5–5.2)
Alkaline Phosphatase: 66 U/L (ref 39–117)
BILIRUBIN DIRECT: 0.1 mg/dL (ref 0.0–0.3)
BILIRUBIN TOTAL: 0.6 mg/dL (ref 0.2–1.2)
Total Protein: 7.2 g/dL (ref 6.0–8.3)

## 2016-03-20 ENCOUNTER — Ambulatory Visit (INDEPENDENT_AMBULATORY_CARE_PROVIDER_SITE_OTHER): Payer: BLUE CROSS/BLUE SHIELD | Admitting: Internal Medicine

## 2016-03-20 ENCOUNTER — Encounter: Payer: Self-pay | Admitting: Internal Medicine

## 2016-03-20 VITALS — BP 114/84 | HR 90 | Temp 98.6°F | Ht 63.0 in | Wt 158.1 lb

## 2016-03-20 DIAGNOSIS — Z87898 Personal history of other specified conditions: Secondary | ICD-10-CM

## 2016-03-20 DIAGNOSIS — R748 Abnormal levels of other serum enzymes: Secondary | ICD-10-CM

## 2016-03-20 DIAGNOSIS — E7800 Pure hypercholesterolemia, unspecified: Secondary | ICD-10-CM | POA: Diagnosis not present

## 2016-03-20 DIAGNOSIS — Z0000 Encounter for general adult medical examination without abnormal findings: Secondary | ICD-10-CM | POA: Diagnosis not present

## 2016-03-20 DIAGNOSIS — D696 Thrombocytopenia, unspecified: Secondary | ICD-10-CM

## 2016-03-20 DIAGNOSIS — I10 Essential (primary) hypertension: Secondary | ICD-10-CM

## 2016-03-20 DIAGNOSIS — D75839 Thrombocytosis, unspecified: Secondary | ICD-10-CM

## 2016-03-20 DIAGNOSIS — Z9109 Other allergy status, other than to drugs and biological substances: Secondary | ICD-10-CM

## 2016-03-20 DIAGNOSIS — D473 Essential (hemorrhagic) thrombocythemia: Secondary | ICD-10-CM

## 2016-03-20 NOTE — Progress Notes (Signed)
Patient ID: Rhonda Sanders, female   DOB: 01-01-1961, 56 y.o.   MRN: [id number]   Subjective:    Patient ID: Rhonda Sanders, female    DOB: 04/18/1960, 56 y.o.   MRN: [id number]  HPI  Patient here for a physical exam.  She reports she is doing well.  Feels good.  Losing her job at this end of the month.  Her stress has decreased since finding out about this.  She will look for a new job.  Handling this change well.  Trying to stay active.  No chest pain.  No sob.  No acid reflux.  No abdominal pain or cramping.  Bowels stable.  Blood pressure doing well.     Past Medical History:  Diagnosis Date  . Atypical lobular hyperplasia of right breast 12/25/2008   Sngle focus noted in area of columnar cell hyperplasia  . Hypercholesterolemia   . Hypertension   . PONV (postoperative nausea and vomiting)    Past Surgical History:  Procedure Laterality Date  . ABDOMINAL HYSTERECTOMY  04/2006   partial  . BREAST BIOPSY Right 2010   EXCISIONAL - NEG  . BREAST BIOPSY Right 2014   CORE W/CLIP - NEG  . BREAST BIOPSY Right 2015   CORE W/OUT CLIP - NEG  . BREAST LUMPECTOMY Right 12-2008   biopsy, columnar cell hyperplasia  . COLONOSCOPY WITH PROPOFOL N/A 05/26/2015   Procedure: COLONOSCOPY WITH PROPOFOL;  Surgeon: Robert Bellow, MD;  Location: [facility] ENDOSCOPY;  Service: Endoscopy;  Laterality: N/A;   Family History  Problem Relation Age of Onset  . Breast cancer Mother 39  . Heart disease Father     first MI (55s), died age 28 - MI  . Heart disease Paternal Grandfather   . Heart disease Paternal Uncle   . Breast cancer Paternal Grandmother   . CVA Paternal Grandmother   . Breast cancer Maternal Aunt   . Crohn's disease Brother   . Colon cancer Neg Hx    Social History   Social History  . Marital status: Single    Spouse name: N/A  . Number of children: N/A  . Years of education: N/A   Social History Main Topics  . Smoking status: Never Smoker  . Smokeless tobacco: Never Used  .  Alcohol use 0.0 oz/week     Comment: occasional  . Drug use: No  . Sexual activity: Not Asked   Other Topics Concern  . None   Social History Narrative  . None    Outpatient Encounter Prescriptions as of 03/20/2016  Medication Sig  . cetirizine (ZYRTEC) 10 MG tablet Take 10 mg by mouth daily.  . hydrochlorothiazide (HYDRODIURIL) 25 MG tablet Take 1 tablet (25 mg total) by mouth daily.  Marland Kitchen lisinopril (PRINIVIL,ZESTRIL) 30 MG tablet Take 1 tablet (30 mg total) by mouth daily.  . Magnesium 500 MG TABS Take by mouth daily.  . montelukast (SINGULAIR) 10 MG tablet Take 1 tablet (10 mg total) by mouth at bedtime.  . Multiple Vitamin (MULTIVITAMIN) tablet Take 1 tablet by mouth daily.  . rosuvastatin (CRESTOR) 5 MG tablet TAKE 1 TABLET BY MOUTH DAILY  . [DISCONTINUED] hydrochlorothiazide (HYDRODIURIL) 25 MG tablet Take 1 tablet (25 mg total) by mouth daily.  . [DISCONTINUED] lisinopril (PRINIVIL,ZESTRIL) 30 MG tablet Take 1 tablet (30 mg total) by mouth daily.   No facility-administered encounter medications on file as of 03/20/2016.     Review of Systems  Constitutional: Negative for appetite change and unexpected  weight change.  HENT: Negative for congestion and sinus pressure.   Eyes: Negative for pain and visual disturbance.  Respiratory: Negative for cough, chest tightness and shortness of breath.   Cardiovascular: Negative for chest pain, palpitations and leg swelling.  Gastrointestinal: Negative for abdominal pain, diarrhea, nausea and vomiting.  Genitourinary: Negative for difficulty urinating and dysuria.  Musculoskeletal: Negative for back pain and joint swelling.  Skin: Negative for color change and rash.  Neurological: Negative for dizziness, light-headedness and headaches.  Hematological: Negative for adenopathy. Does not bruise/bleed easily.  Psychiatric/Behavioral: Negative for agitation and dysphoric mood.       Objective:     Blood pressure rechecked by me:   122/82  Physical Exam  Constitutional: She is oriented to person, place, and time. She appears well-developed and well-nourished. No distress.  HENT:  Nose: Nose normal.  Mouth/Throat: Oropharynx is clear and moist.  Eyes: Right eye exhibits no discharge. Left eye exhibits no discharge. No scleral icterus.  Neck: Neck supple. No thyromegaly present.  Cardiovascular: Normal rate and regular rhythm.   Pulmonary/Chest: Breath sounds normal. No accessory muscle usage. No tachypnea. No respiratory distress. She has no decreased breath sounds. She has no wheezes. She has no rhonchi. Right breast exhibits no inverted nipple, no mass, no nipple discharge and no tenderness (no axillary adenopathy). Left breast exhibits no inverted nipple, no mass, no nipple discharge and no tenderness (no axilarry adenopathy).  Abdominal: Soft. Bowel sounds are normal. There is no tenderness.  Musculoskeletal: She exhibits no edema or tenderness.  Lymphadenopathy:    She has no cervical adenopathy.  Neurological: She is alert and oriented to person, place, and time.  Skin: Skin is warm. No rash noted. No erythema.  Psychiatric: She has a normal mood and affect. Her behavior is normal.    BP 114/84 (BP Location: Left Arm, Patient Position: Sitting, Cuff Size: Normal)   Pulse 90   Temp 98.6 F (37 C) (Oral)   Ht 5\' 3"  (1.6 m)   Wt 158 lb 2 oz (71.7 kg)   SpO2 96%   BMI 28.01 kg/m  Wt Readings from Last 3 Encounters:  03/20/16 158 lb 2 oz (71.7 kg)  02/21/16 155 lb (70.3 kg)  09/15/15 153 lb 8 oz (69.6 kg)     Lab Results  Component Value Date   WBC 8.9 03/17/2016   HGB 14.4 03/17/2016   HCT 42.3 03/17/2016   PLT 441.0 (H) 03/17/2016   GLUCOSE 91 03/17/2016   CHOL 190 03/17/2016   TRIG 145.0 03/17/2016   HDL 52.60 03/17/2016   LDLDIRECT 176.0 09/24/2014   LDLCALC 108 (H) 03/17/2016   ALT 34 03/17/2016   AST 35 03/17/2016   NA 139 03/17/2016   K 4.2 03/17/2016   CL 103 03/17/2016   CREATININE  0.93 03/17/2016   BUN 17 03/17/2016   CO2 30 03/17/2016   TSH 0.84 09/15/2015       Assessment & Plan:   Problem List Items Addressed This Visit    Abnormal liver enzymes    Recent liver panel wnl.        Environmental allergies    Controlled.       Health care maintenance    Physical today 03/20/16.  PAP 03/04/14 - negative with negative HPV.  Mammogram 03/25/15 - birads II.  Scheduled for f/u mammogram.  Colonoscopy 05/26/15 - normal.  Follow up in 10 years.        History of abnormal mammogram  Scheduled for f/u mammogram 03/28/16.        Hypercholesterolemia    On crestor.  Low cholesterol diet and exercise.  Follow lipid panel and liver function tests.       Relevant Medications   lisinopril (PRINIVIL,ZESTRIL) 30 MG tablet   hydrochlorothiazide (HYDRODIURIL) 25 MG tablet   Hypertension    Blood pressure under good control.  Continue same medication regimen.  Follow pressures.  Follow metabolic panel.        Relevant Medications   lisinopril (PRINIVIL,ZESTRIL) 30 MG tablet   hydrochlorothiazide (HYDRODIURIL) 25 MG tablet    Other Visit Diagnoses    Thrombocytosis ([place])    -  Primary   recheck cbc.    Thrombocytopenia ([place])       Relevant Orders   Platelet count       Einar Pheasant, MD

## 2016-03-20 NOTE — Progress Notes (Signed)
Pre visit review using our clinic review tool, if applicable. No additional management support is needed unless otherwise documented below in the visit note. 

## 2016-03-20 NOTE — Assessment & Plan Note (Addendum)
Physical today 03/20/16.  PAP 03/04/14 - negative with negative HPV.  Mammogram 03/25/15 - birads II.  Scheduled for f/u mammogram.  Colonoscopy 05/26/15 - normal.  Follow up in 10 years.

## 2016-03-22 ENCOUNTER — Encounter: Payer: Self-pay | Admitting: Internal Medicine

## 2016-03-22 MED ORDER — HYDROCHLOROTHIAZIDE 25 MG PO TABS: 25.0000 mg | ORAL_TABLET | Freq: Every day | ORAL | 1 refills | Status: DC

## 2016-03-22 MED ORDER — LISINOPRIL 30 MG PO TABS: 30.0000 mg | ORAL_TABLET | Freq: Every day | ORAL | 1 refills | Status: DC

## 2016-03-22 NOTE — Assessment & Plan Note (Signed)
Controlled.  

## 2016-03-22 NOTE — Assessment & Plan Note (Signed)
Scheduled for f/u mammogram 03/28/16.

## 2016-03-22 NOTE — Assessment & Plan Note (Signed)
Blood pressure under good control.  Continue same medication regimen.  Follow pressures.  Follow metabolic panel.   

## 2016-03-22 NOTE — Assessment & Plan Note (Signed)
On crestor.  Low cholesterol diet and exercise.  Follow lipid panel and liver function tests.   

## 2016-03-22 NOTE — Assessment & Plan Note (Signed)
Recent liver panel wnl.  

## 2016-03-24 ENCOUNTER — Other Ambulatory Visit: Payer: BLUE CROSS/BLUE SHIELD

## 2016-03-28 ENCOUNTER — Ambulatory Visit
Admission: RE | Admit: 2016-03-28 | Discharge: 2016-03-28 | Disposition: A | Discharge status: Home / Self Care | Payer: BLUE CROSS/BLUE SHIELD | Source: Ambulatory Visit | Attending: Internal Medicine | Admitting: Internal Medicine

## 2016-03-28 DIAGNOSIS — Z1231 Encounter for screening mammogram for malignant neoplasm of breast: Secondary | ICD-10-CM | POA: Diagnosis not present

## 2016-03-28 DIAGNOSIS — Z1239 Encounter for other screening for malignant neoplasm of breast: Secondary | ICD-10-CM

## 2016-04-04 DIAGNOSIS — D696 Thrombocytopenia, unspecified: Secondary | ICD-10-CM | POA: Diagnosis not present

## 2016-04-05 DIAGNOSIS — E782 Mixed hyperlipidemia: Secondary | ICD-10-CM | POA: Diagnosis not present

## 2016-04-05 DIAGNOSIS — J301 Allergic rhinitis due to pollen: Secondary | ICD-10-CM | POA: Diagnosis not present

## 2016-04-05 DIAGNOSIS — E663 Overweight: Secondary | ICD-10-CM | POA: Diagnosis not present

## 2016-04-05 DIAGNOSIS — I10 Essential (primary) hypertension: Secondary | ICD-10-CM | POA: Diagnosis not present

## 2016-04-05 LAB — PLATELET COUNT: Platelets: 437 10*3/uL — ABNORMAL HIGH (ref 150–379)

## 2016-04-11 ENCOUNTER — Other Ambulatory Visit: Payer: Self-pay | Admitting: Internal Medicine

## 2016-04-11 DIAGNOSIS — R7989 Other specified abnormal findings of blood chemistry: Secondary | ICD-10-CM

## 2016-04-11 NOTE — Progress Notes (Signed)
Orders placed for f/u labs.  

## 2016-04-13 ENCOUNTER — Encounter: Payer: BLUE CROSS/BLUE SHIELD | Admitting: Internal Medicine

## 2016-05-03 DIAGNOSIS — C44719 Basal cell carcinoma of skin of left lower limb, including hip: Secondary | ICD-10-CM | POA: Diagnosis not present

## 2016-05-03 DIAGNOSIS — Z85828 Personal history of other malignant neoplasm of skin: Secondary | ICD-10-CM | POA: Diagnosis not present

## 2016-05-03 DIAGNOSIS — C44712 Basal cell carcinoma of skin of right lower limb, including hip: Secondary | ICD-10-CM | POA: Diagnosis not present

## 2016-06-14 DIAGNOSIS — J301 Allergic rhinitis due to pollen: Secondary | ICD-10-CM | POA: Diagnosis not present

## 2016-06-14 DIAGNOSIS — I10 Essential (primary) hypertension: Secondary | ICD-10-CM | POA: Diagnosis not present

## 2016-06-14 DIAGNOSIS — E782 Mixed hyperlipidemia: Secondary | ICD-10-CM | POA: Diagnosis not present

## 2016-06-14 DIAGNOSIS — E663 Overweight: Secondary | ICD-10-CM | POA: Diagnosis not present

## 2016-08-06 ENCOUNTER — Other Ambulatory Visit: Payer: Self-pay | Admitting: Internal Medicine

## 2016-08-09 DIAGNOSIS — J019 Acute sinusitis, unspecified: Secondary | ICD-10-CM | POA: Diagnosis not present

## 2016-08-09 DIAGNOSIS — I10 Essential (primary) hypertension: Secondary | ICD-10-CM | POA: Diagnosis not present

## 2016-08-09 DIAGNOSIS — E663 Overweight: Secondary | ICD-10-CM | POA: Diagnosis not present

## 2016-09-21 ENCOUNTER — Ambulatory Visit: Payer: BLUE CROSS/BLUE SHIELD | Admitting: Internal Medicine

## 2016-10-12 DIAGNOSIS — Z6827 Body mass index (BMI) 27.0-27.9, adult: Secondary | ICD-10-CM | POA: Diagnosis not present

## 2016-10-12 DIAGNOSIS — E663 Overweight: Secondary | ICD-10-CM | POA: Diagnosis not present

## 2016-10-12 DIAGNOSIS — I10 Essential (primary) hypertension: Secondary | ICD-10-CM | POA: Diagnosis not present

## 2016-11-10 ENCOUNTER — Ambulatory Visit: Payer: BLUE CROSS/BLUE SHIELD | Admitting: Internal Medicine

## 2017-01-08 ENCOUNTER — Ambulatory Visit: Payer: BLUE CROSS/BLUE SHIELD | Admitting: Internal Medicine

## 2017-01-10 ENCOUNTER — Ambulatory Visit (INDEPENDENT_AMBULATORY_CARE_PROVIDER_SITE_OTHER): Payer: BLUE CROSS/BLUE SHIELD

## 2017-01-10 DIAGNOSIS — Z23 Encounter for immunization: Secondary | ICD-10-CM

## 2017-01-11 DIAGNOSIS — R609 Edema, unspecified: Secondary | ICD-10-CM | POA: Diagnosis not present

## 2017-01-11 DIAGNOSIS — I10 Essential (primary) hypertension: Secondary | ICD-10-CM | POA: Diagnosis not present

## 2017-01-11 DIAGNOSIS — Z6829 Body mass index (BMI) 29.0-29.9, adult: Secondary | ICD-10-CM | POA: Diagnosis not present

## 2017-01-11 DIAGNOSIS — J301 Allergic rhinitis due to pollen: Secondary | ICD-10-CM | POA: Diagnosis not present

## 2017-02-07 ENCOUNTER — Other Ambulatory Visit: Payer: Self-pay | Admitting: Internal Medicine

## 2017-02-09 DIAGNOSIS — J309 Allergic rhinitis, unspecified: Secondary | ICD-10-CM | POA: Diagnosis not present

## 2017-02-09 DIAGNOSIS — E782 Mixed hyperlipidemia: Secondary | ICD-10-CM | POA: Diagnosis not present

## 2017-02-09 DIAGNOSIS — E663 Overweight: Secondary | ICD-10-CM | POA: Diagnosis not present

## 2017-02-09 DIAGNOSIS — I10 Essential (primary) hypertension: Secondary | ICD-10-CM | POA: Diagnosis not present

## 2017-03-05 ENCOUNTER — Other Ambulatory Visit: Payer: Self-pay | Admitting: Nurse Practitioner

## 2017-03-05 DIAGNOSIS — Z1231 Encounter for screening mammogram for malignant neoplasm of breast: Secondary | ICD-10-CM

## 2017-03-26 DIAGNOSIS — E782 Mixed hyperlipidemia: Secondary | ICD-10-CM | POA: Diagnosis not present

## 2017-03-26 DIAGNOSIS — Z0001 Encounter for general adult medical examination with abnormal findings: Secondary | ICD-10-CM | POA: Diagnosis not present

## 2017-03-26 DIAGNOSIS — I10 Essential (primary) hypertension: Secondary | ICD-10-CM | POA: Diagnosis not present

## 2017-03-26 DIAGNOSIS — E559 Vitamin D deficiency, unspecified: Secondary | ICD-10-CM | POA: Diagnosis not present

## 2017-03-29 ENCOUNTER — Ambulatory Visit (INDEPENDENT_AMBULATORY_CARE_PROVIDER_SITE_OTHER): Payer: BLUE CROSS/BLUE SHIELD | Admitting: Nurse Practitioner

## 2017-03-29 ENCOUNTER — Ambulatory Visit
Admission: RE | Admit: 2017-03-29 | Discharge: 2017-03-29 | Disposition: A | Discharge status: Home / Self Care | Payer: BLUE CROSS/BLUE SHIELD | Source: Ambulatory Visit | Attending: Nurse Practitioner | Admitting: Nurse Practitioner

## 2017-03-29 ENCOUNTER — Encounter: Payer: Self-pay | Admitting: Nurse Practitioner

## 2017-03-29 VITALS — BP 120/80 | HR 90 | Resp 16 | Ht 64.0 in | Wt 157.8 lb

## 2017-03-29 DIAGNOSIS — E782 Mixed hyperlipidemia: Secondary | ICD-10-CM

## 2017-03-29 DIAGNOSIS — J301 Allergic rhinitis due to pollen: Secondary | ICD-10-CM | POA: Diagnosis not present

## 2017-03-29 DIAGNOSIS — I10 Essential (primary) hypertension: Secondary | ICD-10-CM

## 2017-03-29 DIAGNOSIS — R928 Other abnormal and inconclusive findings on diagnostic imaging of breast: Secondary | ICD-10-CM | POA: Insufficient documentation

## 2017-03-29 DIAGNOSIS — Z1231 Encounter for screening mammogram for malignant neoplasm of breast: Secondary | ICD-10-CM | POA: Diagnosis not present

## 2017-03-29 DIAGNOSIS — E663 Overweight: Secondary | ICD-10-CM

## 2017-03-29 MED ORDER — PHENTERMINE HCL 37.5 MG PO TABS: 37.5000 mg | ORAL_TABLET | Freq: Every day | ORAL | 1 refills | Status: DC

## 2017-03-29 NOTE — Progress Notes (Addendum)
[facility] [place], [place] [address]  Internal MEDICINE  Office Visit Note  Patient Name: Rhonda Sanders  [id number]  [id number]  Date of Service: 04/05/2017  No chief complaint on file.   The patient is here for routine follow up. She is currently talking phentermine as an appetite suppressant. She has started a new job and restarted her exercise regimen. She is following a 1200 calorie diet. She has lost 4 pounds since her last visit. She has no complaints regarding the phentermine.    Medication Refill  This is a chronic problem. The current episode started more than 1 month ago. The problem occurs constantly. The problem has been gradually improving. Pertinent negatives include no abdominal pain, arthralgias, chest pain, chills, congestion, coughing, fatigue, headaches, joint swelling, nausea, neck pain, numbness, rash, sore throat or vomiting. Treatments tried: appetite suppressant, increased exercise, and low-calorie diet.  The treatment provided moderate relief.    Pt is here for routine follow up.    Current Medication: Outpatient Encounter Medications as of 03/29/2017  Medication Sig  . cetirizine (ZYRTEC) 10 MG tablet Take 10 mg by mouth daily.  . hydrochlorothiazide (HYDRODIURIL) 25 MG tablet Take 1 tablet (25 mg total) by mouth daily.  Marland Kitchen lisinopril (PRINIVIL,ZESTRIL) 30 MG tablet Take 1 tablet (30 mg total) by mouth daily.  . Magnesium 500 MG TABS Take by mouth daily.  . montelukast (SINGULAIR) 10 MG tablet Take 1 tablet (10 mg total) by mouth at bedtime.  . Multiple Vitamin (MULTIVITAMIN) tablet Take 1 tablet by mouth daily.  . phentermine (ADIPEX-P) 37.5 MG tablet Take 1 tablet (37.5 mg total) by mouth daily before breakfast.  . rosuvastatin (CRESTOR) 5 MG tablet TAKE 1 TABLET BY MOUTH DAILY  . [DISCONTINUED] phentermine (ADIPEX-P) 37.5 MG tablet TAKE 1 TABLET BY MOUTH DAILY FOR WEIGHT LOSS   No facility-administered encounter medications on  file as of 03/29/2017.     Surgical History: Past Surgical History:  Procedure Laterality Date  . ABDOMINAL HYSTERECTOMY  04/2006   partial  . BREAST BIOPSY Right 2014   CORE W/CLIP - NEG  . BREAST BIOPSY Right 2015   CORE W/OUT CLIP - NEG  . BREAST EXCISIONAL BIOPSY Right 2010   EXCISIONAL -ALH, columnar cell hyperplasia  . COLONOSCOPY WITH PROPOFOL N/A 05/26/2015   Procedure: COLONOSCOPY WITH PROPOFOL;  Surgeon: Robert Bellow, MD;  Location: [facility] ENDOSCOPY;  Service: Endoscopy;  Laterality: N/A;    Medical History: Past Medical History:  Diagnosis Date  . Atypical lobular hyperplasia of right breast 12/25/2008   Sngle focus noted in area of columnar cell hyperplasia  . Hypercholesterolemia   . Hypertension   . PONV (postoperative nausea and vomiting)     Family History: Family History  Problem Relation Age of Onset  . Breast cancer Mother 3  . Heart disease Father        first MI (56s), died age 33 - MI  . Heart disease Paternal Grandfather   . Heart disease Paternal Uncle   . CVA Paternal Grandmother   . Breast cancer Maternal Aunt 36  . Crohn's disease Brother   . Colon cancer Neg Hx     Social History   Socioeconomic History  . Marital status: Single    Spouse name: Not on file  . Number of children: Not on file  . Years of education: Not on file  . Highest education level: Not on file  Social Needs  . Financial resource strain: Not  on file  . Food insecurity - worry: Not on file  . Food insecurity - inability: Not on file  . Transportation needs - medical: Not on file  . Transportation needs - non-medical: Not on file  Occupational History  . Not on file  Tobacco Use  . Smoking status: Never Smoker  . Smokeless tobacco: Never Used  Substance and Sexual Activity  . Alcohol use: Yes    Alcohol/week: 0.0 oz    Comment: occasional  . Drug use: No  . Sexual activity: Not on file  Other Topics Concern  . Not on file  Social History Narrative  .  Not on file      Review of Systems  Constitutional: Negative for activity change, appetite change, chills, fatigue and unexpected weight change.  HENT: Negative for congestion, postnasal drip, rhinorrhea, sneezing and sore throat.   Eyes: Negative.  Negative for redness.  Respiratory: Negative for cough, chest tightness, shortness of breath and wheezing.   Cardiovascular: Negative for chest pain and palpitations.  Gastrointestinal: Negative for abdominal pain, constipation, diarrhea, nausea and vomiting.  Endocrine: Negative for cold intolerance, heat intolerance, polydipsia, polyphagia and polyuria.  Genitourinary: Negative.  Negative for dysuria and frequency.  Musculoskeletal: Negative for arthralgias, back pain, joint swelling and neck pain.  Skin: Negative for color change, pallor, rash and wound.  Allergic/Immunologic: Positive for environmental allergies.  Neurological: Negative for tremors, numbness and headaches.  Hematological: Negative for adenopathy. Does not bruise/bleed easily.  Psychiatric/Behavioral: Negative for agitation, behavioral problems (Depression), sleep disturbance and suicidal ideas.    Today's Vitals   03/29/17 1544  BP: 120/80  Pulse: 90  Resp: 16  SpO2: 98%  Weight: 157 lb 12.8 oz (71.6 kg)  Height: 5\' 4"  (1.626 m)    Physical Exam  Constitutional: She is oriented to person, place, and time. She appears well-developed and well-nourished. No distress.  HENT:  Head: Normocephalic and atraumatic.  Mouth/Throat: Oropharynx is clear and moist. No oropharyngeal exudate.  Eyes: EOM are normal. Pupils are equal, round, and reactive to light.  Neck: Normal range of motion. Neck supple. No JVD present. Carotid bruit is not present. No tracheal deviation present. No thyromegaly present.  Cardiovascular: Normal rate, regular rhythm and normal heart sounds. Exam reveals no gallop and no friction rub.  No murmur heard. Pulmonary/Chest: Effort normal and breath  sounds normal. No respiratory distress. She has no wheezes. She has no rales. She exhibits no tenderness.  Abdominal: Soft. Bowel sounds are normal. There is no tenderness.  Musculoskeletal: Normal range of motion.  Lymphadenopathy:    She has no cervical adenopathy.  Neurological: She is alert and oriented to person, place, and time. No cranial nerve deficit.  Skin: Skin is warm and dry. She is not diaphoretic.  Psychiatric: She has a normal mood and affect. Her behavior is normal. Judgment and thought content normal.  Nursing note and vitals reviewed.   Assessment/Plan:  1. Overweight Improved, with 4 pound weight loss since last visit.  - phentermine (ADIPEX-P) 37.5 MG tablet; Take 1 tablet (37.5 mg total) by mouth daily before breakfast.  Dispense: 30 tablet; Refill: 1  2. Essential hypertension Stable. Continue bp medication as prescribed.   3. Mixed hyperlipidemia Continue crestor as prescribed.  4. Non-seasonal allergic rhinitis due to pollen Doing well. Continue all allergy medication as prescribed.   General Counseling: jakara blatter understanding of the findings of todays visit and agrees with plan of treatment. I have discussed any further diagnostic evaluation that  may be needed or ordered today. We also reviewed her medications today. she has been encouraged to call the office with any questions or concerns that should arise related to todays visit.  Counseling:   There is a liability release in patients' chart. There has been a 10 minute discussion about the side effects including but not limited to elevated blood pressure, anxiety, lack of sleep and dry mouth. Pt understands and will like to start/continue on appetite suppressant at this time. There will be one month RX given at the time of visit with proper follow up. Nova diet plan with restricted calories is given to the pt. Pt understands and agrees with  plan of treatment  This patient was seen by Leretha Pol, FNP- C in Collaboration with Dr Lavera Guise as a part of collaborative care agreement  Meds ordered this encounter  Medications  . phentermine (ADIPEX-P) 37.5 MG tablet    Sig: Take 1 tablet (37.5 mg total) by mouth daily before breakfast.    Dispense:  30 tablet    Refill:  1    Order Specific Question:   Supervising Provider    Answer:   Lavera Guise [[id number]]    Time spent: 64 Minutes    She should return to the office in 6 weeks for follow up       Dr Lavera Guise Internal medicine

## 2017-04-05 ENCOUNTER — Other Ambulatory Visit: Payer: Self-pay | Admitting: Nurse Practitioner

## 2017-04-05 DIAGNOSIS — E782 Mixed hyperlipidemia: Secondary | ICD-10-CM | POA: Insufficient documentation

## 2017-04-05 DIAGNOSIS — R928 Other abnormal and inconclusive findings on diagnostic imaging of breast: Secondary | ICD-10-CM

## 2017-04-05 DIAGNOSIS — N6489 Other specified disorders of breast: Secondary | ICD-10-CM

## 2017-04-05 DIAGNOSIS — E663 Overweight: Secondary | ICD-10-CM | POA: Insufficient documentation

## 2017-04-05 DIAGNOSIS — J301 Allergic rhinitis due to pollen: Secondary | ICD-10-CM | POA: Insufficient documentation

## 2017-04-06 ENCOUNTER — Ambulatory Visit
Admission: RE | Admit: 2017-04-06 | Discharge: 2017-04-06 | Disposition: A | Discharge status: Home / Self Care | Payer: BLUE CROSS/BLUE SHIELD | Source: Ambulatory Visit | Attending: Nurse Practitioner | Admitting: Nurse Practitioner

## 2017-04-06 DIAGNOSIS — N6313 Unspecified lump in the right breast, lower outer quadrant: Secondary | ICD-10-CM | POA: Diagnosis not present

## 2017-04-06 DIAGNOSIS — R922 Inconclusive mammogram: Secondary | ICD-10-CM | POA: Diagnosis not present

## 2017-04-06 DIAGNOSIS — N6489 Other specified disorders of breast: Secondary | ICD-10-CM

## 2017-04-06 DIAGNOSIS — R928 Other abnormal and inconclusive findings on diagnostic imaging of breast: Secondary | ICD-10-CM

## 2017-04-13 ENCOUNTER — Ambulatory Visit: Payer: Self-pay | Admitting: Internal Medicine

## 2017-04-13 ENCOUNTER — Encounter: Payer: Self-pay | Admitting: General Surgery

## 2017-04-13 ENCOUNTER — Encounter: Payer: Self-pay | Admitting: Internal Medicine

## 2017-04-13 VITALS — BP 118/74 | HR 89 | Temp 98.6°F | Resp 16 | Wt 158.8 lb

## 2017-04-13 DIAGNOSIS — R928 Other abnormal and inconclusive findings on diagnostic imaging of breast: Secondary | ICD-10-CM

## 2017-04-13 DIAGNOSIS — E7800 Pure hypercholesterolemia, unspecified: Secondary | ICD-10-CM

## 2017-04-13 DIAGNOSIS — N898 Other specified noninflammatory disorders of vagina: Secondary | ICD-10-CM

## 2017-04-13 DIAGNOSIS — I10 Essential (primary) hypertension: Secondary | ICD-10-CM

## 2017-04-13 DIAGNOSIS — Z87898 Personal history of other specified conditions: Secondary | ICD-10-CM

## 2017-04-13 DIAGNOSIS — R748 Abnormal levels of other serum enzymes: Secondary | ICD-10-CM

## 2017-04-13 NOTE — Assessment & Plan Note (Signed)
Recheck liver panel.  

## 2017-04-13 NOTE — Assessment & Plan Note (Signed)
Blood pressure under good control.  Continue same medication regimen.  Follow pressures.  Follow metabolic panel.   

## 2017-04-13 NOTE — Progress Notes (Signed)
Patient ID: Rhonda Sanders, female   DOB: 1960-11-27, 57 y.o.   MRN: [id number]   Subjective:    Patient ID: Rhonda Sanders, female    DOB: 06-May-1960, 57 y.o.   MRN: [id number]  HPI  Patient here for a scheduled follow up.  She reports she is doing well.  Feels better.  Got a new job recently.  Going well.  Decreased stress.  Had abnormal mammogram 03/29/17.  F/u views 04/06/17 recommended 6 month f/u.  Discussed with her today.  She prefers referral to Dr Bary Castilla for evaluation.  No chest pain.  No sob.  Exercising.  No abdominal pain.  Bowels moving.  Took phentermine previously.  Off now.  States just wanted something to jump start her.  Overall she feels she is doing wll.     Past Medical History:  Diagnosis Date  . Atypical lobular hyperplasia of right breast 12/25/2008   Sngle focus noted in area of columnar cell hyperplasia  . Hypercholesterolemia   . Hypertension   . PONV (postoperative nausea and vomiting)    Past Surgical History:  Procedure Laterality Date  . ABDOMINAL HYSTERECTOMY  04/2006   partial  . BREAST BIOPSY Right 2014   CORE W/CLIP - NEG  . BREAST BIOPSY Right 2015   CORE W/OUT CLIP - NEG COLUMNAR CELL CHANGE WITH MICRO CALCIFICATIONS.   Marland Kitchen BREAST EXCISIONAL BIOPSY Right 2010   EXCISIONAL -ALH, columnar cell hyperplasia  . COLONOSCOPY WITH PROPOFOL N/A 05/26/2015   Procedure: COLONOSCOPY WITH PROPOFOL;  Surgeon: Robert Bellow, MD;  Location: [facility] ENDOSCOPY;  Service: Endoscopy;  Laterality: N/A;   Family History  Problem Relation Age of Onset  . Breast cancer Mother 11  . Heart disease Father        first MI (84s), died age 4 - MI  . Heart disease Paternal Grandfather   . Heart disease Paternal Uncle   . CVA Paternal Grandmother   . Breast cancer Maternal Aunt 64  . Crohn's disease Brother   . Colon cancer Neg Hx    Social History   Socioeconomic History  . Marital status: Single    Spouse name: None  . Number of children: None  . Years of education:  None  . Highest education level: None  Social Needs  . Financial resource strain: None  . Food insecurity - worry: None  . Food insecurity - inability: None  . Transportation needs - medical: None  . Transportation needs - non-medical: None  Occupational History  . None  Tobacco Use  . Smoking status: Never Smoker  . Smokeless tobacco: Never Used  Substance and Sexual Activity  . Alcohol use: Yes    Alcohol/week: 0.0 oz    Comment: occasional  . Drug use: No  . Sexual activity: None  Other Topics Concern  . None  Social History Narrative  . None    Outpatient Encounter Medications as of 04/13/2017  Medication Sig  . cetirizine (ZYRTEC) 10 MG tablet Take 10 mg by mouth daily.  . hydrochlorothiazide (HYDRODIURIL) 25 MG tablet Take 1 tablet (25 mg total) by mouth daily.  Marland Kitchen lisinopril (PRINIVIL,ZESTRIL) 30 MG tablet Take 1 tablet (30 mg total) by mouth daily.  . Magnesium 500 MG TABS Take by mouth daily.  . montelukast (SINGULAIR) 10 MG tablet Take 1 tablet (10 mg total) by mouth at bedtime.  . Multiple Vitamin (MULTIVITAMIN) tablet Take 1 tablet by mouth daily.  . rosuvastatin (CRESTOR) 5 MG tablet TAKE 1 TABLET  BY MOUTH DAILY  . phentermine (ADIPEX-P) 37.5 MG tablet Take 1 tablet (37.5 mg total) by mouth daily before breakfast. (Patient not taking: Reported on 04/13/2017)   No facility-administered encounter medications on file as of 04/13/2017.     Review of Systems  Constitutional: Negative for appetite change and unexpected weight change.  HENT: Negative for congestion and sinus pressure.   Respiratory: Negative for cough, chest tightness and shortness of breath.   Cardiovascular: Negative for chest pain, palpitations and leg swelling.  Gastrointestinal: Negative for abdominal pain, diarrhea, nausea and vomiting.  Genitourinary: Negative for difficulty urinating and dysuria.  Musculoskeletal: Negative for joint swelling and myalgias.  Skin: Negative for color change and  rash.  Neurological: Negative for dizziness, light-headedness and headaches.  Psychiatric/Behavioral: Negative for agitation and dysphoric mood.       Objective:     Blood pressure rechecked by me:  126/78  Physical Exam  Constitutional: She appears well-developed and well-nourished. No distress.  HENT:  Nose: Nose normal.  Mouth/Throat: Oropharynx is clear and moist.  Neck: Neck supple. No thyromegaly present.  Cardiovascular: Normal rate and regular rhythm.  Pulmonary/Chest: Breath sounds normal. No respiratory distress. She has no wheezes.  Abdominal: Soft. Bowel sounds are normal. There is no tenderness.  Musculoskeletal: She exhibits no edema or tenderness.  Lymphadenopathy:    She has no cervical adenopathy.  Skin: No rash noted. No erythema.  Psychiatric: She has a normal mood and affect.    BP 118/74 (BP Location: Left Arm, Patient Position: Sitting, Cuff Size: Normal)   Pulse 89   Temp 98.6 F (37 C) (Oral)   Resp 16   Wt 158 lb 12.8 oz (72 kg)   SpO2 98%   BMI 27.26 kg/m  Wt Readings from Last 3 Encounters:  04/13/17 158 lb 12.8 oz (72 kg)  03/29/17 157 lb 12.8 oz (71.6 kg)  03/20/16 158 lb 2 oz (71.7 kg)     Lab Results  Component Value Date   WBC 8.9 03/17/2016   HGB 14.4 03/17/2016   HCT 42.3 03/17/2016   PLT 437 (H) 04/04/2016   GLUCOSE 91 03/17/2016   CHOL 190 03/17/2016   TRIG 145.0 03/17/2016   HDL 52.60 03/17/2016   LDLDIRECT 176.0 09/24/2014   LDLCALC 108 (H) 03/17/2016   ALT 34 03/17/2016   AST 35 03/17/2016   NA 139 03/17/2016   K 4.2 03/17/2016   CL 103 03/17/2016   CREATININE 0.93 03/17/2016   BUN 17 03/17/2016   CO2 30 03/17/2016   TSH 0.84 09/15/2015    US Breast Ltd Uni Right Inc Axilla  Result Date: 04/06/2017 CLINICAL DATA:  Patient recalled from screening for right breast mass. EXAM: 2D DIGITAL DIAGNOSTIC RIGHT MAMMOGRAM WITH CAD AND ADJUNCT TOMO ULTRASOUND RIGHT BREAST COMPARISON:  Previous exam(s). ACR Breast Density  Category c: The breast tissue is heterogeneously dense, which may obscure small masses. FINDINGS: Within the lateral right breast posterior depth there is a small low-density circumscribed mass, further evaluated with spot compression and tomosynthesis images. Mammographic images were processed with CAD. On physical exam, I palpate no discrete mass lateral right breast. Targeted ultrasound is performed, showing a 6 x 6 x 3 mm oval circumscribed hypoechoic mass right breast 8:00 position 6 cm from nipple, favored represent a complicated cyst. IMPRESSION: Probably benign right breast mass, favored to represent a complicated cyst. RECOMMENDATION: Right breast diagnostic ultrasound in 6 months to ensure stability of probably benign right breast mass 8:00 position. I have  discussed the findings and recommendations with the patient. Results were also provided in writing at the conclusion of the visit. If applicable, a reminder letter will be sent to the patient regarding the next appointment. BI-RADS CATEGORY  3: Probably benign. Electronically Signed   By: Lovey Newcomer M.D.   On: 04/06/2017 08:35   Mm Diag Breast Tomo Uni Right  Result Date: 04/06/2017 CLINICAL DATA:  Patient recalled from screening for right breast mass. EXAM: 2D DIGITAL DIAGNOSTIC RIGHT MAMMOGRAM WITH CAD AND ADJUNCT TOMO ULTRASOUND RIGHT BREAST COMPARISON:  Previous exam(s). ACR Breast Density Category c: The breast tissue is heterogeneously dense, which may obscure small masses. FINDINGS: Within the lateral right breast posterior depth there is a small low-density circumscribed mass, further evaluated with spot compression and tomosynthesis images. Mammographic images were processed with CAD. On physical exam, I palpate no discrete mass lateral right breast. Targeted ultrasound is performed, showing a 6 x 6 x 3 mm oval circumscribed hypoechoic mass right breast 8:00 position 6 cm from nipple, favored represent a complicated cyst. IMPRESSION:  Probably benign right breast mass, favored to represent a complicated cyst. RECOMMENDATION: Right breast diagnostic ultrasound in 6 months to ensure stability of probably benign right breast mass 8:00 position. I have discussed the findings and recommendations with the patient. Results were also provided in writing at the conclusion of the visit. If applicable, a reminder letter will be sent to the patient regarding the next appointment. BI-RADS CATEGORY  3: Probably benign. Electronically Signed   By: Lovey Newcomer M.D.   On: 04/06/2017 08:35       Assessment & Plan:   Problem List Items Addressed This Visit    Abnormal liver enzymes    Recheck liver panel.        Essential hypertension    Blood pressure under good control.  Continue same medication regimen.  Follow pressures.  Follow metabolic panel.        Relevant Orders   CBC with Differential/Platelet   TSH   Basic metabolic panel   History of abnormal mammogram    Just had mammogram 03/29/17 - Birads 0.  Recommended f/u right breast mammogram and ultrasound.  Performed 04/2017 - Birads III.  Discussed with her today.  Will refer to Dr Bary Castilla for review.        Hypercholesterolemia    On crestor.  Low cholesterol diet and exercise.  Follow lipid panel and liver function tests.        Relevant Orders   Hepatic function panel   Lipid panel   Vaginal cyst    Resolved.        Other Visit Diagnoses    Abnormal mammogram    -  Primary   Relevant Orders   Ambulatory referral to General Surgery       Einar Pheasant, MD

## 2017-04-13 NOTE — Assessment & Plan Note (Signed)
On crestor.  Low cholesterol diet and exercise.  Follow lipid panel and liver function tests.   

## 2017-04-13 NOTE — Assessment & Plan Note (Signed)
Just had mammogram 03/29/17 - Birads 0.  Recommended f/u right breast mammogram and ultrasound.  Performed 04/2017 - Birads III.  Discussed with her today.  Will refer to Dr Bary Castilla for review.

## 2017-04-16 ENCOUNTER — Encounter: Payer: Self-pay | Admitting: Internal Medicine

## 2017-04-16 NOTE — Assessment & Plan Note (Signed)
Resolved

## 2017-05-01 ENCOUNTER — Encounter: Payer: Self-pay | Admitting: General Surgery

## 2017-05-01 ENCOUNTER — Ambulatory Visit: Payer: BLUE CROSS/BLUE SHIELD | Admitting: General Surgery

## 2017-05-01 VITALS — BP 136/76 | HR 92 | Resp 12 | Ht 64.0 in | Wt 160.0 lb

## 2017-05-01 DIAGNOSIS — N6313 Unspecified lump in the right breast, lower outer quadrant: Secondary | ICD-10-CM

## 2017-05-01 NOTE — Progress Notes (Signed)
Patient ID: LELLA MULLANY, female   DOB: 1960-12-01, 57 y.o.   MRN: [id number]  Chief Complaint  Patient presents with  . Follow-up    HPI MELISSE CAETANO is a 57 y.o. female who presents for a breast evaluation. The most recent mammogram was done on 03/29/2017 and 04/06/2017 with a right breast ultrasound.  Patient does perform regular self breast checks and gets regular mammograms done.  She can not feel anything different in the breast. She states Dr Nicki Reaper encouraged this follow up. She has had a right breast lumpectomy for [facility] in 2010 and right breast sterotatic biopsy in 2015 for increasing microcalcifications..    She is a Database administrator.  HPI  Past Medical History:  Diagnosis Date  . Atypical lobular hyperplasia of right breast 12/25/2008   Sngle focus noted in area of columnar cell hyperplasia  . Hypercholesterolemia   . Hypertension   . PONV (postoperative nausea and vomiting)     Past Surgical History:  Procedure Laterality Date  . ABDOMINAL HYSTERECTOMY  04/2006   partial  . BREAST BIOPSY Right 2014   CORE W/CLIP - NEG  . BREAST BIOPSY Right 2015   CORE W/OUT CLIP - NEG COLUMNAR CELL CHANGE WITH MICRO CALCIFICATIONS.   Marland Kitchen BREAST EXCISIONAL BIOPSY Right 2010   EXCISIONAL -ALH, columnar cell hyperplasia  . COLONOSCOPY WITH PROPOFOL N/A 05/26/2015   Procedure: COLONOSCOPY WITH PROPOFOL;  Surgeon: Robert Bellow, MD;  Location: [facility] ENDOSCOPY;  Service: Endoscopy;  Laterality: N/A;    Family History  Problem Relation Age of Onset  . Breast cancer Mother 18  . Heart disease Father        first MI (68s), died age 18 - MI  . Heart disease Paternal Grandfather   . Heart disease Paternal Uncle   . CVA Paternal Grandmother   . Breast cancer Maternal Aunt 13  . Crohn's disease Brother   . Colon cancer Neg Hx     Social History Social History   Tobacco Use  . Smoking status: Never Smoker  . Smokeless tobacco: Never Used  Substance Use  Topics  . Alcohol use: Yes    Alcohol/week: 0.0 oz    Comment: occasional  . Drug use: No    No Known Allergies  Current Outpatient Medications  Medication Sig Dispense Refill  . cetirizine (ZYRTEC) 10 MG tablet Take 10 mg by mouth daily.    . hydrochlorothiazide (HYDRODIURIL) 25 MG tablet Take 1 tablet (25 mg total) by mouth daily. 90 tablet 1  . lisinopril (PRINIVIL,ZESTRIL) 30 MG tablet Take 1 tablet (30 mg total) by mouth daily. 90 tablet 1  . Magnesium 500 MG TABS Take by mouth daily.    . montelukast (SINGULAIR) 10 MG tablet Take 1 tablet (10 mg total) by mouth at bedtime. 90 tablet 3  . Multiple Vitamin (MULTIVITAMIN) tablet Take 1 tablet by mouth daily.    . phentermine (ADIPEX-P) 37.5 MG tablet Take 1 tablet (37.5 mg total) by mouth daily before breakfast. 30 tablet 1  . rosuvastatin (CRESTOR) 5 MG tablet TAKE 1 TABLET BY MOUTH DAILY 30 tablet 5   No current facility-administered medications for this visit.     Review of Systems Review of Systems  Constitutional: Negative.   Respiratory: Negative.   Cardiovascular: Negative.     Blood pressure 136/76, pulse 92, resp. rate 12, height 5\' 4"  (1.626 m), weight 160 lb (72.6 kg), SpO2 99 %.  Physical Exam Physical Exam  Constitutional:  She is oriented to person, place, and time. She appears well-developed and well-nourished.  HENT:  Mouth/Throat: Oropharynx is clear and moist.  Eyes: Conjunctivae are normal. No scleral icterus.  Neck: Neck supple.  Cardiovascular: Normal rate, regular rhythm and normal heart sounds.  Pulmonary/Chest: Effort normal and breath sounds normal. Right breast exhibits no inverted nipple, no mass, no nipple discharge, no skin change and no tenderness. Left breast exhibits no inverted nipple, no mass, no nipple discharge, no skin change and no tenderness.    Right breast lumpectomy site well healed.  Lymphadenopathy:    She has no cervical adenopathy.    She has no axillary adenopathy.   Neurological: She is alert and oriented to person, place, and time.  Skin: Skin is warm and dry.  Psychiatric: Her behavior is normal.    Data Reviewed April 06, 2017 right breast diagnostic mammogram and ultrasound reviewed.  Suspected complex cyst for which a six-month follow-up was recommended, BI-RADS-3.  Assessment    Past history of ADH of limited field, negative previous stereotactic biopsy for microcalcifications.    Plan Options for management were reviewed: FNA aspiration at this time or six-month follow-up.  The patient reports she is comfortable with observation.  Arrangements will be made for a repeat exam with mammography and ultrasound in 6 months.  While an appointment will be scheduled, the patient has been asked to call when the studies are completed so they can be reviewed, and possibly make the appointment unnecessary.  The patient has been asked to return to the office in six months with a unilateral right breast diagnostic mammogram. She will call after she has mammogram done for results.    HPI, Physical Exam, Assessment and Plan have been scribed under the direction and in the presence of Robert Bellow, MD. Karie Fetch, RN  I have completed the exam and reviewed the above documentation for accuracy and completeness.  I agree with the above.  Haematologist has been used and any errors in dictation or transcription are unintentional.  Hervey Ard, M.D., F.A.C.S.   Forest Gleason Toshika Parrow 05/02/2017, 9:11 AM

## 2017-05-01 NOTE — Patient Instructions (Addendum)
The patient is aware to call back for any questions or concerns. The patient has been asked to return to the office in six months with a unilateral right breast diagnostic mammogram. She will call after she has mammogram done for results.

## 2017-05-02 DIAGNOSIS — N6313 Unspecified lump in the right breast, lower outer quadrant: Secondary | ICD-10-CM | POA: Insufficient documentation

## 2017-05-14 ENCOUNTER — Ambulatory Visit: Payer: Self-pay | Admitting: Nurse Practitioner

## 2017-05-29 ENCOUNTER — Other Ambulatory Visit: Payer: Self-pay | Admitting: Internal Medicine

## 2017-06-08 ENCOUNTER — Encounter: Payer: Self-pay | Admitting: Nurse Practitioner

## 2017-06-08 ENCOUNTER — Ambulatory Visit: Payer: BLUE CROSS/BLUE SHIELD | Admitting: Nurse Practitioner

## 2017-06-08 VITALS — BP 144/91 | HR 95 | Resp 16 | Ht 63.5 in | Wt 159.2 lb

## 2017-06-08 DIAGNOSIS — E782 Mixed hyperlipidemia: Secondary | ICD-10-CM | POA: Diagnosis not present

## 2017-06-08 DIAGNOSIS — J301 Allergic rhinitis due to pollen: Secondary | ICD-10-CM | POA: Diagnosis not present

## 2017-06-08 DIAGNOSIS — I10 Essential (primary) hypertension: Secondary | ICD-10-CM

## 2017-06-08 DIAGNOSIS — E663 Overweight: Secondary | ICD-10-CM

## 2017-06-08 DIAGNOSIS — J0140 Acute pansinusitis, unspecified: Secondary | ICD-10-CM | POA: Diagnosis not present

## 2017-06-08 MED ORDER — PHENTERMINE HCL 37.5 MG PO TABS: 37.5000 mg | ORAL_TABLET | Freq: Every day | ORAL | 1 refills | Status: DC

## 2017-06-08 MED ORDER — CLARITHROMYCIN 500 MG PO TABS: 500.0000 mg | ORAL_TABLET | Freq: Two times a day (BID) | ORAL | 0 refills | Status: DC

## 2017-06-08 NOTE — Progress Notes (Signed)
[facility] [place], [place] [address]  Internal MEDICINE  Office Visit Note  Patient Name: Rhonda Sanders  [id number]  [id number]  Date of Service: 06/08/2017  Chief Complaint  Patient presents with  . Headache    sinus pressure  . Hypertension    Patient here for routine follow up visit. havig some issue with allergies, sinus pressure and headache. Has been off and on for past several days. Lots of post nasal drip in the mornings. She is otherwise feeling ok with no fever or nausea.  Weight gain of 2 pounds since her last visit. She has had a break from appetite suppressant. She would like to restart the appetite suppressant. Does well with this. Is going to the gym every day, generally after work. Diet choices are good.    Pt is here for routine follow up.    Current Medication: Outpatient Encounter Medications as of 06/08/2017  Medication Sig  . cetirizine (ZYRTEC) 10 MG tablet Take 10 mg by mouth daily.  . hydrochlorothiazide (HYDRODIURIL) 25 MG tablet TAKE ONE TABLET BY MOUTH ONCE DAILY  . lisinopril (PRINIVIL,ZESTRIL) 30 MG tablet Take 1 tablet (30 mg total) by mouth daily.  . Magnesium 500 MG TABS Take by mouth daily.  . montelukast (SINGULAIR) 10 MG tablet Take 1 tablet (10 mg total) by mouth at bedtime.  . Multiple Vitamin (MULTIVITAMIN) tablet Take 1 tablet by mouth daily.  . phentermine (ADIPEX-P) 37.5 MG tablet Take 1 tablet (37.5 mg total) by mouth daily before breakfast.  . rosuvastatin (CRESTOR) 5 MG tablet TAKE 1 TABLET BY MOUTH DAILY  . [DISCONTINUED] phentermine (ADIPEX-P) 37.5 MG tablet Take 1 tablet (37.5 mg total) by mouth daily before breakfast.  . clarithromycin (BIAXIN) 500 MG tablet Take 1 tablet (500 mg total) by mouth 2 (two) times daily.   No facility-administered encounter medications on file as of 06/08/2017.     Surgical History: Past Surgical History:  Procedure Laterality Date  . ABDOMINAL HYSTERECTOMY  04/2006   partial   . BREAST BIOPSY Right 2014   CORE W/CLIP - NEG  . BREAST BIOPSY Right 2015   CORE W/OUT CLIP - NEG COLUMNAR CELL CHANGE WITH MICRO CALCIFICATIONS.   Marland Kitchen BREAST EXCISIONAL BIOPSY Right 2010   EXCISIONAL -ALH, columnar cell hyperplasia  . COLONOSCOPY WITH PROPOFOL N/A 05/26/2015   Procedure: COLONOSCOPY WITH PROPOFOL;  Surgeon: Robert Bellow, MD;  Location: [facility] ENDOSCOPY;  Service: Endoscopy;  Laterality: N/A;    Medical History: Past Medical History:  Diagnosis Date  . Atypical lobular hyperplasia of right breast 12/25/2008   Sngle focus noted in area of columnar cell hyperplasia  . Hypercholesterolemia   . Hypertension   . PONV (postoperative nausea and vomiting)     Family History: Family History  Problem Relation Age of Onset  . Breast cancer Mother 60  . Heart disease Father        first MI (79s), died age 75 - [place]  . Heart disease Paternal Grandfather   . Heart disease Paternal Uncle   . CVA Paternal Grandmother   . Breast cancer Maternal Aunt 26  . Crohn's disease Brother   . Colon cancer Neg Hx     Social History   Socioeconomic History  . Marital status: Single    Spouse name: Not on file  . Number of children: Not on file  . Years of education: Not on file  . Highest education level: Not on file  Occupational History  .  Not on file  Social Needs  . Financial resource strain: Not on file  . Food insecurity:    Worry: Not on file    Inability: Not on file  . Transportation needs:    Medical: Not on file    Non-medical: Not on file  Tobacco Use  . Smoking status: Never Smoker  . Smokeless tobacco: Never Used  Substance and Sexual Activity  . Alcohol use: Yes    Alcohol/week: 0.0 oz    Comment: occasional  . Drug use: No  . Sexual activity: Not on file  Lifestyle  . Physical activity:    Days per week: Not on file    Minutes per session: Not on file  . Stress: Not on file  Relationships  . Social connections:    Talks on phone: Not on file     Gets together: Not on file    Attends religious service: Not on file    Active member of club or organization: Not on file    Attends meetings of clubs or organizations: Not on file    Relationship status: Not on file  . Intimate partner violence:    Fear of current or ex partner: Not on file    Emotionally abused: Not on file    Physically abused: Not on file    Forced sexual activity: Not on file  Other Topics Concern  . Not on file  Social History Narrative  . Not on file      Review of Systems  Constitutional: Positive for chills and fatigue.  HENT: Positive for congestion, ear pain, postnasal drip, rhinorrhea and sinus pain. Negative for sore throat and voice change.   Eyes: Positive for itching.  Respiratory: Positive for cough and wheezing.   Cardiovascular: Negative.   Gastrointestinal: Positive for nausea.  Endocrine: Negative.   Genitourinary: Negative.   Musculoskeletal: Negative.   Skin: Negative for rash.  Allergic/Immunologic: Positive for environmental allergies.  Neurological: Positive for headaches.  Hematological: Positive for adenopathy.  Psychiatric/Behavioral: Negative.     Today's Vitals   06/08/17 0935  BP: (!) 144/91  Pulse: 95  Resp: 16  SpO2: 98%  Weight: 159 lb 3.2 oz (72.2 kg)  Height: 5' 3.5" (1.613 m)    Physical Exam  Constitutional: She is oriented to person, place, and time. She appears well-developed and well-nourished. No distress.  HENT:  Head: Normocephalic and atraumatic.  Mouth/Throat: Oropharynx is clear and moist. No oropharyngeal exudate.  Eyes: Pupils are equal, round, and reactive to light. EOM are normal.  Neck: Normal range of motion. Neck supple. No JVD present. Carotid bruit is not present. No tracheal deviation present. No thyromegaly present.  Cardiovascular: Normal rate, regular rhythm and normal heart sounds. Exam reveals no gallop and no friction rub.  No murmur heard. Pulmonary/Chest: Effort normal and breath  sounds normal. No respiratory distress. She has no wheezes. She has no rales. She exhibits no tenderness.  Abdominal: Soft. Bowel sounds are normal. There is no tenderness.  Musculoskeletal: Normal range of motion.  Lymphadenopathy:    She has no cervical adenopathy.  Neurological: She is alert and oriented to person, place, and time. No cranial nerve deficit.  Skin: Skin is warm and dry. She is not diaphoretic.  Psychiatric: She has a normal mood and affect. Her behavior is normal. Judgment and thought content normal.  Nursing note and vitals reviewed.  Assessment/Plan: 1. Acute non-recurrent pansinusitis - clarithromycin (BIAXIN) 500 MG tablet; Take 1 tablet (500 mg total)  by mouth 2 (two) times daily.  Dispense: 20 tablet; Refill: 0  2. Non-seasonal allergic rhinitis due to pollen Continue allergy medication and nasal spray as previously prescribed  3. Overweight - phentermine (ADIPEX-P) 37.5 MG tablet; Take 1 tablet (37.5 mg total) by mouth daily before breakfast.  Dispense: 30 tablet; Refill: 1  4. Essential hypertension Stable. Continue bp medication as prescribed   5. Mixed hyperlipidemia Mild elevation of cholesterol panel. Continue crestor as prescribed. Prudent diet discussed. Continue with regular exercise and weight loss program.    General Counseling: ellean firman understanding of the findings of todays visit and agrees with plan of treatment. I have discussed any further diagnostic evaluation that may be needed or ordered today. We also reviewed her medications today. she has been encouraged to call the office with any questions or concerns that should arise related to todays visit.  Rest and increase fluids. Continue using OTC medication to control symptoms.  Meds ordered this encounter  Medications  . clarithromycin (BIAXIN) 500 MG tablet    Sig: Take 1 tablet (500 mg total) by mouth 2 (two) times daily.    Dispense:  20 tablet    Refill:  0    Order Specific  Question:   Supervising Provider    Answer:   Lavera Guise [[id number]]  . phentermine (ADIPEX-P) 37.5 MG tablet    Sig: Take 1 tablet (37.5 mg total) by mouth daily before breakfast.    Dispense:  30 tablet    Refill:  1    Order Specific Question:   Supervising Provider    Answer:   Lavera Guise [[id number]]    Time spent: 71 Minutes    Dr Lavera Guise Internal medicine

## 2017-06-17 ENCOUNTER — Other Ambulatory Visit: Payer: Self-pay | Admitting: Internal Medicine

## 2017-07-13 DIAGNOSIS — I10 Essential (primary) hypertension: Secondary | ICD-10-CM | POA: Diagnosis not present

## 2017-07-13 DIAGNOSIS — E7800 Pure hypercholesterolemia, unspecified: Secondary | ICD-10-CM | POA: Diagnosis not present

## 2017-07-14 LAB — CBC WITH DIFFERENTIAL/PLATELET
BASOS: 0 %
Basophils Absolute: 0 10*3/uL (ref 0.0–0.2)
EOS (ABSOLUTE): 0.1 10*3/uL (ref 0.0–0.4)
EOS: 1 %
HEMATOCRIT: 46.5 % (ref 34.0–46.6)
HEMOGLOBIN: 14.8 g/dL (ref 11.1–15.9)
Immature Grans (Abs): 0 10*3/uL (ref 0.0–0.1)
Immature Granulocytes: 0 %
LYMPHS ABS: 2.9 10*3/uL (ref 0.7–3.1)
Lymphs: 32 %
MCH: 30 pg (ref 26.6–33.0)
MCHC: 31.8 g/dL (ref 31.5–35.7)
MCV: 94 fL (ref 79–97)
MONOCYTES: 7 %
Monocytes Absolute: 0.6 10*3/uL (ref 0.1–0.9)
NEUTROS ABS: 5.5 10*3/uL (ref 1.4–7.0)
Neutrophils: 60 %
Platelets: 517 10*3/uL — ABNORMAL HIGH (ref 150–379)
RBC: 4.94 x10E6/uL (ref 3.77–5.28)
RDW: 13.6 % (ref 12.3–15.4)
WBC: 9.1 10*3/uL (ref 3.4–10.8)

## 2017-07-14 LAB — BASIC METABOLIC PANEL
BUN/Creatinine Ratio: 22 (ref 9–23)
BUN: 22 mg/dL (ref 6–24)
CALCIUM: 10.3 mg/dL — AB (ref 8.7–10.2)
CO2: 24 mmol/L (ref 20–29)
Chloride: 98 mmol/L (ref 96–106)
Creatinine, Ser: 1 mg/dL (ref 0.57–1.00)
GFR calc Af Amer: 74 mL/min/{1.73_m2} (ref >59)
GFR, EST NON AFRICAN AMERICAN: 64 mL/min/{1.73_m2} (ref >59)
GLUCOSE: 83 mg/dL (ref 65–99)
POTASSIUM: 4.9 mmol/L (ref 3.5–5.2)
Sodium: 141 mmol/L (ref 134–144)

## 2017-07-14 LAB — HEPATIC FUNCTION PANEL
ALK PHOS: 90 IU/L (ref 39–117)
ALT: 33 IU/L — ABNORMAL HIGH (ref 0–32)
AST: 33 IU/L (ref 0–40)
Albumin: 4.8 g/dL (ref 3.5–5.5)
BILIRUBIN, DIRECT: 0.14 mg/dL (ref 0.00–0.40)
Bilirubin Total: 0.5 mg/dL (ref 0.0–1.2)
Total Protein: 7.4 g/dL (ref 6.0–8.5)

## 2017-07-14 LAB — TSH: TSH: 1.68 u[IU]/mL (ref 0.450–4.500)

## 2017-07-14 LAB — LIPID PANEL
CHOLESTEROL TOTAL: 197 mg/dL (ref 100–199)
Chol/HDL Ratio: 3.7 ratio (ref 0.0–4.4)
HDL: 53 mg/dL (ref >39)
LDL Calculated: 110 mg/dL — ABNORMAL HIGH (ref 0–99)
TRIGLYCERIDES: 170 mg/dL — AB (ref 0–149)
VLDL Cholesterol Cal: 34 mg/dL (ref 5–40)

## 2017-07-15 ENCOUNTER — Other Ambulatory Visit: Payer: Self-pay | Admitting: Internal Medicine

## 2017-07-16 ENCOUNTER — Other Ambulatory Visit (HOSPITAL_COMMUNITY)
Admission: RE | Admit: 2017-07-16 | Discharge: 2017-07-16 | Disposition: A | Discharge status: Home / Self Care | Payer: 59 | Source: Ambulatory Visit | Attending: Internal Medicine | Admitting: Internal Medicine

## 2017-07-16 ENCOUNTER — Ambulatory Visit (INDEPENDENT_AMBULATORY_CARE_PROVIDER_SITE_OTHER): Payer: 59

## 2017-07-16 ENCOUNTER — Encounter: Payer: Self-pay | Admitting: Internal Medicine

## 2017-07-16 ENCOUNTER — Ambulatory Visit (INDEPENDENT_AMBULATORY_CARE_PROVIDER_SITE_OTHER): Payer: 59 | Admitting: Internal Medicine

## 2017-07-16 VITALS — BP 138/88 | HR 87 | Temp 98.1°F | Resp 16 | Ht 64.0 in | Wt 159.2 lb

## 2017-07-16 DIAGNOSIS — E7800 Pure hypercholesterolemia, unspecified: Secondary | ICD-10-CM | POA: Diagnosis not present

## 2017-07-16 DIAGNOSIS — D473 Essential (hemorrhagic) thrombocythemia: Secondary | ICD-10-CM | POA: Diagnosis not present

## 2017-07-16 DIAGNOSIS — R928 Other abnormal and inconclusive findings on diagnostic imaging of breast: Secondary | ICD-10-CM

## 2017-07-16 DIAGNOSIS — Z9109 Other allergy status, other than to drugs and biological substances: Secondary | ICD-10-CM | POA: Insufficient documentation

## 2017-07-16 DIAGNOSIS — Z124 Encounter for screening for malignant neoplasm of cervix: Secondary | ICD-10-CM | POA: Diagnosis not present

## 2017-07-16 DIAGNOSIS — M25561 Pain in right knee: Secondary | ICD-10-CM

## 2017-07-16 DIAGNOSIS — R748 Abnormal levels of other serum enzymes: Secondary | ICD-10-CM

## 2017-07-16 DIAGNOSIS — Z0000 Encounter for general adult medical examination without abnormal findings: Secondary | ICD-10-CM

## 2017-07-16 DIAGNOSIS — I10 Essential (primary) hypertension: Secondary | ICD-10-CM | POA: Diagnosis not present

## 2017-07-16 DIAGNOSIS — D75839 Thrombocytosis, unspecified: Secondary | ICD-10-CM

## 2017-07-16 NOTE — Assessment & Plan Note (Addendum)
Physical today 07/16/17.  PAP 07/16/17.  Mammogram 03/30/17 - Birads 0.  F/u right breast mammogram and ultrasound - Birads III.  Saw Dr Bary Castilla.  Recommended f/u in 6 months.  Scheduled.  Colonoscopy 05/2015 normal.  Recommended f/u in 10 years.

## 2017-07-16 NOTE — Progress Notes (Signed)
Patient ID: ASA FATH, female   DOB: 03/11/1960, 57 y.o.   MRN: [id number]   Subjective:    Patient ID: Rhonda Sanders, female    DOB: 05-03-1960, 57 y.o.   MRN: [id number]  HPI  Patient here for her physical exam.  States she is doing relatively well.  Stays active.  Is exercising.  No chest pain.  No sob.  No acid reflux  No abdominal pain.  Bowels moving.  No vaginal problems.  Is having pain in her right knee.  Remote injury.  Flares at times.  Appears to be worsening.  Hurts more if stands for a long period of time.  Has been seeing Claudette Head.  Was previously started on phentermine.  Off now.  Blood pressure elevated on initial check today.  Discussed remaining off.  Discussed diet and exercise.  Discussed last mammogram and visit with Dr Bary Castilla.  States she would like for me to schedule her f/u right breast mammogram and ultrasound and then will f/u with Dr Bary Castilla regarding results.  Discussed recent lab results and persistent elevated platelet count.  Since increasing, discussed referral to hematology.     Past Medical History:  Diagnosis Date  . Atypical lobular hyperplasia of right breast 12/25/2008   Sngle focus noted in area of columnar cell hyperplasia  . Hypercholesterolemia   . Hypertension   . PONV (postoperative nausea and vomiting)    Past Surgical History:  Procedure Laterality Date  . ABDOMINAL HYSTERECTOMY  04/2006   partial  . BREAST BIOPSY Right 2014   CORE W/CLIP - NEG  . BREAST BIOPSY Right 2015   CORE W/OUT CLIP - NEG COLUMNAR CELL CHANGE WITH MICRO CALCIFICATIONS.   Marland Kitchen BREAST EXCISIONAL BIOPSY Right 2010   EXCISIONAL -ALH, columnar cell hyperplasia  . COLONOSCOPY WITH PROPOFOL N/A 05/26/2015   Procedure: COLONOSCOPY WITH PROPOFOL;  Surgeon: Robert Bellow, MD;  Location: [facility] ENDOSCOPY;  Service: Endoscopy;  Laterality: N/A;   Family History  Problem Relation Age of Onset  . Breast cancer Mother 71  . Heart disease Father        first MI (30s),  died age 27 - [place]  . Heart disease Paternal Grandfather   . Heart disease Paternal Uncle   . CVA Paternal Grandmother   . Breast cancer Maternal Aunt 49  . Crohn's disease Brother   . Colon cancer Neg Hx    Social History   Socioeconomic History  . Marital status: Single    Spouse name: Not on file  . Number of children: Not on file  . Years of education: Not on file  . Highest education level: Not on file  Occupational History  . Not on file  Social Needs  . Financial resource strain: Not on file  . Food insecurity:    Worry: Not on file    Inability: Not on file  . Transportation needs:    Medical: Not on file    Non-medical: Not on file  Tobacco Use  . Smoking status: Never Smoker  . Smokeless tobacco: Never Used  Substance and Sexual Activity  . Alcohol use: Yes    Alcohol/week: 0.0 oz    Comment: occasional  . Drug use: No  . Sexual activity: Not on file  Lifestyle  . Physical activity:    Days per week: Not on file    Minutes per session: Not on file  . Stress: Not on file  Relationships  . Social connections:  Talks on phone: Not on file    Gets together: Not on file    Attends religious service: Not on file    Active member of club or organization: Not on file    Attends meetings of clubs or organizations: Not on file    Relationship status: Not on file  Other Topics Concern  . Not on file  Social History Narrative  . Not on file    Outpatient Encounter Medications as of 07/16/2017  Medication Sig  . cetirizine (ZYRTEC) 10 MG tablet Take 10 mg by mouth daily.  . clarithromycin (BIAXIN) 500 MG tablet Take 1 tablet (500 mg total) by mouth 2 (two) times daily.  . hydrochlorothiazide (HYDRODIURIL) 25 MG tablet TAKE ONE TABLET BY MOUTH ONCE DAILY  . Magnesium 500 MG TABS Take by mouth daily.  . montelukast (SINGULAIR) 10 MG tablet Take 1 tablet (10 mg total) by mouth at bedtime.  . Multiple Vitamin (MULTIVITAMIN) tablet Take 1 tablet by mouth daily.  .  rosuvastatin (CRESTOR) 5 MG tablet TAKE 1 TABLET BY MOUTH DAILY  . [DISCONTINUED] lisinopril (PRINIVIL,ZESTRIL) 30 MG tablet Take 1 tablet (30 mg total) by mouth daily.  . [DISCONTINUED] phentermine (ADIPEX-P) 37.5 MG tablet Take 1 tablet (37.5 mg total) by mouth daily before breakfast.   No facility-administered encounter medications on file as of 07/16/2017.     Review of Systems  Constitutional: Negative for appetite change and unexpected weight change.  HENT: Negative for congestion and sinus pressure.   Eyes: Negative for pain and visual disturbance.  Respiratory: Negative for cough, chest tightness and shortness of breath.   Cardiovascular: Negative for chest pain, palpitations and leg swelling.  Gastrointestinal: Negative for abdominal pain, diarrhea, nausea and vomiting.  Genitourinary: Negative for difficulty urinating and dysuria.  Musculoskeletal: Negative for myalgias.       Right knee pain as outlined.    Skin: Negative for color change and rash.  Neurological: Negative for dizziness and headaches.  Hematological: Negative for adenopathy. Does not bruise/bleed easily.  Psychiatric/Behavioral: Negative for agitation and dysphoric mood.       Objective:    Physical Exam  Constitutional: She is oriented to person, place, and time. She appears well-developed and well-nourished. No distress.  HENT:  Nose: Nose normal.  Mouth/Throat: Oropharynx is clear and moist.  Eyes: Right eye exhibits no discharge. Left eye exhibits no discharge. No scleral icterus.  Neck: Neck supple. No thyromegaly present.  Cardiovascular: Normal rate and regular rhythm.  Pulmonary/Chest: Breath sounds normal. No accessory muscle usage. No tachypnea. No respiratory distress. She has no decreased breath sounds. She has no wheezes. She has no rhonchi. Right breast exhibits no inverted nipple, no mass, no nipple discharge and no tenderness (no axillary adenopathy). Left breast exhibits no inverted nipple,  no mass, no nipple discharge and no tenderness (no axilarry adenopathy).  Abdominal: Soft. Bowel sounds are normal. There is no tenderness.  Genitourinary:  Genitourinary Comments: Normal external genitalia.  Vaginal vault without lesions.  Cervix identified.  Pap smear performed.  Could not appreciate any adnexal masses or tenderness.    Musculoskeletal: She exhibits no edema or tenderness.  Lymphadenopathy:    She has no cervical adenopathy.  Neurological: She is alert and oriented to person, place, and time.  Skin: No rash noted. No erythema.  Psychiatric: She has a normal mood and affect. Her behavior is normal.    BP 138/88   Pulse 87   Temp 98.1 F (36.7 C) (Oral)  Resp 16   Ht 5\' 4"  (1.626 m)   Wt 159 lb 3.2 oz (72.2 kg)   SpO2 98%   BMI 27.33 kg/m  Wt Readings from Last 3 Encounters:  07/16/17 159 lb 3.2 oz (72.2 kg)  06/08/17 159 lb 3.2 oz (72.2 kg)  05/01/17 160 lb (72.6 kg)     Lab Results  Component Value Date   WBC 9.1 07/13/2017   HGB 14.8 07/13/2017   HCT 46.5 07/13/2017   PLT 517 (H) 07/13/2017   GLUCOSE 83 07/13/2017   CHOL 197 07/13/2017   TRIG 170 (H) 07/13/2017   HDL 53 07/13/2017   LDLDIRECT 176.0 09/24/2014   LDLCALC 110 (H) 07/13/2017   ALT 33 (H) 07/13/2017   AST 33 07/13/2017   NA 141 07/13/2017   K 4.9 07/13/2017   CL 98 07/13/2017   CREATININE 1.00 07/13/2017   BUN 22 07/13/2017   CO2 24 07/13/2017   TSH 1.680 07/13/2017    US Breast Ltd Uni Right Inc Axilla  Result Date: 04/06/2017 CLINICAL DATA:  Patient recalled from screening for right breast mass. EXAM: 2D DIGITAL DIAGNOSTIC RIGHT MAMMOGRAM WITH CAD AND ADJUNCT TOMO ULTRASOUND RIGHT BREAST COMPARISON:  Previous exam(s). ACR Breast Density Category c: The breast tissue is heterogeneously dense, which may obscure small masses. FINDINGS: Within the lateral right breast posterior depth there is a small low-density circumscribed mass, further evaluated with spot compression and  tomosynthesis images. Mammographic images were processed with CAD. On physical exam, I palpate no discrete mass lateral right breast. Targeted ultrasound is performed, showing a 6 x 6 x 3 mm oval circumscribed hypoechoic mass right breast 8:00 position 6 cm from nipple, favored represent a complicated cyst. IMPRESSION: Probably benign right breast mass, favored to represent a complicated cyst. RECOMMENDATION: Right breast diagnostic ultrasound in 6 months to ensure stability of probably benign right breast mass 8:00 position. I have discussed the findings and recommendations with the patient. Results were also provided in writing at the conclusion of the visit. If applicable, a reminder letter will be sent to the patient regarding the next appointment. BI-RADS CATEGORY  3: Probably benign. Electronically Signed   By: Lovey Newcomer M.D.   On: 04/06/2017 08:35   Mm Diag Breast Tomo Uni Right  Result Date: 04/06/2017 CLINICAL DATA:  Patient recalled from screening for right breast mass. EXAM: 2D DIGITAL DIAGNOSTIC RIGHT MAMMOGRAM WITH CAD AND ADJUNCT TOMO ULTRASOUND RIGHT BREAST COMPARISON:  Previous exam(s). ACR Breast Density Category c: The breast tissue is heterogeneously dense, which may obscure small masses. FINDINGS: Within the lateral right breast posterior depth there is a small low-density circumscribed mass, further evaluated with spot compression and tomosynthesis images. Mammographic images were processed with CAD. On physical exam, I palpate no discrete mass lateral right breast. Targeted ultrasound is performed, showing a 6 x 6 x 3 mm oval circumscribed hypoechoic mass right breast 8:00 position 6 cm from nipple, favored represent a complicated cyst. IMPRESSION: Probably benign right breast mass, favored to represent a complicated cyst. RECOMMENDATION: Right breast diagnostic ultrasound in 6 months to ensure stability of probably benign right breast mass 8:00 position. I have discussed the findings and  recommendations with the patient. Results were also provided in writing at the conclusion of the visit. If applicable, a reminder letter will be sent to the patient regarding the next appointment. BI-RADS CATEGORY  3: Probably benign. Electronically Signed   By: Lovey Newcomer M.D.   On: 04/06/2017 08:35  Assessment & Plan:   Problem List Items Addressed This Visit    Abnormal liver enzymes    One liver test slightly increased.  Remainder of liver panel wnl.  Previous abdominal ultrasound ok.  Recheck liver panel in the next several weeks to confirm stable/normal.       Relevant Orders   Hepatic function panel   Environmental allergies    Controlled.       Essential hypertension    Blood pressure as outlined.  Discussed with her today.  Remain off phentermine.  Have her spot check her pressure.  Get her back in soon to reassess.        Health care maintenance    Physical today 07/16/17.  PAP 07/16/17.  Mammogram 03/30/17 - Birads 0.  F/u right breast mammogram and ultrasound - Birads III.  Saw Dr Bary Castilla.  Recommended f/u in 6 months.  Scheduled.  Colonoscopy 05/2015 normal.  Recommended f/u in 10 years.        Hypercholesterolemia    On crestor.  Low cholesterol diet and exercise.  Follow lipid panel and liver function tests.        Thrombocytosis (HCC)    Persistent elevated platelet count.  Increasing.  Discussed with her today.  Refer to hematology.        Relevant Orders   Ambulatory referral to Hematology    Other Visit Diagnoses    Routine general medical examination at a health care facility    -  Primary   Screening for cervical cancer       Relevant Orders   Cytology - PAP   Follow-up examination of abnormal mammogram       Relevant Orders   US BREAST LTD UNI RIGHT INC AXILLA   MM DIAG BREAST TOMO UNI RIGHT   Right knee pain, unspecified chronicity       Persistent intermittent right knee pain.  Xray.  Further w/up pending results.     Relevant Orders   DG Knee  1-2 Views Right (Completed)   Hypercalcemia       Calcium slightly elevated on recent labs.  Recheck.    Relevant Orders   Calcium       Einar Pheasant, MD

## 2017-07-16 NOTE — Patient Instructions (Signed)
Go by [employer] for non fasting labs in 4 weeks.

## 2017-07-17 ENCOUNTER — Encounter: Payer: Self-pay | Admitting: Internal Medicine

## 2017-07-17 DIAGNOSIS — D75839 Thrombocytosis, unspecified: Secondary | ICD-10-CM | POA: Insufficient documentation

## 2017-07-17 DIAGNOSIS — D473 Essential (hemorrhagic) thrombocythemia: Secondary | ICD-10-CM | POA: Insufficient documentation

## 2017-07-17 NOTE — Assessment & Plan Note (Signed)
Persistent elevated platelet count.  Increasing.  Discussed with her today.  Refer to hematology.

## 2017-07-17 NOTE — Assessment & Plan Note (Signed)
Blood pressure as outlined.  Discussed with her today.  Remain off phentermine.  Have her spot check her pressure.  Get her back in soon to reassess.

## 2017-07-17 NOTE — Assessment & Plan Note (Signed)
On crestor.  Low cholesterol diet and exercise.  Follow lipid panel and liver function tests.   

## 2017-07-17 NOTE — Assessment & Plan Note (Signed)
One liver test slightly increased.  Remainder of liver panel wnl.  Previous abdominal ultrasound ok.  Recheck liver panel in the next several weeks to confirm stable/normal.

## 2017-07-17 NOTE — Assessment & Plan Note (Signed)
Controlled.  

## 2017-07-18 LAB — CYTOLOGY - PAP
Diagnosis: NEGATIVE
HPV: NOT DETECTED

## 2017-07-19 ENCOUNTER — Encounter: Payer: Self-pay | Admitting: Internal Medicine

## 2017-07-19 ENCOUNTER — Ambulatory Visit: Payer: Self-pay | Admitting: Nurse Practitioner

## 2017-07-26 ENCOUNTER — Inpatient Hospital Stay: Payer: 59 | Attending: Oncology | Admitting: Oncology

## 2017-07-26 ENCOUNTER — Encounter: Payer: Self-pay | Admitting: Oncology

## 2017-07-26 ENCOUNTER — Inpatient Hospital Stay: Payer: 59

## 2017-07-26 ENCOUNTER — Other Ambulatory Visit: Payer: Self-pay

## 2017-07-26 VITALS — BP 130/79 | HR 101 | Temp 98.2°F | Ht 64.0 in | Wt 158.3 lb

## 2017-07-26 DIAGNOSIS — D473 Essential (hemorrhagic) thrombocythemia: Secondary | ICD-10-CM | POA: Insufficient documentation

## 2017-07-26 DIAGNOSIS — Z808 Family history of malignant neoplasm of other organs or systems: Secondary | ICD-10-CM

## 2017-07-26 DIAGNOSIS — I10 Essential (primary) hypertension: Secondary | ICD-10-CM

## 2017-07-26 DIAGNOSIS — Z800 Family history of malignant neoplasm of digestive organs: Secondary | ICD-10-CM | POA: Diagnosis not present

## 2017-07-26 DIAGNOSIS — D75839 Thrombocytosis, unspecified: Secondary | ICD-10-CM

## 2017-07-26 DIAGNOSIS — Z803 Family history of malignant neoplasm of breast: Secondary | ICD-10-CM | POA: Diagnosis not present

## 2017-07-26 LAB — CBC WITH DIFFERENTIAL/PLATELET
BASOS PCT: 1 %
Basophils Absolute: 0.1 10*3/uL (ref 0–0.1)
EOS ABS: 0 10*3/uL (ref 0–0.7)
Eosinophils Relative: 1 %
HCT: 45.1 % (ref 35.0–47.0)
Hemoglobin: 15.4 g/dL (ref 12.0–16.0)
LYMPHS ABS: 2.8 10*3/uL (ref 1.0–3.6)
Lymphocytes Relative: 34 %
MCH: 30.4 pg (ref 26.0–34.0)
MCHC: 34.2 g/dL (ref 32.0–36.0)
MCV: 88.9 fL (ref 80.0–100.0)
MONO ABS: 0.4 10*3/uL (ref 0.2–0.9)
MONOS PCT: 5 %
Neutro Abs: 5 10*3/uL (ref 1.4–6.5)
Neutrophils Relative %: 59 %
Platelets: 438 10*3/uL (ref 150–440)
RBC: 5.07 MIL/uL (ref 3.80–5.20)
RDW: 13.1 % (ref 11.5–14.5)
WBC: 8.3 10*3/uL (ref 3.6–11.0)

## 2017-07-26 LAB — IRON AND TIBC
Iron: 85 ug/dL (ref 28–170)
SATURATION RATIOS: 21 % (ref 10.4–31.8)
TIBC: 399 ug/dL (ref 250–450)
UIBC: 314 ug/dL

## 2017-07-26 LAB — LACTATE DEHYDROGENASE: LDH: 164 U/L (ref 98–192)

## 2017-07-26 LAB — TECHNOLOGIST SMEAR REVIEW

## 2017-07-26 LAB — FERRITIN: FERRITIN: 32 ng/mL (ref 11–307)

## 2017-07-26 NOTE — Progress Notes (Signed)
Hematology/Oncology Consult note [facility] Telephone:([phone] Fax:([phone]   Patient Care Team: Einar Pheasant, MD as PCP - General (Internal Medicine) Bary Castilla, Forest Gleason, MD (General Surgery)  REFERRING PROVIDER: Einar Pheasant, MD CHIEF COMPLAINTS/PURPOSE OF CONSULTATION:  Evaluation of thrombocytosis  HISTORY OF PRESENTING ILLNESS:  Rhonda Sanders is a  57 y.o.  female with PMH listed below who was referred to me for evaluation of thrombocytosis.  Patient recently had lab work done which revealed thrombocytopenia, platelet counts 517,000.   Reviewed patient's previous labs, his platelet count has been elevated since 2016, ranging from 364,000 to 437,000.  Patient denies fatigue, weight loss, easy bruising, hematochezia, hemoptysis She denies fatigue, weight loss, easy bruising, hematochezia, hemoptysis.  Smoking: never smoking , occasional second hand smoking.  History of DVT: Denies Family history of cancer: significant for breast cancer in mother and aunt, also family history of stomach and brain cancer. She reports that she had genetic testing done by Dr.Choski and was negative.   Last colonoscopy 2017.   Review of Systems  Constitutional: Negative for chills, fever, malaise/fatigue and weight loss.  HENT: Negative for congestion, ear discharge, ear pain, nosebleeds, sinus pain and sore throat.   Eyes: Negative for double vision, photophobia, pain, discharge and redness.  Respiratory: Negative for cough, hemoptysis, sputum production, shortness of breath and wheezing.   Cardiovascular: Negative for chest pain, palpitations, orthopnea, claudication and leg swelling.  Gastrointestinal: Negative for abdominal pain, blood in stool, constipation, diarrhea, heartburn, melena, nausea and vomiting.  Genitourinary: Negative for dysuria, flank pain, frequency and hematuria.  Musculoskeletal: Negative for back pain, myalgias and neck pain.  Skin:  Negative for itching and rash.  Neurological: Negative for dizziness, tingling, tremors, focal weakness, weakness and headaches.  Endo/Heme/Allergies: Negative for environmental allergies. Does not bruise/bleed easily.  Psychiatric/Behavioral: Negative for depression and hallucinations. The patient is not nervous/anxious.     MEDICAL HISTORY:  Past Medical History:  Diagnosis Date  . Allergy    seasonal   . Atypical lobular hyperplasia of right breast 12/25/2008   Sngle focus noted in area of columnar cell hyperplasia  . Hypercholesterolemia   . Hypertension   . PONV (postoperative nausea and vomiting)     SURGICAL HISTORY: Past Surgical History:  Procedure Laterality Date  . ABDOMINAL HYSTERECTOMY  04/2006   partial  . BREAST BIOPSY Right 2014   CORE W/CLIP - NEG  . BREAST BIOPSY Right 2015   CORE W/OUT CLIP - NEG COLUMNAR CELL CHANGE WITH MICRO CALCIFICATIONS.   Marland Kitchen BREAST EXCISIONAL BIOPSY Right 2010   EXCISIONAL -ALH, columnar cell hyperplasia  . COLONOSCOPY WITH PROPOFOL N/A 05/26/2015   Procedure: COLONOSCOPY WITH PROPOFOL;  Surgeon: Robert Bellow, MD;  Location: [facility] ENDOSCOPY;  Service: Endoscopy;  Laterality: N/A;    SOCIAL HISTORY: Social History   Socioeconomic History  . Marital status: Single    Spouse name: Not on file  . Number of children: 0  . Years of education: Not on file  . Highest education level: Not on file  Occupational History  . Not on file  Social Needs  . Financial resource strain: Not on file  . Food insecurity:    Worry: Not on file    Inability: Not on file  . Transportation needs:    Medical: Not on file    Non-medical: Not on file  Tobacco Use  . Smoking status: Never Smoker  . Smokeless tobacco: Never Used  Substance and Sexual Activity  . Alcohol  use: Yes    Alcohol/week: 0.0 oz    Comment: occasional - once a month  . Drug use: No  . Sexual activity: Not on file  Lifestyle  . Physical activity:    Days per week: Not  on file    Minutes per session: Not on file  . Stress: Not on file  Relationships  . Social connections:    Talks on phone: Not on file    Gets together: Not on file    Attends religious service: Not on file    Active member of club or organization: Not on file    Attends meetings of clubs or organizations: Not on file    Relationship status: Not on file  . Intimate partner violence:    Fear of current or ex partner: Not on file    Emotionally abused: Not on file    Physically abused: Not on file    Forced sexual activity: Not on file  Other Topics Concern  . Not on file  Social History Narrative  . Not on file    FAMILY HISTORY: Family History  Problem Relation Age of Onset  . Breast cancer Mother 39  . Heart disease Father        first MI (28s), died age 79 - [place]  . Heart disease Paternal Grandfather   . Heart disease Paternal Uncle   . CVA Paternal Grandmother   . Breast cancer Maternal Aunt 84  . Crohn's disease Brother   . Stomach cancer Maternal Grandfather   . Colon cancer Neg Hx     ALLERGIES:  has No Known Allergies.  MEDICATIONS:  Current Outpatient Medications  Medication Sig Dispense Refill  . cetirizine (ZYRTEC) 10 MG tablet Take 10 mg by mouth daily.    . clarithromycin (BIAXIN) 500 MG tablet Take 1 tablet (500 mg total) by mouth 2 (two) times daily. 20 tablet 0  . hydrochlorothiazide (HYDRODIURIL) 25 MG tablet TAKE ONE TABLET BY MOUTH ONCE DAILY 90 tablet 1  . lisinopril (PRINIVIL,ZESTRIL) 30 MG tablet TAKE 1 TABLET BY MOUTH DAILY 90 tablet 1  . Magnesium 500 MG TABS Take by mouth daily.    . montelukast (SINGULAIR) 10 MG tablet Take 1 tablet (10 mg total) by mouth at bedtime. 90 tablet 3  . Multiple Vitamin (MULTIVITAMIN) tablet Take 1 tablet by mouth daily.    . rosuvastatin (CRESTOR) 5 MG tablet TAKE 1 TABLET BY MOUTH DAILY 30 tablet 3   No current facility-administered medications for this visit.      PHYSICAL EXAMINATION: ECOG PERFORMANCE  STATUS: 0 - Asymptomatic Vitals:   07/26/17 1005  BP: 130/79  Pulse: (!) 101  Temp: 98.2 F (36.8 C)   Filed Weights   07/26/17 1005  Weight: 158 lb 4.6 oz (71.8 kg)    Physical Exam  Constitutional: She is oriented to person, place, and time. She appears well-developed and well-nourished. No distress.  HENT:  Head: Normocephalic and atraumatic.  Right Ear: External ear normal.  Left Ear: External ear normal.  Mouth/Throat: Oropharynx is clear and moist.  Eyes: Pupils are equal, round, and reactive to light. Conjunctivae and EOM are normal. No scleral icterus.  Neck: Normal range of motion. Neck supple.  Cardiovascular: Normal rate, regular rhythm and normal heart sounds.  Pulmonary/Chest: Effort normal and breath sounds normal. No respiratory distress. She has no wheezes. She has no rales. She exhibits no tenderness.  Abdominal: Soft. Bowel sounds are normal. She exhibits no distension and no mass.  There is no tenderness.  Musculoskeletal: Normal range of motion. She exhibits no edema or deformity.  Lymphadenopathy:    She has no cervical adenopathy.  Neurological: She is alert and oriented to person, place, and time. No cranial nerve deficit. Coordination normal.  Skin: Skin is warm and dry. No rash noted.  Psychiatric: She has a normal mood and affect. Her behavior is normal. Thought content normal.     LABORATORY DATA:  I have reviewed the data as listed Lab Results  Component Value Date   WBC 9.1 07/13/2017   HGB 14.8 07/13/2017   HCT 46.5 07/13/2017   MCV 94 07/13/2017   PLT 517 (H) 07/13/2017   Recent Labs    07/13/17 0820  NA 141  K 4.9  CL 98  CO2 24  GLUCOSE 83  BUN 22  CREATININE 1.00  CALCIUM 10.3*  GFRNONAA 64  GFRAA 74  PROT 7.4  ALBUMIN 4.8  AST 33  ALT 33*  ALKPHOS 90  BILITOT 0.5  BILIDIR 0.14       ASSESSMENT & PLAN:  1. Thrombocytosis ([place])   2. Hypercalcemia    # Thrombocytosis:  I discussed with patient that the  differential diagnosis of the thrombosis is broad, including benign etiology such as reactive to surgery, trauma, infection, nutrition deficiency, etc, as well as malignant etiology including underlying bone marrow disorders.   For the work up of patient's thrombocytosis, I recommend checking CBC;CMP, LDH, pathology smear review, JAK 2 mutation,  BCR ABL; MPL; CALR mutation, iron, TIBC and ferritin.  Also discussed possible bone marrow biopsy if above workup is inconclusive; however I would prefer not to do a bone marrow unless absolutely needed.  # Hypercalcemia: 10.6, check PTH and repeat calcium. HCTZ can also causes hypercalcemia. Discussed with patient.  All questions were answered. The patient knows to call the clinic with any problems questions or concerns.  Return of visit: 3 weeks. Thank you for this kind referral and the opportunity to participate in the care of this patient. A copy of today's note is routed to referring provider  Total face to face encounter time for this patient visit was 60 min. >50% of the time was  spent in counseling and coordination of care.    Earlie Server, MD, PhD Hematology Oncology [facility] at [facility] Pager- [phone] 07/26/2017

## 2017-07-26 NOTE — Progress Notes (Signed)
Patient here today as a new patient  

## 2017-07-27 LAB — PTH, INTACT AND CALCIUM
Calcium, Total (PTH): 10.3 mg/dL — ABNORMAL HIGH (ref 8.7–10.2)
PTH: 20 pg/mL (ref 15–65)

## 2017-08-03 LAB — JAK2 V617F, W REFLEX TO CALR/E12/MPL

## 2017-08-03 LAB — CALR + JAK2 E12-15 + MPL (REFLEXED)

## 2017-08-09 ENCOUNTER — Ambulatory Visit: Payer: Self-pay | Admitting: Nurse Practitioner

## 2017-08-09 LAB — BCR-ABL1 FISH
Cells Analyzed: 200
Cells Counted: 200

## 2017-08-16 ENCOUNTER — Other Ambulatory Visit: Payer: Self-pay

## 2017-08-16 ENCOUNTER — Inpatient Hospital Stay: Payer: 59 | Attending: Oncology | Admitting: Oncology

## 2017-08-16 ENCOUNTER — Encounter: Payer: Self-pay | Admitting: Oncology

## 2017-08-16 ENCOUNTER — Ambulatory Visit: Payer: 59 | Admitting: Oncology

## 2017-08-16 VITALS — BP 138/83 | HR 78 | Temp 97.5°F | Resp 18 | Wt 159.6 lb

## 2017-08-16 DIAGNOSIS — I10 Essential (primary) hypertension: Secondary | ICD-10-CM | POA: Diagnosis not present

## 2017-08-16 DIAGNOSIS — D472 Monoclonal gammopathy: Secondary | ICD-10-CM | POA: Insufficient documentation

## 2017-08-16 DIAGNOSIS — Z800 Family history of malignant neoplasm of digestive organs: Secondary | ICD-10-CM

## 2017-08-16 DIAGNOSIS — Z803 Family history of malignant neoplasm of breast: Secondary | ICD-10-CM | POA: Insufficient documentation

## 2017-08-16 DIAGNOSIS — D473 Essential (hemorrhagic) thrombocythemia: Secondary | ICD-10-CM

## 2017-08-16 DIAGNOSIS — D75839 Thrombocytosis, unspecified: Secondary | ICD-10-CM

## 2017-08-16 NOTE — Progress Notes (Signed)
Hematology/Oncology follow-up note [facility] Telephone:(336) [phone] Fax:(336) [phone]   Patient Care Team: Einar Pheasant, MD as PCP - General (Internal Medicine) Bary Castilla, Forest Gleason, MD (General Surgery)  REFERRING PROVIDER: Einar Pheasant, MD REASON FOR VISIT Follow up for treatment of thrombocytosis and hypercalcemia.   HISTORY OF PRESENTING ILLNESS:  Rhonda Sanders is a  57 y.o.  female with PMH listed below who was referred to me for evaluation of thrombocytosis.  Patient recently had lab work done which revealed thrombocytopenia, platelet counts 517,000.   Reviewed patient's previous labs, his platelet count has been elevated since 2016, ranging from 364,000 to 437,000.  Patient denies fatigue, weight loss, easy bruising, hematochezia, hemoptysis She denies fatigue, weight loss, easy bruising, hematochezia, hemoptysis.  Smoking: never smoking , occasional second hand smoking.  History of DVT: Denies Family history of cancer: significant for breast cancer in mother and aunt, also family history of stomach and brain cancer. She reports that she had genetic testing done by Dr.Choski and was negative.   Last colonoscopy 2017.   INTERVAL HISTORY Rhonda Sanders is a 57 y.o. female who has above history reviewed by me today presents for follow up visit for management of thrombocytosis and hypercalcemia. Patient has had lab work done during last visit and present for discussing lab results   Review of Systems  Constitutional: Negative for chills, fever, malaise/fatigue and weight loss.  HENT: Negative for congestion, ear discharge, ear pain, nosebleeds, sinus pain and sore throat.   Eyes: Negative for double vision, photophobia, pain, discharge and redness.  Respiratory: Negative for cough, hemoptysis, sputum production, shortness of breath and wheezing.   Cardiovascular: Negative for chest pain, palpitations, orthopnea, claudication and leg swelling.    Gastrointestinal: Negative for abdominal pain, blood in stool, constipation, diarrhea, heartburn, melena, nausea and vomiting.  Genitourinary: Negative for dysuria, flank pain, frequency and hematuria.  Musculoskeletal: Negative for back pain, myalgias and neck pain.  Skin: Negative for itching and rash.  Neurological: Negative for dizziness, tingling, tremors, focal weakness, weakness and headaches.  Endo/Heme/Allergies: Negative for environmental allergies. Does not bruise/bleed easily.  Psychiatric/Behavioral: Negative for depression and hallucinations. The patient is not nervous/anxious.     MEDICAL HISTORY:  Past Medical History:  Diagnosis Date  . Allergy    seasonal   . Atypical lobular hyperplasia of right breast 12/25/2008   Sngle focus noted in area of columnar cell hyperplasia  . Hypercholesterolemia   . Hypertension   . PONV (postoperative nausea and vomiting)     SURGICAL HISTORY: Past Surgical History:  Procedure Laterality Date  . ABDOMINAL HYSTERECTOMY  04/2006   partial  . BREAST BIOPSY Right 2014   CORE W/CLIP - NEG  . BREAST BIOPSY Right 2015   CORE W/OUT CLIP - NEG COLUMNAR CELL CHANGE WITH MICRO CALCIFICATIONS.   Marland Kitchen BREAST EXCISIONAL BIOPSY Right 2010   EXCISIONAL -ALH, columnar cell hyperplasia  . COLONOSCOPY WITH PROPOFOL N/A 05/26/2015   Procedure: COLONOSCOPY WITH PROPOFOL;  Surgeon: Robert Bellow, MD;  Location: [facility] ENDOSCOPY;  Service: Endoscopy;  Laterality: N/A;    SOCIAL HISTORY: Social History   Socioeconomic History  . Marital status: Single    Spouse name: Not on file  . Number of children: 0  . Years of education: Not on file  . Highest education level: Not on file  Occupational History  . Not on file  Social Needs  . Financial resource strain: Not on file  . Food insecurity:    Worry:  Not on file    Inability: Not on file  . Transportation needs:    Medical: Not on file    Non-medical: Not on file  Tobacco Use  . Smoking  status: Never Smoker  . Smokeless tobacco: Never Used  Substance and Sexual Activity  . Alcohol use: Yes    Alcohol/week: 0.0 oz    Comment: occasional - once a month  . Drug use: No  . Sexual activity: Not on file  Lifestyle  . Physical activity:    Days per week: Not on file    Minutes per session: Not on file  . Stress: Not on file  Relationships  . Social connections:    Talks on phone: Not on file    Gets together: Not on file    Attends religious service: Not on file    Active member of club or organization: Not on file    Attends meetings of clubs or organizations: Not on file    Relationship status: Not on file  . Intimate partner violence:    Fear of current or ex partner: Not on file    Emotionally abused: Not on file    Physically abused: Not on file    Forced sexual activity: Not on file  Other Topics Concern  . Not on file  Social History Narrative  . Not on file    FAMILY HISTORY: Family History  Problem Relation Age of Onset  . Breast cancer Mother 62  . Heart disease Father        first MI (60s), died age 82 - [place]  . Heart disease Paternal Grandfather   . Heart disease Paternal Uncle   . CVA Paternal Grandmother   . Breast cancer Maternal Aunt 14  . Crohn's disease Brother   . Stomach cancer Maternal Grandfather   . Colon cancer Neg Hx     ALLERGIES:  has No Known Allergies.  MEDICATIONS:  Current Outpatient Medications  Medication Sig Dispense Refill  . cetirizine (ZYRTEC) 10 MG tablet Take 10 mg by mouth daily.    . clarithromycin (BIAXIN) 500 MG tablet Take 1 tablet (500 mg total) by mouth 2 (two) times daily. 20 tablet 0  . hydrochlorothiazide (HYDRODIURIL) 25 MG tablet TAKE ONE TABLET BY MOUTH ONCE DAILY 90 tablet 1  . lisinopril (PRINIVIL,ZESTRIL) 30 MG tablet TAKE 1 TABLET BY MOUTH DAILY 90 tablet 1  . Magnesium 500 MG TABS Take by mouth daily.    . montelukast (SINGULAIR) 10 MG tablet Take 1 tablet (10 mg total) by mouth at bedtime. 90  tablet 3  . Multiple Vitamin (MULTIVITAMIN) tablet Take 1 tablet by mouth daily.    . rosuvastatin (CRESTOR) 5 MG tablet TAKE 1 TABLET BY MOUTH DAILY 30 tablet 3   No current facility-administered medications for this visit.      PHYSICAL EXAMINATION: ECOG PERFORMANCE STATUS: 0 - Asymptomatic Vitals:   08/16/17 0832  BP: 138/83  Pulse: 78  Resp: 18  Temp: (!) 97.5 F (36.4 C)  SpO2: 100%   Filed Weights   08/16/17 0832  Weight: 159 lb 9.8 oz (72.4 kg)    Physical Exam  Constitutional: She is oriented to person, place, and time. She appears well-developed and well-nourished. No distress.  HENT:  Head: Normocephalic and atraumatic.  Right Ear: External ear normal.  Left Ear: External ear normal.  Mouth/Throat: Oropharynx is clear and moist.  Eyes: Pupils are equal, round, and reactive to light. Conjunctivae and EOM are normal. No  scleral icterus.  Neck: Normal range of motion. Neck supple.  Cardiovascular: Normal rate, regular rhythm and normal heart sounds.  Pulmonary/Chest: Effort normal and breath sounds normal. No respiratory distress. She has no wheezes. She has no rales. She exhibits no tenderness.  Abdominal: Soft. Bowel sounds are normal. She exhibits no distension and no mass. There is no tenderness.  Musculoskeletal: Normal range of motion. She exhibits no edema or deformity.  Lymphadenopathy:    She has no cervical adenopathy.  Neurological: She is alert and oriented to person, place, and time. No cranial nerve deficit. Coordination normal.  Skin: Skin is warm and dry. No rash noted.  Psychiatric: She has a normal mood and affect. Her behavior is normal. Thought content normal.     LABORATORY DATA:  I have reviewed the data as listed Lab Results  Component Value Date   WBC 8.3 07/26/2017   HGB 15.4 07/26/2017   HCT 45.1 07/26/2017   MCV 88.9 07/26/2017   PLT 438 07/26/2017   Recent Labs    07/13/17 0820 07/26/17 1030  NA 141  --   K 4.9  --   CL  98  --   CO2 24  --   GLUCOSE 83  --   BUN 22  --   CREATININE 1.00  --   CALCIUM 10.3* 10.3*  GFRNONAA 64  --   GFRAA 74  --   PROT 7.4  --   ALBUMIN 4.8  --   AST 33  --   ALT 33*  --   ALKPHOS 90  --   BILITOT 0.5  --   BILIDIR 0.14  --        ASSESSMENT & PLAN:  1. Thrombocytosis ([place])   2. Hypercalcemia    # Thrombocytosis:  Repeat CBC showed normal upper end Platelet counts, negative Jak 2 V6 11F, CAL R, exon 12-15, MPL mutation. Normal iron TIBC ferritin. Discussed with patient that most likely her thrombocytosis is reactive.  We will continue to monitor and repeat a CBC 6 months..   # Hypercalcemia: 10.6, normal PTH and repeat calcium is 10.3.  I have advised patient to contact primary care physician as she has been taking HCTZ which can cause hypercalcemia.  Patient'  HCTZ dose has been adjusted by PCP.  Recommend continue follow-up with PCP for repeat level.  All questions were answered. The patient knows to call the clinic with any problems questions or concerns.  Return of visit: 6 months. Earlie Server, MD, PhD Hematology Oncology [facility] at [place] Pager- [phone] 08/16/2017

## 2017-08-16 NOTE — Progress Notes (Signed)
Patient here today for follow up. No concerns voiced.  °

## 2017-08-28 ENCOUNTER — Encounter: Payer: Self-pay | Admitting: Nurse Practitioner

## 2017-08-28 ENCOUNTER — Ambulatory Visit: Payer: 59 | Admitting: Nurse Practitioner

## 2017-08-28 VITALS — BP 135/85 | HR 93 | Resp 16 | Ht 63.5 in | Wt 161.6 lb

## 2017-08-28 DIAGNOSIS — I10 Essential (primary) hypertension: Secondary | ICD-10-CM | POA: Diagnosis not present

## 2017-08-28 DIAGNOSIS — J301 Allergic rhinitis due to pollen: Secondary | ICD-10-CM | POA: Diagnosis not present

## 2017-08-28 DIAGNOSIS — E782 Mixed hyperlipidemia: Secondary | ICD-10-CM

## 2017-08-28 DIAGNOSIS — E663 Overweight: Secondary | ICD-10-CM | POA: Diagnosis not present

## 2017-08-28 NOTE — Progress Notes (Signed)
[facility] [place], [place] [address]  Internal MEDICINE  Office Visit Note  Patient Name: Rhonda Sanders  [id number]  [id number]  Date of Service: 09/23/2017   Pt is here for routine follow up.   Chief Complaint  Patient presents with  . Hypertension  . Allergies    Continues to have some issue with allergies, sinus pressure and headache. Has been off and on for past several days. Lots of post nasal drip in the mornings. She is otherwise feeling ok with no fever or nausea.  Weight gain of 2 pounds since her last visit. She has had a break from appetite suppressant. She would like to restart the appetite suppressant. Does well with this. Is going to the gym every day, generally after work. Diet choices are good.       Current Medication: Outpatient Encounter Medications as of 08/28/2017  Medication Sig Note  . cetirizine (ZYRTEC) 10 MG tablet Take 10 mg by mouth daily.   . hydrochlorothiazide (HYDRODIURIL) 25 MG tablet TAKE ONE TABLET BY MOUTH ONCE DAILY   . lisinopril (PRINIVIL,ZESTRIL) 30 MG tablet TAKE 1 TABLET BY MOUTH DAILY   . Magnesium 500 MG TABS Take by mouth daily.   . montelukast (SINGULAIR) 10 MG tablet Take 1 tablet (10 mg total) by mouth at bedtime. 08/16/2017: Taking in the morning  . Multiple Vitamin (MULTIVITAMIN) tablet Take 1 tablet by mouth daily.   . rosuvastatin (CRESTOR) 5 MG tablet TAKE 1 TABLET BY MOUTH DAILY   . clarithromycin (BIAXIN) 500 MG tablet Take 1 tablet (500 mg total) by mouth 2 (two) times daily. (Patient not taking: Reported on 08/16/2017) 08/16/2017: Completed abt treatment   No facility-administered encounter medications on file as of 08/28/2017.     Surgical History: Past Surgical History:  Procedure Laterality Date  . ABDOMINAL HYSTERECTOMY  04/2006   partial  . BREAST BIOPSY Right 2014   CORE W/CLIP - NEG  . BREAST BIOPSY Right 2015   CORE W/OUT CLIP - NEG COLUMNAR CELL CHANGE WITH MICRO CALCIFICATIONS.   Marland Kitchen  BREAST EXCISIONAL BIOPSY Right 2010   EXCISIONAL -ALH, columnar cell hyperplasia  . COLONOSCOPY WITH PROPOFOL N/A 05/26/2015   Procedure: COLONOSCOPY WITH PROPOFOL;  Surgeon: Robert Bellow, MD;  Location: [facility] ENDOSCOPY;  Service: Endoscopy;  Laterality: N/A;    Medical History: Past Medical History:  Diagnosis Date  . Allergy    seasonal   . Atypical lobular hyperplasia of right breast 12/25/2008   Sngle focus noted in area of columnar cell hyperplasia  . Hypercholesterolemia   . Hypertension   . PONV (postoperative nausea and vomiting)     Family History: Family History  Problem Relation Age of Onset  . Breast cancer Mother 22  . Heart disease Father        first MI (56s), died age 27 - MI  . Heart disease Paternal Grandfather   . Heart disease Paternal Uncle   . CVA Paternal Grandmother   . Breast cancer Maternal Aunt 21  . Crohn's disease Brother   . Stomach cancer Maternal Grandfather   . Colon cancer Neg Hx     Social History   Socioeconomic History  . Marital status: Single    Spouse name: Not on file  . Number of children: 0  . Years of education: Not on file  . Highest education level: Not on file  Occupational History  . Not on file  Social Needs  . Financial resource strain:  Not on file  . Food insecurity:    Worry: Not on file    Inability: Not on file  . Transportation needs:    Medical: Not on file    Non-medical: Not on file  Tobacco Use  . Smoking status: Never Smoker  . Smokeless tobacco: Never Used  Substance and Sexual Activity  . Alcohol use: Yes    Alcohol/week: 0.0 oz    Comment: occasional - once a month  . Drug use: No  . Sexual activity: Not on file  Lifestyle  . Physical activity:    Days per week: Not on file    Minutes per session: Not on file  . Stress: Not on file  Relationships  . Social connections:    Talks on phone: Not on file    Gets together: Not on file    Attends religious service: Not on file    Active  member of club or organization: Not on file    Attends meetings of clubs or organizations: Not on file    Relationship status: Not on file  . Intimate partner violence:    Fear of current or ex partner: Not on file    Emotionally abused: Not on file    Physically abused: Not on file    Forced sexual activity: Not on file  Other Topics Concern  . Not on file  Social History Narrative  . Not on file      Review of Systems  Constitutional: Negative for chills, fatigue and unexpected weight change.  HENT: Positive for congestion and rhinorrhea. Negative for ear pain, postnasal drip, sinus pain, sore throat and voice change.   Eyes: Negative for itching.  Respiratory: Negative for cough and wheezing.   Cardiovascular: Negative for chest pain and palpitations.  Gastrointestinal: Negative for nausea and vomiting.  Endocrine: Negative for cold intolerance, heat intolerance, polydipsia, polyphagia and polyuria.  Genitourinary: Negative.   Musculoskeletal: Negative for arthralgias, back pain and myalgias.  Skin: Negative for rash.  Allergic/Immunologic: Positive for environmental allergies.  Neurological: Positive for headaches.  Hematological: Positive for adenopathy.  Psychiatric/Behavioral: Negative for behavioral problems and dysphoric mood. The patient is not nervous/anxious.     Today's Vitals   08/28/17 1607  BP: 135/85  Pulse: 93  Resp: 16  SpO2: 97%  Weight: 161 lb 9.6 oz (73.3 kg)  Height: 5' 3.5" (1.613 m)    Physical Exam  Constitutional: She is oriented to person, place, and time. She appears well-developed and well-nourished. No distress.  HENT:  Head: Normocephalic and atraumatic.  Nose: Nose normal.  Mouth/Throat: Oropharynx is clear and moist. No oropharyngeal exudate.  Eyes: Pupils are equal, round, and reactive to light. Conjunctivae and EOM are normal.  Neck: Normal range of motion. Neck supple. No JVD present. Carotid bruit is not present. No tracheal  deviation present. No thyromegaly present.  Cardiovascular: Normal rate, regular rhythm and normal heart sounds. Exam reveals no gallop and no friction rub.  No murmur heard. Pulmonary/Chest: Effort normal and breath sounds normal. No respiratory distress. She has no wheezes. She has no rales. She exhibits no tenderness.  Abdominal: Soft. Bowel sounds are normal. There is no tenderness.  Musculoskeletal: Normal range of motion.  Lymphadenopathy:    She has no cervical adenopathy.  Neurological: She is alert and oriented to person, place, and time. No cranial nerve deficit.  Skin: Skin is warm and dry. She is not diaphoretic.  Psychiatric: She has a normal mood and affect. Her behavior  is normal. Judgment and thought content normal.  Nursing note and vitals reviewed.  Assessment/Plan: 1. Essential hypertension Well managed. Continue bp medication as prescribed.   2. Mixed hyperlipidemia Review labs. Slightly elevated from last check. Continue crestor as prescribed. Recheck in 3 to 4 months and adjust medication as indicated.   3. Non-seasonal allergic rhinitis due to pollen Overall, well managed. Continue all allergy medication as prescribed.   4. Overweight Doing well controlling weight without appetite suppressant. Continue to follow up 1200-1500 calories per day. Participate in regular exercise program. Revaluate at next visit.   General Counseling: siyona coto understanding of the findings of todays visit and agrees with plan of treatment. I have discussed any further diagnostic evaluation that may be needed or ordered today. We also reviewed her medications today. she has been encouraged to call the office with any questions or concerns that should arise related to todays visit.    Counseling:  Hypertension Counseling:   The following hypertensive lifestyle modification were recommended and discussed:  1. Limiting alcohol intake to less than 1 oz/day of ethanol:(24 oz of  beer or 8 oz of wine or 2 oz of 100-proof whiskey). 2. Take baby ASA 81 mg daily. 3. Importance of regular aerobic exercise and losing weight. 4. Reduce dietary saturated fat and cholesterol intake for overall cardiovascular health. 5. Maintaining adequate dietary potassium, calcium, and magnesium intake. 6. Regular monitoring of the blood pressure. 7. Reduce sodium intake to less than 100 mmol/day (less than 2.3 gm of sodium or less than 6 gm of sodium choride)   This patient was seen by [place] with Dr Lavera Guise as a part of collaborative care agreement    Time spent: 44 Minutes       Dr Lavera Guise Internal medicine

## 2017-09-25 ENCOUNTER — Other Ambulatory Visit: Payer: Self-pay | Admitting: Internal Medicine

## 2017-10-05 ENCOUNTER — Other Ambulatory Visit: Payer: 59

## 2017-10-08 ENCOUNTER — Encounter: Payer: Self-pay | Admitting: Internal Medicine

## 2017-10-08 ENCOUNTER — Ambulatory Visit: Payer: 59 | Admitting: Internal Medicine

## 2017-10-08 VITALS — BP 124/86 | HR 74 | Temp 98.8°F | Resp 18 | Wt 166.4 lb

## 2017-10-08 DIAGNOSIS — E7800 Pure hypercholesterolemia, unspecified: Secondary | ICD-10-CM | POA: Diagnosis not present

## 2017-10-08 DIAGNOSIS — I10 Essential (primary) hypertension: Secondary | ICD-10-CM

## 2017-10-08 DIAGNOSIS — D473 Essential (hemorrhagic) thrombocythemia: Secondary | ICD-10-CM

## 2017-10-08 DIAGNOSIS — R748 Abnormal levels of other serum enzymes: Secondary | ICD-10-CM

## 2017-10-08 DIAGNOSIS — R232 Flushing: Secondary | ICD-10-CM

## 2017-10-08 DIAGNOSIS — D75839 Thrombocytosis, unspecified: Secondary | ICD-10-CM

## 2017-10-08 NOTE — Progress Notes (Signed)
Patient ID: Rhonda Sanders, female   DOB: 06-28-1960, 57 y.o.   MRN: [id number]   Subjective:    Patient ID: Rhonda Sanders, female    DOB: 11/22/1960, 57 y.o.   MRN: [id number]  HPI  Patient here for a scheduled follow up.  She reports she is doing relatively well.  She reports started having hot flashes recently.  Has gained some weight.  Is trying to watch her diet.  Is exercising.  Discussed diet and exercise.  Discussed treatment options for hot flashes and discussed menopause.  States she otherwise is doing relatively well.  No chest pain. No sob.  No acid reflux.  No abdominal pain.  Bowels moving.  No urine change.  States hot flashes are manageable at this time.     Past Medical History:  Diagnosis Date  . Allergy    seasonal   . Atypical lobular hyperplasia of right breast 12/25/2008   Sngle focus noted in area of columnar cell hyperplasia  . Hypercholesterolemia   . Hypertension   . PONV (postoperative nausea and vomiting)    Past Surgical History:  Procedure Laterality Date  . ABDOMINAL HYSTERECTOMY  04/2006   partial  . BREAST BIOPSY Right 2014   CORE W/CLIP - NEG  . BREAST BIOPSY Right 2015   CORE W/OUT CLIP - NEG COLUMNAR CELL CHANGE WITH MICRO CALCIFICATIONS.   Marland Kitchen BREAST EXCISIONAL BIOPSY Right 2010   EXCISIONAL -ALH, columnar cell hyperplasia  . COLONOSCOPY WITH PROPOFOL N/A 05/26/2015   Procedure: COLONOSCOPY WITH PROPOFOL;  Surgeon: Robert Bellow, MD;  Location: [facility] ENDOSCOPY;  Service: Endoscopy;  Laterality: N/A;   Family History  Problem Relation Age of Onset  . Breast cancer Mother 74  . Heart disease Father        first MI (24s), died age 47 - MI  . Heart disease Paternal Grandfather   . Heart disease Paternal Uncle   . CVA Paternal Grandmother   . Breast cancer Maternal Aunt 44  . Crohn's disease Brother   . Stomach cancer Maternal Grandfather   . Colon cancer Neg Hx    Social History   Socioeconomic History  . Marital status: Single    Spouse  name: Not on file  . Number of children: 0  . Years of education: Not on file  . Highest education level: Not on file  Occupational History  . Not on file  Social Needs  . Financial resource strain: Not on file  . Food insecurity:    Worry: Not on file    Inability: Not on file  . Transportation needs:    Medical: Not on file    Non-medical: Not on file  Tobacco Use  . Smoking status: Never Smoker  . Smokeless tobacco: Never Used  Substance and Sexual Activity  . Alcohol use: Yes    Alcohol/week: 0.0 oz    Comment: occasional - once a month  . Drug use: No  . Sexual activity: Not on file  Lifestyle  . Physical activity:    Days per week: Not on file    Minutes per session: Not on file  . Stress: Not on file  Relationships  . Social connections:    Talks on phone: Not on file    Gets together: Not on file    Attends religious service: Not on file    Active member of club or organization: Not on file    Attends meetings of clubs or organizations: Not on file  Relationship status: Not on file  Other Topics Concern  . Not on file  Social History Narrative  . Not on file    Outpatient Encounter Medications as of 10/08/2017  Medication Sig  . cetirizine (ZYRTEC) 10 MG tablet Take 10 mg by mouth daily.  . hydrochlorothiazide (HYDRODIURIL) 25 MG tablet TAKE ONE TABLET BY MOUTH ONCE DAILY  . lisinopril (PRINIVIL,ZESTRIL) 30 MG tablet TAKE 1 TABLET BY MOUTH DAILY  . Magnesium 500 MG TABS Take by mouth daily.  . montelukast (SINGULAIR) 10 MG tablet Take 1 tablet (10 mg total) by mouth at bedtime.  . Multiple Vitamin (MULTIVITAMIN) tablet Take 1 tablet by mouth daily.  . rosuvastatin (CRESTOR) 5 MG tablet TAKE 1 TABLET BY MOUTH DAILY  . [DISCONTINUED] clarithromycin (BIAXIN) 500 MG tablet Take 1 tablet (500 mg total) by mouth 2 (two) times daily. (Patient not taking: Reported on 08/16/2017)   No facility-administered encounter medications on file as of 10/08/2017.     Review  of Systems  Constitutional: Negative for appetite change.       Concerned regarding weight gain.    HENT: Negative for congestion and sinus pressure.   Respiratory: Negative for cough, chest tightness and shortness of breath.   Cardiovascular: Negative for chest pain, palpitations and leg swelling.  Gastrointestinal: Negative for abdominal pain, diarrhea, nausea and vomiting.  Endocrine:       Hot flashes as outlined.    Genitourinary: Negative for difficulty urinating and dysuria.  Musculoskeletal: Negative for joint swelling and myalgias.  Skin: Negative for color change and rash.  Neurological: Negative for dizziness, light-headedness and headaches.  Psychiatric/Behavioral: Negative for agitation and dysphoric mood.       Objective:    Physical Exam  Constitutional: She appears well-developed and well-nourished. No distress.  HENT:  Nose: Nose normal.  Mouth/Throat: Oropharynx is clear and moist.  Neck: Neck supple. No thyromegaly present.  Cardiovascular: Normal rate and regular rhythm.  Pulmonary/Chest: Breath sounds normal. No respiratory distress. She has no wheezes.  Abdominal: Soft. Bowel sounds are normal. There is no tenderness.  Musculoskeletal: She exhibits no edema or tenderness.  Lymphadenopathy:    She has no cervical adenopathy.  Skin: No rash noted. No erythema.  Psychiatric: She has a normal mood and affect. Her behavior is normal.    BP 124/86 (BP Location: Left Arm, Patient Position: Sitting, Cuff Size: Normal)   Pulse 74   Temp 98.8 F (37.1 C) (Oral)   Resp 18   Wt 166 lb 6.4 oz (75.5 kg)   SpO2 98%   BMI 29.01 kg/m  Wt Readings from Last 3 Encounters:  10/08/17 166 lb 6.4 oz (75.5 kg)  08/28/17 161 lb 9.6 oz (73.3 kg)  08/16/17 159 lb 9.8 oz (72.4 kg)     Lab Results  Component Value Date   WBC 8.3 07/26/2017   HGB 15.4 07/26/2017   HCT 45.1 07/26/2017   PLT 438 07/26/2017   GLUCOSE 83 07/13/2017   CHOL 197 07/13/2017   TRIG 170 (H)  07/13/2017   HDL 53 07/13/2017   LDLDIRECT 176.0 09/24/2014   LDLCALC 110 (H) 07/13/2017   ALT 33 (H) 07/13/2017   AST 33 07/13/2017   NA 141 07/13/2017   K 4.9 07/13/2017   CL 98 07/13/2017   CREATININE 1.00 07/13/2017   BUN 22 07/13/2017   CO2 24 07/13/2017   TSH 1.680 07/13/2017    Dg Knee 1-2 Views Right  Result Date: 07/16/2017 CLINICAL DATA:  Pain for  several months.  No recent injury. EXAM: RIGHT KNEE - 1-2 VIEW COMPARISON:  None. FINDINGS: No acute fracture or dislocation. Joint spaces are maintained for age. No joint effusion. IMPRESSION: No acute osseous abnormality. Electronically Signed   By: Abigail Miyamoto M.D.   On: 07/16/2017 09:12       Assessment & Plan:   Problem List Items Addressed This Visit    Abnormal liver enzymes - Primary    Previous abdominal ultrasound ok.  Continue diet and exercise.  Follow liver panel.        Essential hypertension    Blood pressure as outlined.  Have her spot check her pressure.  Follow.        Relevant Orders   TSH   Hot flashes    Discussed with her today.  Discussed treatment options.  She states hot flashes are manageable now.  Wants to hold on any further treatment at this time.  Follow.        Relevant Orders   FSH   Hypercholesterolemia    On crestor.  Low cholesterol diet and exercise.  Follow lipid panel and liver function tests.        Relevant Orders   Hepatic function panel   Lipid panel   Thrombocytosis (HCC)    Follow cbc.  Saw hematology.  Felt to be reactive.  Recommended f/u in 6 months.         Other Visit Diagnoses    Hypercalcemia       Relevant Orders   VITAMIN D 25 Hydroxy (Vit-D Deficiency, Fractures)   Basic metabolic panel       Einar Pheasant, MD

## 2017-10-10 ENCOUNTER — Encounter: Payer: Self-pay | Admitting: Internal Medicine

## 2017-10-10 NOTE — Assessment & Plan Note (Signed)
On crestor.  Low cholesterol diet and exercise.  Follow lipid panel and liver function tests.   

## 2017-10-10 NOTE — Assessment & Plan Note (Signed)
Blood pressure as outlined.  Have her spot check her pressure.  Follow.

## 2017-10-10 NOTE — Assessment & Plan Note (Signed)
Discussed with her today.  Discussed treatment options.  She states hot flashes are manageable now.  Wants to hold on any further treatment at this time.  Follow.

## 2017-10-10 NOTE — Assessment & Plan Note (Signed)
Follow cbc.  Saw hematology.  Felt to be reactive.  Recommended f/u in 6 months.

## 2017-10-10 NOTE — Assessment & Plan Note (Signed)
Previous abdominal ultrasound - ok.  Continue diet and exercise.  Follow liver panel.   

## 2017-10-11 ENCOUNTER — Ambulatory Visit: Payer: 59 | Admitting: General Surgery

## 2017-10-12 ENCOUNTER — Ambulatory Visit
Admission: RE | Admit: 2017-10-12 | Discharge: 2017-10-12 | Disposition: A | Discharge status: Home / Self Care | Payer: 59 | Source: Ambulatory Visit | Attending: Internal Medicine | Admitting: Internal Medicine

## 2017-10-12 DIAGNOSIS — R928 Other abnormal and inconclusive findings on diagnostic imaging of breast: Secondary | ICD-10-CM

## 2017-10-12 DIAGNOSIS — N6313 Unspecified lump in the right breast, lower outer quadrant: Secondary | ICD-10-CM | POA: Diagnosis not present

## 2017-10-12 DIAGNOSIS — R922 Inconclusive mammogram: Secondary | ICD-10-CM | POA: Diagnosis not present

## 2017-10-15 ENCOUNTER — Other Ambulatory Visit: Payer: Self-pay | Admitting: Internal Medicine

## 2017-10-15 DIAGNOSIS — Z1239 Encounter for other screening for malignant neoplasm of breast: Secondary | ICD-10-CM

## 2017-10-15 DIAGNOSIS — R928 Other abnormal and inconclusive findings on diagnostic imaging of breast: Secondary | ICD-10-CM

## 2017-10-15 NOTE — Progress Notes (Signed)
Orders placed for f/u mammogram and ultrasound.

## 2017-11-01 ENCOUNTER — Ambulatory Visit: Payer: 59 | Admitting: General Surgery

## 2017-11-27 ENCOUNTER — Ambulatory Visit: Payer: 59 | Admitting: Nurse Practitioner

## 2017-11-27 ENCOUNTER — Encounter: Payer: Self-pay | Admitting: Nurse Practitioner

## 2017-11-27 ENCOUNTER — Other Ambulatory Visit: Payer: Self-pay

## 2017-11-27 VITALS — BP 130/80 | HR 72 | Resp 16 | Ht 63.5 in | Wt 170.0 lb

## 2017-11-27 DIAGNOSIS — I10 Essential (primary) hypertension: Secondary | ICD-10-CM

## 2017-11-27 DIAGNOSIS — J309 Allergic rhinitis, unspecified: Secondary | ICD-10-CM | POA: Insufficient documentation

## 2017-11-27 DIAGNOSIS — R5383 Other fatigue: Secondary | ICD-10-CM | POA: Diagnosis not present

## 2017-11-27 DIAGNOSIS — E663 Overweight: Secondary | ICD-10-CM

## 2017-11-27 DIAGNOSIS — N959 Unspecified menopausal and perimenopausal disorder: Secondary | ICD-10-CM | POA: Insufficient documentation

## 2017-11-27 DIAGNOSIS — R7301 Impaired fasting glucose: Secondary | ICD-10-CM | POA: Insufficient documentation

## 2017-11-27 DIAGNOSIS — E559 Vitamin D deficiency, unspecified: Secondary | ICD-10-CM

## 2017-11-27 MED ORDER — CETIRIZINE HCL 10 MG PO TABS: 10.0000 mg | ORAL_TABLET | Freq: Every day | ORAL | 3 refills | Status: DC

## 2017-11-27 MED ORDER — MONTELUKAST SODIUM 10 MG PO TABS: 10.0000 mg | ORAL_TABLET | Freq: Every day | ORAL | 3 refills | Status: DC

## 2017-11-27 MED ORDER — HYDROCHLOROTHIAZIDE 25 MG PO TABS: 25.0000 mg | ORAL_TABLET | Freq: Every day | ORAL | 3 refills | Status: DC

## 2017-11-27 MED ORDER — ROSUVASTATIN CALCIUM 5 MG PO TABS: 5.0000 mg | ORAL_TABLET | Freq: Every day | ORAL | 3 refills | Status: DC

## 2017-11-27 MED ORDER — LISINOPRIL 30 MG PO TABS
30.0000 mg | ORAL_TABLET | Freq: Every day | ORAL | 3 refills | Status: DC
Start: 2017-11-27 — End: 2018-12-13

## 2017-11-27 MED ORDER — PHENTERMINE HCL 37.5 MG PO TABS
37.5000 mg | ORAL_TABLET | Freq: Every day | ORAL | 1 refills | Status: DC
Start: 2017-11-27 — End: 2018-01-14

## 2017-11-27 NOTE — Progress Notes (Signed)
[facility] [place], [place] [address]  Internal MEDICINE  Office Visit Note  Patient Name: Rhonda Sanders  [id number]  [id number]  Date of Service: 11/27/2017  Chief Complaint  Patient presents with  . Hypertension  . Hyperlipidemia    The patient is c/o feeling very fatigued. Has started to experience a few hot flashes. Not interfering with her daily activities, but still bothersome. Did see her GYN provider who has ordered some labs, but she has not had them drawn yet.  She is concerned about a 9 pound weight gain. Has been on "holiday" from appetite suppressant, as she had reached her goal weight and was trying to maintain. Would like to go back on phentermine to get back on track with her weight loss goals.       Current Medication: Outpatient Encounter Medications as of 11/27/2017  Medication Sig Note  . cetirizine (ZYRTEC) 10 MG tablet Take 1 tablet (10 mg total) by mouth daily.   . hydrochlorothiazide (HYDRODIURIL) 25 MG tablet Take 1 tablet (25 mg total) by mouth daily.   Marland Kitchen lisinopril (PRINIVIL,ZESTRIL) 30 MG tablet Take 1 tablet (30 mg total) by mouth daily.   . Magnesium 500 MG TABS Take by mouth daily.   . montelukast (SINGULAIR) 10 MG tablet Take 1 tablet (10 mg total) by mouth at bedtime.   . Multiple Vitamin (MULTIVITAMIN) tablet Take 1 tablet by mouth daily.   . rosuvastatin (CRESTOR) 5 MG tablet Take 1 tablet (5 mg total) by mouth daily.   . [DISCONTINUED] cetirizine (ZYRTEC) 10 MG tablet Take 10 mg by mouth daily.   . [DISCONTINUED] hydrochlorothiazide (HYDRODIURIL) 25 MG tablet TAKE ONE TABLET BY MOUTH ONCE DAILY   . [DISCONTINUED] lisinopril (PRINIVIL,ZESTRIL) 30 MG tablet TAKE 1 TABLET BY MOUTH DAILY   . [DISCONTINUED] montelukast (SINGULAIR) 10 MG tablet Take 1 tablet (10 mg total) by mouth at bedtime. 08/16/2017: Taking in the morning  . [DISCONTINUED] rosuvastatin (CRESTOR) 5 MG tablet TAKE 1 TABLET BY MOUTH DAILY   . phentermine  (ADIPEX-P) 37.5 MG tablet Take 1 tablet (37.5 mg total) by mouth daily before breakfast.    No facility-administered encounter medications on file as of 11/27/2017.     Surgical History: Past Surgical History:  Procedure Laterality Date  . ABDOMINAL HYSTERECTOMY  04/2006   partial  . BREAST BIOPSY Right 2014   CORE W/CLIP - NEG  . BREAST BIOPSY Right 2015   CORE W/OUT CLIP - NEG COLUMNAR CELL CHANGE WITH MICRO CALCIFICATIONS.   Marland Kitchen BREAST EXCISIONAL BIOPSY Right 2010   EXCISIONAL -ALH, columnar cell hyperplasia  . COLONOSCOPY WITH PROPOFOL N/A 05/26/2015   Procedure: COLONOSCOPY WITH PROPOFOL;  Surgeon: Robert Bellow, MD;  Location: [facility] ENDOSCOPY;  Service: Endoscopy;  Laterality: N/A;    Medical History: Past Medical History:  Diagnosis Date  . Allergy    seasonal   . Atypical lobular hyperplasia of right breast 12/25/2008   Sngle focus noted in area of columnar cell hyperplasia  . Hypercholesterolemia   . Hypertension   . PONV (postoperative nausea and vomiting)     Family History: Family History  Problem Relation Age of Onset  . Breast cancer Mother 10  . Heart disease Father        first MI (77s), died age 80 - MI  . Heart disease Paternal Grandfather   . Heart disease Paternal Uncle   . CVA Paternal Grandmother   . Breast cancer Maternal Aunt 68  . Crohn's  disease Brother   . Stomach cancer Maternal Grandfather   . Colon cancer Neg Hx     Social History   Socioeconomic History  . Marital status: Single    Spouse name: Not on file  . Number of children: 0  . Years of education: Not on file  . Highest education level: Not on file  Occupational History  . Not on file  Social Needs  . Financial resource strain: Not on file  . Food insecurity:    Worry: Not on file    Inability: Not on file  . Transportation needs:    Medical: Not on file    Non-medical: Not on file  Tobacco Use  . Smoking status: Never Smoker  . Smokeless tobacco: Never Used   Substance and Sexual Activity  . Alcohol use: Yes    Alcohol/week: 0.0 standard drinks    Comment: occasional - once a month  . Drug use: No  . Sexual activity: Not on file  Lifestyle  . Physical activity:    Days per week: Not on file    Minutes per session: Not on file  . Stress: Not on file  Relationships  . Social connections:    Talks on phone: Not on file    Gets together: Not on file    Attends religious service: Not on file    Active member of club or organization: Not on file    Attends meetings of clubs or organizations: Not on file    Relationship status: Not on file  . Intimate partner violence:    Fear of current or ex partner: Not on file    Emotionally abused: Not on file    Physically abused: Not on file    Forced sexual activity: Not on file  Other Topics Concern  . Not on file  Social History Narrative  . Not on file      Review of Systems  Constitutional: Positive for fatigue. Negative for chills and unexpected weight change.       Weight gain of 9 pounds since most recent visit.   HENT: Negative for congestion, ear pain, postnasal drip, rhinorrhea, sinus pain, sore throat and voice change.   Eyes: Negative.  Negative for itching.  Respiratory: Negative for cough and wheezing.   Cardiovascular: Negative for chest pain and palpitations.  Gastrointestinal: Negative for constipation, diarrhea, nausea and vomiting.  Endocrine: Positive for heat intolerance. Negative for cold intolerance, polydipsia, polyphagia and polyuria.  Genitourinary: Negative.   Musculoskeletal: Negative for arthralgias, back pain and myalgias.  Skin: Negative for rash.  Allergic/Immunologic: Positive for environmental allergies.  Neurological: Positive for headaches.  Hematological: Positive for adenopathy.  Psychiatric/Behavioral: Negative for behavioral problems and dysphoric mood. The patient is not nervous/anxious.     Today's Vitals   11/27/17 0832  BP: 130/80  Pulse:  72  Resp: 16  SpO2: 97%  Weight: 170 lb (77.1 kg)  Height: 5' 3.5" (1.613 m)    Physical Exam  Constitutional: She is oriented to person, place, and time. She appears well-developed and well-nourished. No distress.  HENT:  Head: Normocephalic and atraumatic.  Nose: Nose normal.  Mouth/Throat: Oropharynx is clear and moist. No oropharyngeal exudate.  Eyes: Pupils are equal, round, and reactive to light. Conjunctivae and EOM are normal.  Neck: Normal range of motion. Neck supple. No JVD present. Carotid bruit is not present. No tracheal deviation present. No thyromegaly present.  Cardiovascular: Normal rate, regular rhythm and normal heart sounds. Exam reveals  no gallop and no friction rub.  No murmur heard. Pulmonary/Chest: Effort normal and breath sounds normal. No respiratory distress. She has no wheezes. She has no rales. She exhibits no tenderness.  Abdominal: Soft. Bowel sounds are normal. There is no tenderness.  Musculoskeletal: Normal range of motion.  Lymphadenopathy:    She has no cervical adenopathy.  Neurological: She is alert and oriented to person, place, and time. No cranial nerve deficit.  Skin: Skin is warm and dry. She is not diaphoretic.  Psychiatric: She has a normal mood and affect. Her behavior is normal. Judgment and thought content normal.  Nursing note and vitals reviewed.  Assessment/Plan: 1. Essential hypertension Well managed. Continue bp medication as prescribed  - Comprehensive metabolic panel - TSH + free T4 - Iron, TIBC and Ferritin Panel - Vitamin B12 - hydrochlorothiazide (HYDRODIURIL) 25 MG tablet; Take 1 tablet (25 mg total) by mouth daily.  Dispense: 90 tablet; Refill: 3 - lisinopril (PRINIVIL,ZESTRIL) 30 MG tablet; Take 1 tablet (30 mg total) by mouth daily.  Dispense: 90 tablet; Refill: 3  2. Unspecified menopausal and perimenopausal disorder Check reproductive hormones for further evaluation. - FSH/LH - Prolactin - Estradiol  3. Other  fatigue - CBC With Differential - Comprehensive metabolic panel - TSH + free T4  4. Chronic allergic rhinitis - cetirizine (ZYRTEC) 10 MG tablet; Take 1 tablet (10 mg total) by mouth daily.  Dispense: 90 tablet; Refill: 3 - montelukast (SINGULAIR) 10 MG tablet; Take 1 tablet (10 mg total) by mouth at bedtime.  Dispense: 90 tablet; Refill: 3 - rosuvastatin (CRESTOR) 5 MG tablet; Take 1 tablet (5 mg total) by mouth daily.  Dispense: 90 tablet; Refill: 3  5. Vitamin D deficiency - Vitamin D 1,25 dihydroxy  6. Impaired fasting glucose - Hemoglobin A1c  7. Overweight Restart phentermine 37.5mg  tablets. Limit calorie intake to 1200-1500 calories per day. Recommend she participate in routine exercise.  - phentermine (ADIPEX-P) 37.5 MG tablet; Take 1 tablet (37.5 mg total) by mouth daily before breakfast.  Dispense: 30 tablet; Refill: 1  General Counseling: andilynn delavega understanding of the findings of todays visit and agrees with plan of treatment. I have discussed any further diagnostic evaluation that may be needed or ordered today. We also reviewed her medications today. she has been encouraged to call the office with any questions or concerns that should arise related to todays visit.   There is a liability release in patients' chart. There has been a 10 minute discussion about the side effects including but not limited to elevated blood pressure, anxiety, lack of sleep and dry mouth. Pt understands and will like to start/continue on appetite suppressant at this time. There will be one month RX given at the time of visit with proper follow up. Nova diet plan with restricted calories is given to the pt. Pt understands and agrees with  plan of treatment  This patient was seen by Leretha Pol FNP Collaboration with Dr Lavera Guise as a part of collaborative care agreement  Orders Placed This Encounter  Procedures  . CBC With Differential  . Comprehensive metabolic panel  . TSH + free  T4  . Iron, TIBC and Ferritin Panel  . Vitamin B12  . Vitamin D 1,25 dihydroxy  . Hemoglobin A1c  . FSH/LH  . Prolactin  . Estradiol    Meds ordered this encounter  Medications  . phentermine (ADIPEX-P) 37.5 MG tablet    Sig: Take 1 tablet (37.5 mg total) by mouth daily  before breakfast.    Dispense:  30 tablet    Refill:  1    Order Specific Question:   Supervising Provider    Answer:   Lavera Guise [[id number]]  . cetirizine (ZYRTEC) 10 MG tablet    Sig: Take 1 tablet (10 mg total) by mouth daily.    Dispense:  90 tablet    Refill:  3    Order Specific Question:   Supervising Provider    Answer:   Lavera Guise [[id number]]  . hydrochlorothiazide (HYDRODIURIL) 25 MG tablet    Sig: Take 1 tablet (25 mg total) by mouth daily.    Dispense:  90 tablet    Refill:  3    Order Specific Question:   Supervising Provider    Answer:   Lavera Guise [[id number]]  . montelukast (SINGULAIR) 10 MG tablet    Sig: Take 1 tablet (10 mg total) by mouth at bedtime.    Dispense:  90 tablet    Refill:  3    Order Specific Question:   Supervising Provider    Answer:   Lavera Guise [[id number]]  . lisinopril (PRINIVIL,ZESTRIL) 30 MG tablet    Sig: Take 1 tablet (30 mg total) by mouth daily.    Dispense:  90 tablet    Refill:  3    Order Specific Question:   Supervising Provider    Answer:   Lavera Guise [[id number]]  . rosuvastatin (CRESTOR) 5 MG tablet    Sig: Take 1 tablet (5 mg total) by mouth daily.    Dispense:  90 tablet    Refill:  3    Order Specific Question:   Supervising Provider    Answer:   Lavera Guise [[id number]]    Time spent: 40 Minutes      Dr Lavera Guise Internal medicine

## 2017-12-15 DIAGNOSIS — Z23 Encounter for immunization: Secondary | ICD-10-CM | POA: Diagnosis not present

## 2017-12-25 ENCOUNTER — Ambulatory Visit: Payer: 59 | Admitting: Nurse Practitioner

## 2017-12-25 ENCOUNTER — Encounter: Payer: Self-pay | Admitting: Nurse Practitioner

## 2017-12-25 VITALS — BP 139/88 | HR 80 | Temp 97.7°F | Resp 16 | Ht 63.5 in | Wt 169.0 lb

## 2017-12-25 DIAGNOSIS — J301 Allergic rhinitis due to pollen: Secondary | ICD-10-CM

## 2017-12-25 DIAGNOSIS — J0140 Acute pansinusitis, unspecified: Secondary | ICD-10-CM

## 2017-12-25 MED ORDER — CEFUROXIME AXETIL 500 MG PO TABS
500.0000 mg | ORAL_TABLET | Freq: Two times a day (BID) | ORAL | 0 refills | Status: DC
Start: 2017-12-25 — End: 2018-01-21

## 2017-12-25 MED ORDER — FLUTICASONE PROPIONATE 50 MCG/ACT NA SUSP: 2.0000 | Freq: Every day | NASAL | 6 refills | Status: DC

## 2017-12-25 NOTE — Progress Notes (Signed)
[address], [place] [address]  Internal MEDICINE  Office Visit Note  Patient Name: Rhonda Sanders  [id number]  [id number]  Date of Service: 12/26/2017   Pt is here for a sick visit.  Chief Complaint  Patient presents with  . Sinusitis  . Allergies  . Headache     Sinusitis  This is a new problem. The current episode started in the past 7 days. The problem has been gradually worsening since onset. There has been no fever. The pain is mild. Associated symptoms include chills, congestion, ear pain, headaches and a hoarse voice. Pertinent negatives include no coughing or sore throat. Past treatments include acetaminophen and oral decongestants. The treatment provided mild relief.        Current Medication:  Outpatient Encounter Medications as of 12/25/2017  Medication Sig  . cetirizine (ZYRTEC) 10 MG tablet Take 1 tablet (10 mg total) by mouth daily.  . hydrochlorothiazide (HYDRODIURIL) 25 MG tablet Take 1 tablet (25 mg total) by mouth daily.  Marland Kitchen lisinopril (PRINIVIL,ZESTRIL) 30 MG tablet Take 1 tablet (30 mg total) by mouth daily.  . Magnesium 500 MG TABS Take by mouth daily.  . montelukast (SINGULAIR) 10 MG tablet Take 1 tablet (10 mg total) by mouth at bedtime.  . Multiple Vitamin (MULTIVITAMIN) tablet Take 1 tablet by mouth daily.  . phentermine (ADIPEX-P) 37.5 MG tablet Take 1 tablet (37.5 mg total) by mouth daily before breakfast.  . rosuvastatin (CRESTOR) 5 MG tablet Take 1 tablet (5 mg total) by mouth daily.  . cefUROXime (CEFTIN) 500 MG tablet Take 1 tablet (500 mg total) by mouth 2 (two) times daily with a meal.  . fluticasone (FLONASE) 50 MCG/ACT nasal spray Place 2 sprays into both nostrils daily.   No facility-administered encounter medications on file as of 12/25/2017.       Medical History: Past Medical History:  Diagnosis Date  . Allergy    seasonal   . Atypical lobular hyperplasia of right breast 12/25/2008   Sngle  focus noted in area of columnar cell hyperplasia  . Hypercholesterolemia   . Hypertension   . PONV (postoperative nausea and vomiting)      Vital Signs: BP 139/88   Pulse 80   Temp 97.7 F (36.5 C)   Resp 16   Ht 5' 3.5" (1.613 m)   Wt 169 lb (76.7 kg)   SpO2 98%   BMI 29.47 kg/m    Review of Systems  Constitutional: Positive for chills and fatigue. Negative for fever.  HENT: Positive for congestion, ear pain, hoarse voice, postnasal drip, rhinorrhea, sinus pain and voice change. Negative for sore throat.   Eyes: Negative.   Respiratory: Negative for cough and wheezing.   Cardiovascular: Negative for chest pain and palpitations.  Gastrointestinal: Negative.   Endocrine: Negative.   Musculoskeletal: Negative.   Skin: Negative.   Allergic/Immunologic: Positive for environmental allergies.  Neurological: Positive for headaches.  Hematological: Negative for adenopathy.  Psychiatric/Behavioral: Negative.     Physical Exam  Constitutional: She is oriented to person, place, and time. She appears well-developed and well-nourished. No distress.  HENT:  Head: Normocephalic and atraumatic.  Right Ear: There is tenderness. Tympanic membrane is erythematous and bulging.  Left Ear: There is tenderness. Tympanic membrane is erythematous and bulging.  Nose: Rhinorrhea present. Right sinus exhibits frontal sinus tenderness. Left sinus exhibits frontal sinus tenderness.  Mouth/Throat: Posterior oropharyngeal erythema present. No oropharyngeal exudate.  Eyes: Pupils are equal, round, and reactive to  light. EOM are normal.  Neck: Normal range of motion. Neck supple. No JVD present. No tracheal deviation present. No thyromegaly present.  Cardiovascular: Normal rate, regular rhythm and normal heart sounds. Exam reveals no gallop and no friction rub.  No murmur heard. Pulmonary/Chest: Effort normal and breath sounds normal. No respiratory distress. She has no wheezes. She has no rales. She  exhibits no tenderness.  Musculoskeletal: Normal range of motion.  Lymphadenopathy:    She has cervical adenopathy.  Neurological: She is alert and oriented to person, place, and time. No cranial nerve deficit.  Skin: Skin is warm and dry. She is not diaphoretic.  Psychiatric: She has a normal mood and affect. Her behavior is normal. Judgment and thought content normal.  Nursing note and vitals reviewed.  Assessment/Plan: 1. Acute non-recurrent pansinusitis Treat with ceftin 500mg  mg bid for 10 days. Rest and increase fluids. Use OTC medications to alleviate symptoms.  - cefUROXime (CEFTIN) 500 MG tablet; Take 1 tablet (500 mg total) by mouth 2 (two) times daily with a meal.  Dispense: 20 tablet; Refill: 0  2. Non-seasonal allergic rhinitis due to pollen Add back flonase nasal spray. Continue other allergy medications as prescribed . - fluticasone (FLONASE) 50 MCG/ACT nasal spray; Place 2 sprays into both nostrils daily.  Dispense: 16 g; Refill: 6  General Counseling: Bethannie verbalizes understanding of the findings of todays visit and agrees with plan of treatment. I have discussed any further diagnostic evaluation that may be needed or ordered today. We also reviewed her medications today. she has been encouraged to call the office with any questions or concerns that should arise related to todays visit.    Counseling:  Rest and increase fluids. Continue using OTC medication to control symptoms.   This patient was seen by [place] with Dr Lavera Guise as a part of collaborative care agreement  Meds ordered this encounter  Medications  . cefUROXime (CEFTIN) 500 MG tablet    Sig: Take 1 tablet (500 mg total) by mouth 2 (two) times daily with a meal.    Dispense:  20 tablet    Refill:  0    Order Specific Question:   Supervising Provider    Answer:   Lavera Guise [place]  . fluticasone (FLONASE) 50 MCG/ACT nasal spray    Sig: Place 2 sprays into both nostrils  daily.    Dispense:  16 g    Refill:  6    Order Specific Question:   Supervising Provider    Answer:   Lavera Guise [[id number]]    Time spent: 15 Minutes

## 2018-01-10 DIAGNOSIS — N959 Unspecified menopausal and perimenopausal disorder: Secondary | ICD-10-CM | POA: Diagnosis not present

## 2018-01-10 DIAGNOSIS — I10 Essential (primary) hypertension: Secondary | ICD-10-CM | POA: Diagnosis not present

## 2018-01-10 DIAGNOSIS — R748 Abnormal levels of other serum enzymes: Secondary | ICD-10-CM | POA: Diagnosis not present

## 2018-01-10 DIAGNOSIS — R5383 Other fatigue: Secondary | ICD-10-CM | POA: Diagnosis not present

## 2018-01-10 DIAGNOSIS — E7800 Pure hypercholesterolemia, unspecified: Secondary | ICD-10-CM | POA: Diagnosis not present

## 2018-01-11 ENCOUNTER — Ambulatory Visit: Payer: Self-pay | Admitting: Adult Health

## 2018-01-11 LAB — LIPID PANEL
CHOLESTEROL TOTAL: 189 mg/dL (ref 100–199)
Chol/HDL Ratio: 3.6 ratio (ref 0.0–4.4)
HDL: 52 mg/dL (ref >39)
LDL Calculated: 97 mg/dL (ref 0–99)
TRIGLYCERIDES: 202 mg/dL — AB (ref 0–149)
VLDL Cholesterol Cal: 40 mg/dL (ref 5–40)

## 2018-01-11 LAB — BASIC METABOLIC PANEL
BUN/Creatinine Ratio: 16 (ref 9–23)
BUN: 16 mg/dL (ref 6–24)
CALCIUM: 10.3 mg/dL — AB (ref 8.7–10.2)
CO2: 23 mmol/L (ref 20–29)
CREATININE: 1 mg/dL (ref 0.57–1.00)
Chloride: 98 mmol/L (ref 96–106)
GFR calc Af Amer: 73 mL/min/{1.73_m2} (ref >59)
GFR calc non Af Amer: 64 mL/min/{1.73_m2} (ref >59)
GLUCOSE: 99 mg/dL (ref 65–99)
Potassium: 4 mmol/L (ref 3.5–5.2)
SODIUM: 138 mmol/L (ref 134–144)

## 2018-01-11 LAB — HEPATIC FUNCTION PANEL
ALT: 30 IU/L (ref 0–32)
ALT: 31 IU/L (ref 0–32)
AST: 29 IU/L (ref 0–40)
AST: 33 IU/L (ref 0–40)
Albumin: 4.7 g/dL (ref 3.5–5.5)
Albumin: 5 g/dL (ref 3.5–5.5)
Alkaline Phosphatase: 105 IU/L (ref 39–117)
Alkaline Phosphatase: 109 IU/L (ref 39–117)
BILIRUBIN TOTAL: 0.6 mg/dL (ref 0.0–1.2)
BILIRUBIN, DIRECT: 0.14 mg/dL (ref 0.00–0.40)
BILIRUBIN, DIRECT: 0.15 mg/dL (ref 0.00–0.40)
Bilirubin Total: 0.6 mg/dL (ref 0.0–1.2)
TOTAL PROTEIN: 7.3 g/dL (ref 6.0–8.5)
TOTAL PROTEIN: 7.7 g/dL (ref 6.0–8.5)

## 2018-01-11 LAB — TSH: TSH: 1.67 u[IU]/mL (ref 0.450–4.500)

## 2018-01-11 LAB — VITAMIN D 25 HYDROXY (VIT D DEFICIENCY, FRACTURES): Vit D, 25-Hydroxy: 28.6 ng/mL — ABNORMAL LOW (ref 30.0–100.0)

## 2018-01-11 LAB — CALCIUM: CALCIUM: 10.3 mg/dL — AB (ref 8.7–10.2)

## 2018-01-11 LAB — FOLLICLE STIMULATING HORMONE: FSH: 20.1 m[IU]/mL

## 2018-01-14 ENCOUNTER — Ambulatory Visit: Payer: 59 | Admitting: Nurse Practitioner

## 2018-01-14 ENCOUNTER — Encounter: Payer: Self-pay | Admitting: Nurse Practitioner

## 2018-01-14 VITALS — BP 133/85 | HR 95 | Resp 16 | Ht 63.5 in | Wt 168.0 lb

## 2018-01-14 DIAGNOSIS — E782 Mixed hyperlipidemia: Secondary | ICD-10-CM | POA: Diagnosis not present

## 2018-01-14 DIAGNOSIS — E663 Overweight: Secondary | ICD-10-CM | POA: Diagnosis not present

## 2018-01-14 DIAGNOSIS — E559 Vitamin D deficiency, unspecified: Secondary | ICD-10-CM | POA: Diagnosis not present

## 2018-01-14 DIAGNOSIS — I10 Essential (primary) hypertension: Secondary | ICD-10-CM | POA: Diagnosis not present

## 2018-01-14 MED ORDER — PHENTERMINE HCL 37.5 MG PO TABS
37.5000 mg | ORAL_TABLET | Freq: Every day | ORAL | 1 refills | Status: DC
Start: 2018-01-14 — End: 2018-03-07

## 2018-01-14 NOTE — Progress Notes (Signed)
[facility] [place], [place] [address]  Internal MEDICINE  Office Visit Note  Patient Name: Rhonda Sanders  [id number]  [id number]  Date of Service: 01/16/2018  Chief Complaint  Patient presents with  . Medical Management of Chronic Issues    6wk follow up weight management    The patient is here for routine follow up exam. She is currently taking phentermine for weight management. She has lost 2 pounds since her last visit for weight management. She did have to break from the medication for 2 weeks, as she had developed a serious sinus infection and was unable to exercise so did not take the medication. She is feeling much better. Has started going to the gym again, working out for about an hour, three times per week. She hsa improved her diet choices.  The patient has had labs done, ordered by me, and ordered per her primary GYN provider. Her triglycerides were elevated, however, overall lipid panel was improved. Continues to circulate reproductive hormones. Calcium slightly elevated, however, this has remained stable over past few years. With mild decrease in Vitamin d, recommended OTC vitamin D3 at 2000 iu daily. Should help to reduce calcium levels.       Current Medication: Outpatient Encounter Medications as of 01/14/2018  Medication Sig  . cefUROXime (CEFTIN) 500 MG tablet Take 1 tablet (500 mg total) by mouth 2 (two) times daily with a meal.  . cetirizine (ZYRTEC) 10 MG tablet Take 1 tablet (10 mg total) by mouth daily.  . fluticasone (FLONASE) 50 MCG/ACT nasal spray Place 2 sprays into both nostrils daily.  . hydrochlorothiazide (HYDRODIURIL) 25 MG tablet Take 1 tablet (25 mg total) by mouth daily.  Marland Kitchen lisinopril (PRINIVIL,ZESTRIL) 30 MG tablet Take 1 tablet (30 mg total) by mouth daily.  . Magnesium 500 MG TABS Take by mouth daily.  . montelukast (SINGULAIR) 10 MG tablet Take 1 tablet (10 mg total) by mouth at bedtime.  . Multiple Vitamin  (MULTIVITAMIN) tablet Take 1 tablet by mouth daily.  . phentermine (ADIPEX-P) 37.5 MG tablet Take 1 tablet (37.5 mg total) by mouth daily before breakfast.  . rosuvastatin (CRESTOR) 5 MG tablet Take 1 tablet (5 mg total) by mouth daily.  . [DISCONTINUED] phentermine (ADIPEX-P) 37.5 MG tablet Take 1 tablet (37.5 mg total) by mouth daily before breakfast.   No facility-administered encounter medications on file as of 01/14/2018.     Surgical History: Past Surgical History:  Procedure Laterality Date  . ABDOMINAL HYSTERECTOMY  04/2006   partial  . BREAST BIOPSY Right 2014   CORE W/CLIP - NEG  . BREAST BIOPSY Right 2015   CORE W/OUT CLIP - NEG COLUMNAR CELL CHANGE WITH MICRO CALCIFICATIONS.   Marland Kitchen BREAST EXCISIONAL BIOPSY Right 2010   EXCISIONAL -ALH, columnar cell hyperplasia  . COLONOSCOPY WITH PROPOFOL N/A 05/26/2015   Procedure: COLONOSCOPY WITH PROPOFOL;  Surgeon: Robert Bellow, MD;  Location: [facility];  Service: Endoscopy;  Laterality: N/A;    Medical History: Past Medical History:  Diagnosis Date  . Allergy    seasonal   . Atypical lobular hyperplasia of right breast 12/25/2008   Sngle focus noted in area of columnar cell hyperplasia  . Hypercholesterolemia   . Hypertension   . PONV (postoperative nausea and vomiting)     Family History: Family History  Problem Relation Age of Onset  . Breast cancer Mother 23  . Heart disease Father        first MI (76s),  died age 66 - MI  . Heart disease Paternal Grandfather   . Heart disease Paternal Uncle   . CVA Paternal Grandmother   . Breast cancer Maternal Aunt 97  . Crohn's disease Brother   . Stomach cancer Maternal Grandfather   . Colon cancer Neg Hx     Social History   Socioeconomic History  . Marital status: Single    Spouse name: Not on file  . Number of children: 0  . Years of education: Not on file  . Highest education level: Not on file  Occupational History  . Not on file  Social Needs  .  Financial resource strain: Not on file  . Food insecurity:    Worry: Not on file    Inability: Not on file  . Transportation needs:    Medical: Not on file    Non-medical: Not on file  Tobacco Use  . Smoking status: Never Smoker  . Smokeless tobacco: Never Used  Substance and Sexual Activity  . Alcohol use: Yes    Alcohol/week: 0.0 standard drinks    Comment: occasional - once a month  . Drug use: No  . Sexual activity: Not on file  Lifestyle  . Physical activity:    Days per week: Not on file    Minutes per session: Not on file  . Stress: Not on file  Relationships  . Social connections:    Talks on phone: Not on file    Gets together: Not on file    Attends religious service: Not on file    Active member of club or organization: Not on file    Attends meetings of clubs or organizations: Not on file    Relationship status: Not on file  . Intimate partner violence:    Fear of current or ex partner: Not on file    Emotionally abused: Not on file    Physically abused: Not on file    Forced sexual activity: Not on file  Other Topics Concern  . Not on file  Social History Narrative  . Not on file      Review of Systems  Constitutional: Negative for chills, fatigue and unexpected weight change.       Two pound weight loss since last visit.   HENT: Negative for congestion, postnasal drip, rhinorrhea, sneezing and sore throat.   Respiratory: Negative for cough, chest tightness and shortness of breath.   Cardiovascular: Negative for chest pain and palpitations.  Gastrointestinal: Negative for abdominal pain, constipation, diarrhea, nausea and vomiting.  Endocrine: Negative for cold intolerance, heat intolerance, polydipsia, polyphagia and polyuria.  Musculoskeletal: Negative for arthralgias, back pain, joint swelling and neck pain.  Skin: Negative for rash.  Allergic/Immunologic: Positive for environmental allergies.  Neurological: Negative for dizziness, tremors,  numbness and headaches.  Hematological: Negative for adenopathy. Does not bruise/bleed easily.  Psychiatric/Behavioral: Negative for behavioral problems (Depression), sleep disturbance and suicidal ideas. The patient is not nervous/anxious.     Today's Vitals   01/14/18 1612  BP: 133/85  Pulse: 95  Resp: 16  SpO2: 98%  Weight: 168 lb (76.2 kg)  Height: 5' 3.5" (1.613 m)    Physical Exam  Constitutional: She is oriented to person, place, and time. She appears well-developed and well-nourished. No distress.  HENT:  Head: Normocephalic and atraumatic.  Mouth/Throat: No oropharyngeal exudate.  Eyes: Pupils are equal, round, and reactive to light. EOM are normal.  Neck: Normal range of motion. Neck supple. No JVD present. No tracheal  deviation present. No thyromegaly present.  Cardiovascular: Normal rate, regular rhythm and normal heart sounds. Exam reveals no gallop and no friction rub.  No murmur heard. Pulmonary/Chest: Effort normal and breath sounds normal. No respiratory distress. She has no wheezes. She has no rales. She exhibits no tenderness.  Musculoskeletal: Normal range of motion.  Lymphadenopathy:    She has no cervical adenopathy.  Neurological: She is alert and oriented to person, place, and time. No cranial nerve deficit.  Skin: Skin is warm and dry. She is not diaphoretic.  Psychiatric: She has a normal mood and affect. Her behavior is normal. Judgment and thought content normal.  Nursing note and vitals reviewed.  Assessment/Plan:  1. Essential hypertension Stable. Continue BP medication as prescribed   2. Mixed hyperlipidemia Discussed elevated triglycerides. Prudent diet information provided. Continue with lipid lowering medication as prescribed   3. Overweight May continue phentermine 37.5mg  daily. Limit calorie intake to 1200 calories per day and continue to incorporate regular exercise into daily routine.  - phentermine (ADIPEX-P) 37.5 MG tablet; Take 1  tablet (37.5 mg total) by mouth daily before breakfast.  Dispense: 30 tablet; Refill: 1  4. Vitamin D deficiency Recommend OTC vitamin D3 2000iu every day.   5. Hypercalcemia Mildly elevated calcium levels on recent labs. Recommendations made to increase hydration and intake of vitamin d. Recheck after next visit.   General Counseling: joyia riehle understanding of the findings of todays visit and agrees with plan of treatment. I have discussed any further diagnostic evaluation that may be needed or ordered today. We also reviewed her medications today. she has been encouraged to call the office with any questions or concerns that should arise related to todays visit.   There is a liability release in patients' chart. There has been a 10 minute discussion about the side effects including but not limited to elevated blood pressure, anxiety, lack of sleep and dry mouth. Pt understands and will like to start/continue on appetite suppressant at this time. There will be one month RX given at the time of visit with proper follow up. Nova diet plan with restricted calories is given to the pt. Pt understands and agrees with  plan of treatment  This patient was seen by Leretha Pol FNP Collaboration with Dr Lavera Guise as a part of collaborative care agreement  Meds ordered this encounter  Medications  . phentermine (ADIPEX-P) 37.5 MG tablet    Sig: Take 1 tablet (37.5 mg total) by mouth daily before breakfast.    Dispense:  30 tablet    Refill:  1    Order Specific Question:   Supervising Provider    Answer:   Lavera Guise [[id number]]    Time spent: 51 Minutes      Dr Lavera Guise Internal medicine

## 2018-01-15 LAB — COMPREHENSIVE METABOLIC PANEL
ALBUMIN: 4.6 g/dL (ref 3.5–5.5)
ALK PHOS: 106 IU/L (ref 39–117)
ALT: 28 IU/L (ref 0–32)
AST: 31 IU/L (ref 0–40)
Albumin/Globulin Ratio: 1.8 (ref 1.2–2.2)
BUN/Creatinine Ratio: 16 (ref 9–23)
BUN: 16 mg/dL (ref 6–24)
Bilirubin Total: 0.5 mg/dL (ref 0.0–1.2)
CO2: 23 mmol/L (ref 20–29)
CREATININE: 1.01 mg/dL — AB (ref 0.57–1.00)
Calcium: 10.1 mg/dL (ref 8.7–10.2)
Chloride: 98 mmol/L (ref 96–106)
GFR calc Af Amer: 72 mL/min/{1.73_m2} (ref >59)
GFR calc non Af Amer: 63 mL/min/{1.73_m2} (ref >59)
GLUCOSE: 98 mg/dL (ref 65–99)
Globulin, Total: 2.6 g/dL (ref 1.5–4.5)
Potassium: 4 mmol/L (ref 3.5–5.2)
Sodium: 135 mmol/L (ref 134–144)
Total Protein: 7.2 g/dL (ref 6.0–8.5)

## 2018-01-15 LAB — CBC WITH DIFFERENTIAL
BASOS ABS: 0.1 10*3/uL (ref 0.0–0.2)
Basos: 1 %
EOS (ABSOLUTE): 0.2 10*3/uL (ref 0.0–0.4)
Eos: 2 %
Hematocrit: 43 % (ref 34.0–46.6)
Hemoglobin: 14.8 g/dL (ref 11.1–15.9)
IMMATURE GRANULOCYTES: 0 %
Immature Grans (Abs): 0 10*3/uL (ref 0.0–0.1)
LYMPHS ABS: 3 10*3/uL (ref 0.7–3.1)
Lymphs: 34 %
MCH: 30.4 pg (ref 26.6–33.0)
MCHC: 34.4 g/dL (ref 31.5–35.7)
MCV: 88 fL (ref 79–97)
MONOCYTES: 5 %
Monocytes Absolute: 0.5 10*3/uL (ref 0.1–0.9)
NEUTROS PCT: 58 %
Neutrophils Absolute: 5.1 10*3/uL (ref 1.4–7.0)
RBC: 4.87 x10E6/uL (ref 3.77–5.28)
RDW: 12.3 % (ref 12.3–15.4)
WBC: 8.7 10*3/uL (ref 3.4–10.8)

## 2018-01-15 LAB — IRON,TIBC AND FERRITIN PANEL
Ferritin: 61 ng/mL (ref 15–150)
IRON: 104 ug/dL (ref 27–159)
Iron Saturation: 32 % (ref 15–55)
Total Iron Binding Capacity: 328 ug/dL (ref 250–450)
UIBC: 224 ug/dL (ref 131–425)

## 2018-01-15 LAB — PROLACTIN: PROLACTIN: 6.7 ng/mL (ref 4.8–23.3)

## 2018-01-15 LAB — HEMOGLOBIN A1C
ESTIMATED AVERAGE GLUCOSE: 128 mg/dL
HEMOGLOBIN A1C: 6.1 % — AB (ref 4.8–5.6)

## 2018-01-15 LAB — VITAMIN B12: Vitamin B-12: 1007 pg/mL (ref 232–1245)

## 2018-01-15 LAB — FSH/LH
FSH: 19.6 m[IU]/mL
LH: 14.1 m[IU]/mL

## 2018-01-15 LAB — ESTRADIOL: Estradiol: 107.4 pg/mL

## 2018-01-15 LAB — VITAMIN D 1,25 DIHYDROXY
VITAMIN D3 1, 25 (OH): 28 pg/mL
Vitamin D 1, 25 (OH)2 Total: 28 pg/mL
Vitamin D2 1, 25 (OH)2: <10 pg/mL

## 2018-01-15 LAB — TSH+FREE T4
FREE T4: 1.5 ng/dL (ref 0.82–1.77)
TSH: 1.5 u[IU]/mL (ref 0.450–4.500)

## 2018-01-21 ENCOUNTER — Ambulatory Visit: Payer: 59 | Admitting: Internal Medicine

## 2018-01-21 DIAGNOSIS — D473 Essential (hemorrhagic) thrombocythemia: Secondary | ICD-10-CM

## 2018-01-21 DIAGNOSIS — Z87898 Personal history of other specified conditions: Secondary | ICD-10-CM | POA: Diagnosis not present

## 2018-01-21 DIAGNOSIS — Z9109 Other allergy status, other than to drugs and biological substances: Secondary | ICD-10-CM

## 2018-01-21 DIAGNOSIS — I10 Essential (primary) hypertension: Secondary | ICD-10-CM | POA: Diagnosis not present

## 2018-01-21 DIAGNOSIS — D75839 Thrombocytosis, unspecified: Secondary | ICD-10-CM

## 2018-01-21 DIAGNOSIS — R748 Abnormal levels of other serum enzymes: Secondary | ICD-10-CM | POA: Diagnosis not present

## 2018-01-21 DIAGNOSIS — E559 Vitamin D deficiency, unspecified: Secondary | ICD-10-CM

## 2018-01-21 DIAGNOSIS — E7800 Pure hypercholesterolemia, unspecified: Secondary | ICD-10-CM

## 2018-01-21 NOTE — Patient Instructions (Signed)
Vitamin D3 1000 units per day 

## 2018-01-21 NOTE — Progress Notes (Signed)
Patient ID: Rhonda Sanders, female   DOB: 21-Jan-1961, 57 y.o.   MRN: [id number]   Subjective:    Patient ID: Rhonda Sanders, female    DOB: Mar 01, 1961, 57 y.o.   MRN: [id number]  HPI  Patient here for a scheduled follow up.  She is seeing Leretha Pol.  Taking phentermine.  States she is following her heart rate and blood pressure closely. Just had a full panel of labs.  Discussed elevated sugar, triglycerides and low carb diet.  Discussed diet and exercise.  Information given.  No chest pain.  No sob.  No acid reflux.  No abdominal pain.  Bowels moving.  Discussed last mammogram.  Need to schedule f/u mammogram.     Past Medical History:  Diagnosis Date  . Allergy    seasonal   . Atypical lobular hyperplasia of right breast 12/25/2008   Sngle focus noted in area of columnar cell hyperplasia  . Hypercholesterolemia   . Hypertension   . PONV (postoperative nausea and vomiting)    Past Surgical History:  Procedure Laterality Date  . ABDOMINAL HYSTERECTOMY  04/2006   partial  . BREAST BIOPSY Right 2014   CORE W/CLIP - NEG  . BREAST BIOPSY Right 2015   CORE W/OUT CLIP - NEG COLUMNAR CELL CHANGE WITH MICRO CALCIFICATIONS.   Marland Kitchen BREAST EXCISIONAL BIOPSY Right 2010   EXCISIONAL -ALH, columnar cell hyperplasia  . COLONOSCOPY WITH PROPOFOL N/A 05/26/2015   Procedure: COLONOSCOPY WITH PROPOFOL;  Surgeon: Robert Bellow, MD;  Location: [facility] ENDOSCOPY;  Service: Endoscopy;  Laterality: N/A;   Family History  Problem Relation Age of Onset  . Breast cancer Mother 52  . Heart disease Father        first MI (2s), died age 32 - MI  . Heart disease Paternal Grandfather   . Heart disease Paternal Uncle   . CVA Paternal Grandmother   . Breast cancer Maternal Aunt 31  . Crohn's disease Brother   . Stomach cancer Maternal Grandfather   . Colon cancer Neg Hx    Social History   Socioeconomic History  . Marital status: Single    Spouse name: Not on file  . Number of children: 0  . Years of  education: Not on file  . Highest education level: Not on file  Occupational History  . Not on file  Social Needs  . Financial resource strain: Not on file  . Food insecurity:    Worry: Not on file    Inability: Not on file  . Transportation needs:    Medical: Not on file    Non-medical: Not on file  Tobacco Use  . Smoking status: Never Smoker  . Smokeless tobacco: Never Used  Substance and Sexual Activity  . Alcohol use: Yes    Alcohol/week: 0.0 standard drinks    Comment: occasional - once a month  . Drug use: No  . Sexual activity: Not on file  Lifestyle  . Physical activity:    Days per week: Not on file    Minutes per session: Not on file  . Stress: Not on file  Relationships  . Social connections:    Talks on phone: Not on file    Gets together: Not on file    Attends religious service: Not on file    Active member of club or organization: Not on file    Attends meetings of clubs or organizations: Not on file    Relationship status: Not on file  Other  Topics Concern  . Not on file  Social History Narrative  . Not on file    Outpatient Encounter Medications as of 01/21/2018  Medication Sig  . cetirizine (ZYRTEC) 10 MG tablet Take 1 tablet (10 mg total) by mouth daily.  . fluticasone (FLONASE) 50 MCG/ACT nasal spray Place 2 sprays into both nostrils daily.  . hydrochlorothiazide (HYDRODIURIL) 25 MG tablet Take 1 tablet (25 mg total) by mouth daily.  Marland Kitchen lisinopril (PRINIVIL,ZESTRIL) 30 MG tablet Take 1 tablet (30 mg total) by mouth daily.  . Magnesium 500 MG TABS Take by mouth daily.  . montelukast (SINGULAIR) 10 MG tablet Take 1 tablet (10 mg total) by mouth at bedtime.  . Multiple Vitamin (MULTIVITAMIN) tablet Take 1 tablet by mouth daily.  . phentermine (ADIPEX-P) 37.5 MG tablet Take 1 tablet (37.5 mg total) by mouth daily before breakfast.  . rosuvastatin (CRESTOR) 5 MG tablet Take 1 tablet (5 mg total) by mouth daily.  . [DISCONTINUED] cefUROXime (CEFTIN) 500  MG tablet Take 1 tablet (500 mg total) by mouth 2 (two) times daily with a meal.   No facility-administered encounter medications on file as of 01/21/2018.     Review of Systems  Constitutional: Negative for appetite change and unexpected weight change.  HENT: Negative for congestion and sinus pressure.   Respiratory: Negative for cough, chest tightness and shortness of breath.   Cardiovascular: Negative for chest pain, palpitations and leg swelling.  Gastrointestinal: Negative for abdominal pain, diarrhea, nausea and vomiting.  Genitourinary: Negative for difficulty urinating and dysuria.  Musculoskeletal: Negative for joint swelling and myalgias.  Skin: Negative for color change and rash.  Neurological: Negative for dizziness, light-headedness and headaches.  Psychiatric/Behavioral: Negative for agitation and dysphoric mood.       Objective:    Physical Exam  Constitutional: She appears well-developed and well-nourished. No distress.  HENT:  Nose: Nose normal.  Mouth/Throat: Oropharynx is clear and moist.  Neck: Neck supple. No thyromegaly present.  Cardiovascular: Normal rate and regular rhythm.  Pulmonary/Chest: Breath sounds normal. No respiratory distress. She has no wheezes.  Abdominal: Soft. Bowel sounds are normal. There is no tenderness.  Musculoskeletal: She exhibits no edema or tenderness.  Lymphadenopathy:    She has no cervical adenopathy.  Skin: No rash noted. No erythema.  Psychiatric: She has a normal mood and affect. Her behavior is normal.    BP 128/80 (BP Location: Left Arm, Patient Position: Sitting, Cuff Size: Large)   Pulse 100   Temp 97.7 F (36.5 C) (Oral)   Resp 18   Wt 172 lb 6.4 oz (78.2 kg)   SpO2 98%   BMI 30.06 kg/m  Wt Readings from Last 3 Encounters:  01/21/18 172 lb 6.4 oz (78.2 kg)  01/14/18 168 lb (76.2 kg)  12/25/17 169 lb (76.7 kg)     Lab Results  Component Value Date   WBC 8.7 01/10/2018   HGB 14.8 01/10/2018   HCT 43.0  01/10/2018   PLT 438 07/26/2017   GLUCOSE 99 01/10/2018   CHOL 189 01/10/2018   TRIG 202 (H) 01/10/2018   HDL 52 01/10/2018   LDLDIRECT 176.0 09/24/2014   LDLCALC 97 01/10/2018   ALT 30 01/10/2018   ALT 31 01/10/2018   AST 29 01/10/2018   AST 33 01/10/2018   NA 138 01/10/2018   K 4.0 01/10/2018   CL 98 01/10/2018   CREATININE 1.00 01/10/2018   BUN 16 01/10/2018   CO2 23 01/10/2018   TSH 1.670 01/10/2018  HGBA1C 6.1 (H) 01/10/2018    US Breast Ltd Uni Right Inc Axilla  Result Date: 10/12/2017 CLINICAL DATA:  Follow-up for probably benign mass in the RIGHT breast. The probably benign mass, suspected complicated cyst, was identified on diagnostic exam dated 04/06/2017. History of benign RIGHT breast excisional biopsy in 2010 and benign RIGHT breast core biopsy in 2014. EXAM: DIGITAL DIAGNOSTIC RIGHT MAMMOGRAM WITH CAD AND TOMO ULTRASOUND RIGHT BREAST COMPARISON:  Previous exams including diagnostic mammogram and ultrasound dated 04/06/2017. ACR Breast Density Category c: The breast tissue is heterogeneously dense, which may obscure small masses. FINDINGS: The low-density mass appreciated within the RIGHT breast on the previous diagnostic mammogram of 04/06/2017, at posterior depth, is not seen today, indicating resolved cyst or superimposition of normal fibroglandular tissues. There are no new dominant masses, suspicious calcifications or secondary signs of malignancy identified within the RIGHT breast on today's exam. Mammographic images were processed with CAD. Targeted ultrasound is performed, again showing an oval circumscribed hypoechoic mass in the RIGHT breast at the 8 o'clock axis, 6 cm from the nipple, measuring 6 x 3 x 6 mm, avascular, stable compared to the previous study. IMPRESSION: Stable probably benign mass in the RIGHT breast at the 8 o'clock axis, 6 cm from the nipple, measuring 6 mm, now without a definite mammographic correlate, most likely a complicated cyst with internal  debris. Recommend additional follow-up diagnostic exam in 6 months to ensure continued stability. This will be performed as a bilateral diagnostic mammogram, and RIGHT breast ultrasound, in conjunction with patient's routine LEFT breast screening mammogram schedule. RECOMMENDATION: Bilateral diagnostic mammogram, with RIGHT breast ultrasound, in 6 months. I have discussed the findings and recommendations with the patient. Results were also provided in writing at the conclusion of the visit. If applicable, a reminder letter will be sent to the patient regarding the next appointment. BI-RADS CATEGORY  3: Probably benign. Electronically Signed   By: Franki Cabot M.D.   On: 10/12/2017 12:00   Mm Diag Breast Tomo Uni Right  Result Date: 10/12/2017 CLINICAL DATA:  Follow-up for probably benign mass in the RIGHT breast. The probably benign mass, suspected complicated cyst, was identified on diagnostic exam dated 04/06/2017. History of benign RIGHT breast excisional biopsy in 2010 and benign RIGHT breast core biopsy in 2014. EXAM: DIGITAL DIAGNOSTIC RIGHT MAMMOGRAM WITH CAD AND TOMO ULTRASOUND RIGHT BREAST COMPARISON:  Previous exams including diagnostic mammogram and ultrasound dated 04/06/2017. ACR Breast Density Category c: The breast tissue is heterogeneously dense, which may obscure small masses. FINDINGS: The low-density mass appreciated within the RIGHT breast on the previous diagnostic mammogram of 04/06/2017, at posterior depth, is not seen today, indicating resolved cyst or superimposition of normal fibroglandular tissues. There are no new dominant masses, suspicious calcifications or secondary signs of malignancy identified within the RIGHT breast on today's exam. Mammographic images were processed with CAD. Targeted ultrasound is performed, again showing an oval circumscribed hypoechoic mass in the RIGHT breast at the 8 o'clock axis, 6 cm from the nipple, measuring 6 x 3 x 6 mm, avascular, stable compared to  the previous study. IMPRESSION: Stable probably benign mass in the RIGHT breast at the 8 o'clock axis, 6 cm from the nipple, measuring 6 mm, now without a definite mammographic correlate, most likely a complicated cyst with internal debris. Recommend additional follow-up diagnostic exam in 6 months to ensure continued stability. This will be performed as a bilateral diagnostic mammogram, and RIGHT breast ultrasound, in conjunction with patient's routine LEFT breast screening  mammogram schedule. RECOMMENDATION: Bilateral diagnostic mammogram, with RIGHT breast ultrasound, in 6 months. I have discussed the findings and recommendations with the patient. Results were also provided in writing at the conclusion of the visit. If applicable, a reminder letter will be sent to the patient regarding the next appointment. BI-RADS CATEGORY  3: Probably benign. Electronically Signed   By: Franki Cabot M.D.   On: 10/12/2017 12:00       Assessment & Plan:   Problem List Items Addressed This Visit    Abnormal liver enzymes    Previous abdominal ultrasound ok.  Continue diet and exercise.  Follow liver function tests.        Environmental allergies    Controlled on current regimen.  Follow.        Essential hypertension    Blood pressure under reasonable control.  Continue same medication regimen.  Follow pressures.  Follow metabolic panel.  Need to watch taking phentermine.        History of abnormal mammogram    Has seen Dr Bary Castilla.  Last mammogram 10/2017 - birads III.  Due f/u mammogram and ultrasound.  Need to schedule.        Hypercholesterolemia    On crestor.  Low cholesterol diet and exercise.  Follow lipid panel and liver function tests.        Thrombocytosis ([place])    Saw hematology.  Felt to be reactive.  Follow cbc.        Vitamin D deficiency    Follow vitamin D level.            Einar Pheasant, MD

## 2018-01-27 ENCOUNTER — Encounter: Payer: Self-pay | Admitting: Internal Medicine

## 2018-01-27 NOTE — Assessment & Plan Note (Signed)
Follow vitamin D level.  

## 2018-01-27 NOTE — Assessment & Plan Note (Signed)
Blood pressure under reasonable control.  Continue same medication regimen.  Follow pressures.  Follow metabolic panel.  Need to watch taking phentermine.

## 2018-01-27 NOTE — Assessment & Plan Note (Signed)
Has seen Dr Bary Castilla.  Last mammogram 10/2017 - birads III.  Due f/u mammogram and ultrasound.  Need to schedule.

## 2018-01-27 NOTE — Assessment & Plan Note (Signed)
Saw hematology.  Felt to be reactive.  Follow cbc.

## 2018-01-27 NOTE — Assessment & Plan Note (Signed)
On crestor.  Low cholesterol diet and exercise.  Follow lipid panel and liver function tests.   

## 2018-01-27 NOTE — Assessment & Plan Note (Signed)
Previous abdominal ultrasound ok.  Continue diet and exercise.  Follow liver function tests.

## 2018-01-27 NOTE — Assessment & Plan Note (Signed)
Controlled on current regimen.  Follow.  

## 2018-02-14 ENCOUNTER — Other Ambulatory Visit: Payer: 59

## 2018-02-14 ENCOUNTER — Ambulatory Visit: Payer: 59 | Admitting: Oncology

## 2018-03-01 ENCOUNTER — Ambulatory Visit: Payer: Self-pay | Admitting: Nurse Practitioner

## 2018-03-07 ENCOUNTER — Ambulatory Visit: Payer: 59 | Admitting: Nurse Practitioner

## 2018-03-07 ENCOUNTER — Encounter: Payer: Self-pay | Admitting: Nurse Practitioner

## 2018-03-07 VITALS — BP 140/76 | HR 85 | Temp 97.1°F | Resp 16 | Ht 63.5 in | Wt 168.4 lb

## 2018-03-07 DIAGNOSIS — I10 Essential (primary) hypertension: Secondary | ICD-10-CM | POA: Diagnosis not present

## 2018-03-07 DIAGNOSIS — E663 Overweight: Secondary | ICD-10-CM

## 2018-03-07 DIAGNOSIS — J309 Allergic rhinitis, unspecified: Secondary | ICD-10-CM | POA: Diagnosis not present

## 2018-03-07 MED ORDER — PHENTERMINE HCL 37.5 MG PO TABS: 37.5000 mg | ORAL_TABLET | Freq: Every day | ORAL | 1 refills | Status: DC

## 2018-03-07 NOTE — Progress Notes (Signed)
[facility] [place], [place] [address]  Internal MEDICINE  Office Visit Note  Patient Name: Rhonda Sanders  [id number]  [id number]  Date of Service: 03/10/2018  Chief Complaint  Patient presents with  . Medical Management of Chronic Issues    weight mangament  . Diarrhea  . Nausea    The patient is here for routine follow up visit. She is complaining of nausea, headache, and diarrhea. Started early this morning. Had several episodes of diarrhea which has resolved. She has been nauseated with headache since then. Has taken tylenol without relief of headache.  Here for follow up of weight management. Has really been off her exercise and diet routine. Has maintained her weight over the past two months despite this obstacle. No negative side effects related to taking phentermine. Blood pressure well maintained.       Current Medication: Outpatient Encounter Medications as of 03/07/2018  Medication Sig  . cetirizine (ZYRTEC) 10 MG tablet Take 1 tablet (10 mg total) by mouth daily.  . fluticasone (FLONASE) 50 MCG/ACT nasal spray Place 2 sprays into both nostrils daily.  . hydrochlorothiazide (HYDRODIURIL) 25 MG tablet Take 1 tablet (25 mg total) by mouth daily.  Marland Kitchen lisinopril (PRINIVIL,ZESTRIL) 30 MG tablet Take 1 tablet (30 mg total) by mouth daily.  . Magnesium 500 MG TABS Take by mouth daily.  . montelukast (SINGULAIR) 10 MG tablet Take 1 tablet (10 mg total) by mouth at bedtime.  . Multiple Vitamin (MULTIVITAMIN) tablet Take 1 tablet by mouth daily.  . phentermine (ADIPEX-P) 37.5 MG tablet Take 1 tablet (37.5 mg total) by mouth daily before breakfast.  . rosuvastatin (CRESTOR) 5 MG tablet Take 1 tablet (5 mg total) by mouth daily.  . [DISCONTINUED] phentermine (ADIPEX-P) 37.5 MG tablet Take 1 tablet (37.5 mg total) by mouth daily before breakfast.   No facility-administered encounter medications on file as of 03/07/2018.     Surgical History: Past Surgical  History:  Procedure Laterality Date  . ABDOMINAL HYSTERECTOMY  04/2006   partial  . BREAST BIOPSY Right 2014   CORE W/CLIP - NEG  . BREAST BIOPSY Right 2015   CORE W/OUT CLIP - NEG COLUMNAR CELL CHANGE WITH MICRO CALCIFICATIONS.   Marland Kitchen BREAST EXCISIONAL BIOPSY Right 2010   EXCISIONAL -ALH, columnar cell hyperplasia  . COLONOSCOPY WITH PROPOFOL N/A 05/26/2015   Procedure: COLONOSCOPY WITH PROPOFOL;  Surgeon: Robert Bellow, MD;  Location: [facility] ENDOSCOPY;  Service: Endoscopy;  Laterality: N/A;    Medical History: Past Medical History:  Diagnosis Date  . Allergy    seasonal   . Atypical lobular hyperplasia of right breast 12/25/2008   Sngle focus noted in area of columnar cell hyperplasia  . Hypercholesterolemia   . Hypertension   . PONV (postoperative nausea and vomiting)     Family History: Family History  Problem Relation Age of Onset  . Breast cancer Mother 27  . Heart disease Father        first MI (31s), died age 60 - MI  . Heart disease Paternal Grandfather   . Heart disease Paternal Uncle   . CVA Paternal Grandmother   . Breast cancer Maternal Aunt 69  . Crohn's disease Brother   . Stomach cancer Maternal Grandfather   . Colon cancer Neg Hx     Social History   Socioeconomic History  . Marital status: Single    Spouse name: Not on file  . Number of children: 0  . Years of education:  Not on file  . Highest education level: Not on file  Occupational History  . Not on file  Social Needs  . Financial resource strain: Not on file  . Food insecurity:    Worry: Not on file    Inability: Not on file  . Transportation needs:    Medical: Not on file    Non-medical: Not on file  Tobacco Use  . Smoking status: Never Smoker  . Smokeless tobacco: Never Used  Substance and Sexual Activity  . Alcohol use: Yes    Alcohol/week: 0.0 standard drinks    Comment: occasional - once a month  . Drug use: No  . Sexual activity: Not on file  Lifestyle  . Physical  activity:    Days per week: Not on file    Minutes per session: Not on file  . Stress: Not on file  Relationships  . Social connections:    Talks on phone: Not on file    Gets together: Not on file    Attends religious service: Not on file    Active member of club or organization: Not on file    Attends meetings of clubs or organizations: Not on file    Relationship status: Not on file  . Intimate partner violence:    Fear of current or ex partner: Not on file    Emotionally abused: Not on file    Physically abused: Not on file    Forced sexual activity: Not on file  Other Topics Concern  . Not on file  Social History Narrative  . Not on file      Review of Systems  Constitutional: Negative for chills, fatigue and unexpected weight change.       Has maintained her weight since her last visit.   HENT: Positive for congestion, postnasal drip, rhinorrhea, sinus pressure and sore throat. Negative for sneezing.   Respiratory: Positive for cough. Negative for chest tightness and shortness of breath.   Cardiovascular: Negative for chest pain and palpitations.  Gastrointestinal: Negative for abdominal pain, constipation, diarrhea, nausea and vomiting.  Endocrine: Negative for cold intolerance, heat intolerance, polydipsia and polyuria.  Musculoskeletal: Negative for arthralgias, back pain, joint swelling and neck pain.  Skin: Negative for rash.  Allergic/Immunologic: Positive for environmental allergies.  Neurological: Positive for headaches. Negative for dizziness, tremors and numbness.  Hematological: Positive for adenopathy. Does not bruise/bleed easily.  Psychiatric/Behavioral: Negative for behavioral problems (Depression), sleep disturbance and suicidal ideas. The patient is not nervous/anxious.     Today's Vitals   03/07/18 1552  BP: 140/76  Pulse: 85  Resp: 16  Temp: (!) 97.1 F (36.2 C)  SpO2: 98%  Weight: 168 lb 6.4 oz (76.4 kg)  Height: 5' 3.5" (1.613 m)     Physical Exam Vitals signs and nursing note reviewed.  Constitutional:      General: She is not in acute distress.    Appearance: Normal appearance. She is well-developed. She is not diaphoretic.  HENT:     Head: Normocephalic and atraumatic.     Right Ear: Tympanic membrane is not erythematous or bulging.     Left Ear: Tympanic membrane is not erythematous or bulging.     Nose: Congestion present.     Right Sinus: No maxillary sinus tenderness or frontal sinus tenderness.     Left Sinus: No maxillary sinus tenderness or frontal sinus tenderness.     Mouth/Throat:     Pharynx: No oropharyngeal exudate or posterior oropharyngeal erythema.  Eyes:  Pupils: Pupils are equal, round, and reactive to light.  Neck:     Musculoskeletal: Normal range of motion and neck supple.     Thyroid: No thyromegaly.     Vascular: No JVD.     Trachea: No tracheal deviation.  Cardiovascular:     Rate and Rhythm: Normal rate and regular rhythm.     Heart sounds: Normal heart sounds. No murmur. No friction rub. No gallop.   Pulmonary:     Effort: Pulmonary effort is normal. No respiratory distress.     Breath sounds: Normal breath sounds. No wheezing or rales.  Chest:     Chest wall: No tenderness.  Abdominal:     General: Bowel sounds are normal.     Palpations: Abdomen is soft.  Musculoskeletal: Normal range of motion.  Lymphadenopathy:     Cervical: No cervical adenopathy.  Skin:    General: Skin is warm and dry.  Neurological:     Mental Status: She is alert and oriented to person, place, and time.     Cranial Nerves: No cranial nerve deficit.  Psychiatric:        Behavior: Behavior normal.        Thought Content: Thought content normal.        Judgment: Judgment normal.    Assessment/Plan: 1. Chronic allergic rhinitis New symptoms likely related to exacerbation of allergies. Take medications as prescribed. Will send antibiotic if symptoms worsen or persist over next several days.    2. Essential hypertension Stable. Continue bp medication as prescribed   3. Overweight Restart phentermine. Limit calorie intake to 1200-1500 calories per day and incorporate exercise into daily routine.  - phentermine (ADIPEX-P) 37.5 MG tablet; Take 1 tablet (37.5 mg total) by mouth daily before breakfast.  Dispense: 30 tablet; Refill: 1  General Counseling: lilianah buffin understanding of the findings of todays visit and agrees with plan of treatment. I have discussed any further diagnostic evaluation that may be needed or ordered today. We also reviewed her medications today. she has been encouraged to call the office with any questions or concerns that should arise related to todays visit.   There is a liability release in patients' chart. There has been a 10 minute discussion about the side effects including but not limited to elevated blood pressure, anxiety, lack of sleep and dry mouth. Pt understands and will like to start/continue on appetite suppressant at this time. There will be one month RX given at the time of visit with proper follow up. Nova diet plan with restricted calories is given to the pt. Pt understands and agrees with  plan of treatment  This patient was seen by Leretha Pol FNP Collaboration with Dr Lavera Guise as a part of collaborative care agreement  Meds ordered this encounter  Medications  . phentermine (ADIPEX-P) 37.5 MG tablet    Sig: Take 1 tablet (37.5 mg total) by mouth daily before breakfast.    Dispense:  30 tablet    Refill:  1    Order Specific Question:   Supervising Provider    Answer:   Lavera Guise [[id number]]    Time spent: 65 Minutes      Dr Lavera Guise Internal medicine

## 2018-03-26 ENCOUNTER — Ambulatory Visit: Payer: 59 | Admitting: Internal Medicine

## 2018-04-08 ENCOUNTER — Ambulatory Visit
Admission: RE | Admit: 2018-04-08 | Discharge: 2018-04-08 | Disposition: A | Discharge status: Home / Self Care | Payer: 59 | Source: Ambulatory Visit | Attending: Internal Medicine | Admitting: Internal Medicine

## 2018-04-08 DIAGNOSIS — Z1239 Encounter for other screening for malignant neoplasm of breast: Secondary | ICD-10-CM

## 2018-04-08 DIAGNOSIS — R928 Other abnormal and inconclusive findings on diagnostic imaging of breast: Secondary | ICD-10-CM

## 2018-04-08 DIAGNOSIS — N6001 Solitary cyst of right breast: Secondary | ICD-10-CM | POA: Diagnosis not present

## 2018-04-08 DIAGNOSIS — R922 Inconclusive mammogram: Secondary | ICD-10-CM | POA: Diagnosis not present

## 2018-04-11 ENCOUNTER — Encounter: Payer: Self-pay | Admitting: Internal Medicine

## 2018-04-11 ENCOUNTER — Telehealth: Payer: Self-pay | Admitting: Internal Medicine

## 2018-04-11 ENCOUNTER — Ambulatory Visit: Payer: 59 | Admitting: Internal Medicine

## 2018-04-11 VITALS — BP 122/78 | HR 98 | Temp 98.2°F | Resp 16 | Wt 168.2 lb

## 2018-04-11 DIAGNOSIS — I10 Essential (primary) hypertension: Secondary | ICD-10-CM | POA: Diagnosis not present

## 2018-04-11 DIAGNOSIS — D75839 Thrombocytosis, unspecified: Secondary | ICD-10-CM

## 2018-04-11 DIAGNOSIS — R748 Abnormal levels of other serum enzymes: Secondary | ICD-10-CM | POA: Diagnosis not present

## 2018-04-11 DIAGNOSIS — D473 Essential (hemorrhagic) thrombocythemia: Secondary | ICD-10-CM | POA: Diagnosis not present

## 2018-04-11 DIAGNOSIS — E7800 Pure hypercholesterolemia, unspecified: Secondary | ICD-10-CM | POA: Diagnosis not present

## 2018-04-11 DIAGNOSIS — Z9109 Other allergy status, other than to drugs and biological substances: Secondary | ICD-10-CM

## 2018-04-11 DIAGNOSIS — G479 Sleep disorder, unspecified: Secondary | ICD-10-CM

## 2018-04-11 DIAGNOSIS — Z87898 Personal history of other specified conditions: Secondary | ICD-10-CM

## 2018-04-11 DIAGNOSIS — E559 Vitamin D deficiency, unspecified: Secondary | ICD-10-CM

## 2018-04-11 NOTE — Telephone Encounter (Signed)
Pt would like to have her labs done at [facility] in [place] before her CPE on 08/06/2018.

## 2018-04-11 NOTE — Telephone Encounter (Signed)
Labs ordered.

## 2018-04-11 NOTE — Progress Notes (Signed)
Patient ID: Rhonda Sanders, female   DOB: 11-Mar-1960, 58 y.o.   MRN: [id number]   Subjective:    Patient ID: Rhonda Sanders, female    DOB: 07-12-1960, 58 y.o.   MRN: [id number]  HPI  Patient here for a scheduled follow up.  She reports she is doing relatively well.  Increased stress at work.  Discussed with her today.  She is having some trouble staying asleep now.  Has noticed over the last month.  Discussed treatment options.  Discussed melatonin.  Stays active.  Has not been exercising as much recently.  Plans to get back in her routine.  No chest pain.  No sob.  No acid reflux.  No abdominal pain.  Bowels moving.  Off phentermine since January.  Blood pressure better.     Past Medical History:  Diagnosis Date  . Allergy    seasonal   . Atypical lobular hyperplasia of right breast 12/25/2008   Sngle focus noted in area of columnar cell hyperplasia  . Hypercholesterolemia   . Hypertension   . PONV (postoperative nausea and vomiting)    Past Surgical History:  Procedure Laterality Date  . ABDOMINAL HYSTERECTOMY  04/2006   partial  . BREAST BIOPSY Right 2014   CORE W/CLIP - NEG  . BREAST BIOPSY Right 2015   CORE W/OUT CLIP - NEG COLUMNAR CELL CHANGE WITH MICRO CALCIFICATIONS.   Marland Kitchen BREAST EXCISIONAL BIOPSY Right 2010   EXCISIONAL -ALH, columnar cell hyperplasia  . COLONOSCOPY WITH PROPOFOL N/A 05/26/2015   Procedure: COLONOSCOPY WITH PROPOFOL;  Surgeon: Robert Bellow, MD;  Location: [facility] ENDOSCOPY;  Service: Endoscopy;  Laterality: N/A;   Family History  Problem Relation Age of Onset  . Breast cancer Mother 69  . Heart disease Father        first MI (82s), died age 33 - MI  . Heart disease Paternal Grandfather   . Heart disease Paternal Uncle   . CVA Paternal Grandmother   . Breast cancer Maternal Aunt 14  . Crohn's disease Brother   . Stomach cancer Maternal Grandfather   . Colon cancer Neg Hx    Social History   Socioeconomic History  . Marital status: Single   Spouse name: Not on file  . Number of children: 0  . Years of education: Not on file  . Highest education level: Not on file  Occupational History  . Not on file  Social Needs  . Financial resource strain: Not on file  . Food insecurity:    Worry: Not on file    Inability: Not on file  . Transportation needs:    Medical: Not on file    Non-medical: Not on file  Tobacco Use  . Smoking status: Never Smoker  . Smokeless tobacco: Never Used  Substance and Sexual Activity  . Alcohol use: Yes    Alcohol/week: 0.0 standard drinks    Comment: occasional - once a month  . Drug use: No  . Sexual activity: Not on file  Lifestyle  . Physical activity:    Days per week: Not on file    Minutes per session: Not on file  . Stress: Not on file  Relationships  . Social connections:    Talks on phone: Not on file    Gets together: Not on file    Attends religious service: Not on file    Active member of club or organization: Not on file    Attends meetings of clubs or organizations: Not  on file    Relationship status: Not on file  Other Topics Concern  . Not on file  Social History Narrative  . Not on file    Outpatient Encounter Medications as of 04/11/2018  Medication Sig  . cetirizine (ZYRTEC) 10 MG tablet Take 1 tablet (10 mg total) by mouth daily.  . fluticasone (FLONASE) 50 MCG/ACT nasal spray Place 2 sprays into both nostrils daily.  . hydrochlorothiazide (HYDRODIURIL) 25 MG tablet Take 1 tablet (25 mg total) by mouth daily.  Marland Kitchen lisinopril (PRINIVIL,ZESTRIL) 30 MG tablet Take 1 tablet (30 mg total) by mouth daily.  . Magnesium 500 MG TABS Take by mouth daily.  . montelukast (SINGULAIR) 10 MG tablet Take 1 tablet (10 mg total) by mouth at bedtime.  . Multiple Vitamin (MULTIVITAMIN) tablet Take 1 tablet by mouth daily.  . phentermine (ADIPEX-P) 37.5 MG tablet Take 1 tablet (37.5 mg total) by mouth daily before breakfast.  . rosuvastatin (CRESTOR) 5 MG tablet Take 1 tablet (5 mg  total) by mouth daily.   No facility-administered encounter medications on file as of 04/11/2018.     Review of Systems  Constitutional: Negative for appetite change and unexpected weight change.  HENT: Negative for congestion and sinus pressure.   Respiratory: Negative for cough, chest tightness and shortness of breath.   Cardiovascular: Negative for chest pain, palpitations and leg swelling.  Gastrointestinal: Negative for abdominal pain, diarrhea, nausea and vomiting.  Genitourinary: Negative for difficulty urinating and dysuria.  Musculoskeletal: Negative for joint swelling and myalgias.  Skin: Negative for color change and rash.  Neurological: Negative for dizziness, light-headedness and headaches.  Psychiatric/Behavioral: Positive for sleep disturbance. Negative for agitation and dysphoric mood.       Objective:     Blood pressure rechecked by me:  118/78  Physical Exam Constitutional:      General: She is not in acute distress.    Appearance: Normal appearance.  HENT:     Nose: Nose normal. No congestion.     Mouth/Throat:     Pharynx: No oropharyngeal exudate or posterior oropharyngeal erythema.  Neck:     Musculoskeletal: Neck supple. No muscular tenderness.     Thyroid: No thyromegaly.  Cardiovascular:     Rate and Rhythm: Normal rate and regular rhythm.  Pulmonary:     Effort: No respiratory distress.     Breath sounds: Normal breath sounds. No wheezing.  Abdominal:     General: Bowel sounds are normal.     Palpations: Abdomen is soft.     Tenderness: There is no abdominal tenderness.  Musculoskeletal:        General: No swelling or tenderness.  Lymphadenopathy:     Cervical: No cervical adenopathy.  Skin:    Findings: No erythema or rash.  Neurological:     Mental Status: She is alert.  Psychiatric:        Mood and Affect: Mood normal.        Behavior: Behavior normal.     BP 122/78 (BP Location: Left Arm, Patient Position: Sitting, Cuff Size:  Normal)   Pulse 98   Temp 98.2 F (36.8 C) (Oral)   Resp 16   Wt 168 lb 3.2 oz (76.3 kg)   SpO2 98%   BMI 29.33 kg/m  Wt Readings from Last 3 Encounters:  04/11/18 168 lb 3.2 oz (76.3 kg)  03/07/18 168 lb 6.4 oz (76.4 kg)  01/21/18 172 lb 6.4 oz (78.2 kg)     Lab Results  Component  Value Date   WBC 8.7 01/10/2018   HGB 14.8 01/10/2018   HCT 43.0 01/10/2018   PLT 438 07/26/2017   GLUCOSE 99 01/10/2018   CHOL 189 01/10/2018   TRIG 202 (H) 01/10/2018   HDL 52 01/10/2018   LDLDIRECT 176.0 09/24/2014   LDLCALC 97 01/10/2018   ALT 30 01/10/2018   ALT 31 01/10/2018   AST 29 01/10/2018   AST 33 01/10/2018   NA 138 01/10/2018   K 4.0 01/10/2018   CL 98 01/10/2018   CREATININE 1.00 01/10/2018   BUN 16 01/10/2018   CO2 23 01/10/2018   TSH 1.670 01/10/2018   HGBA1C 6.1 (H) 01/10/2018    US Breast [place] Axilla  Result Date: 04/08/2018 CLINICAL DATA:  Follow-up for a probably benign right breast mass. EXAM: DIGITAL DIAGNOSTIC BILATERAL MAMMOGRAM WITH CAD AND TOMO ULTRASOUND RIGHT BREAST COMPARISON:  Previous exam(s). ACR Breast Density Category c: The breast tissue is heterogeneously dense, which may obscure small masses. FINDINGS: No suspicious calcifications, masses or areas of distortion are seen in the bilateral breasts. Mammographic images were processed with CAD. The right breast mass at 8 o'clock, 6 cm from the nipple measures 5 x 2 x 4 mm, previously measuring 6 x 3 x 6 mm. A benign cyst is seen in the right breast at 8 o'clock, 2 cm from the nipple measuring 6 x 3 x 4 mm. IMPRESSION: The probably benign right breast mass at 8 o'clock, 6 cm from the nipple is stable. RECOMMENDATION: Bilateral diagnostic mammogram and right breast ultrasound is recommended in 12 months. I have discussed the findings and recommendations with the patient. Results were also provided in writing at the conclusion of the visit. If applicable, a reminder letter will be sent to the patient  regarding the next appointment. BI-RADS CATEGORY  3: Probably benign. Electronically Signed   By: Ammie Ferrier M.D.   On: 04/08/2018 11:52   Mm Diag Breast Tomo Bilateral  Result Date: 04/08/2018 CLINICAL DATA:  Follow-up for a probably benign right breast mass. EXAM: DIGITAL DIAGNOSTIC BILATERAL MAMMOGRAM WITH CAD AND TOMO ULTRASOUND RIGHT BREAST COMPARISON:  Previous exam(s). ACR Breast Density Category c: The breast tissue is heterogeneously dense, which may obscure small masses. FINDINGS: No suspicious calcifications, masses or areas of distortion are seen in the bilateral breasts. Mammographic images were processed with CAD. The right breast mass at 8 o'clock, 6 cm from the nipple measures 5 x 2 x 4 mm, previously measuring 6 x 3 x 6 mm. A benign cyst is seen in the right breast at 8 o'clock, 2 cm from the nipple measuring 6 x 3 x 4 mm. IMPRESSION: The probably benign right breast mass at 8 o'clock, 6 cm from the nipple is stable. RECOMMENDATION: Bilateral diagnostic mammogram and right breast ultrasound is recommended in 12 months. I have discussed the findings and recommendations with the patient. Results were also provided in writing at the conclusion of the visit. If applicable, a reminder letter will be sent to the patient regarding the next appointment. BI-RADS CATEGORY  3: Probably benign. Electronically Signed   By: Ammie Ferrier M.D.   On: 04/08/2018 11:52       Assessment & Plan:   Problem List Items Addressed This Visit    Abnormal liver enzymes    Previous abdominal ultrasound - ok.  Continue diet and exercise.  Follow liver panel.        Environmental allergies    Controlled.  Essential hypertension - Primary    Blood pressure doing well as outlined.  Follow pressures.  Follow metabolic panel.        Relevant Orders   Basic metabolic panel   History of abnormal mammogram    Has seen Dr Bary Castilla.  Had mammogram and ultrasound - 04/08/18 - birads III.  Recommended  f/u diagnostic mammogram and right breast ultrasound in 12 months.        Hypercholesterolemia    On crestor.  Low cholesterol diet and exercise.  Follow lipid panel.        Relevant Orders   Hepatic function panel   Lipid panel   Sleeping difficulties    Discussed with her today. Does feel rested if she gets a good night sleep.  Increased stress at work.  Discussed melatonin.  Follow.        Thrombocytosis ([place])    Saw hematology.  Felt to be reactive.  Follow cbc.        Vitamin D deficiency    Follow vitamin D level.        Relevant Orders   VITAMIN D 25 Hydroxy (Vit-D Deficiency, Fractures)       Einar Pheasant, MD

## 2018-04-12 ENCOUNTER — Ambulatory Visit: Payer: 59 | Admitting: Internal Medicine

## 2018-04-14 ENCOUNTER — Encounter: Payer: Self-pay | Admitting: Internal Medicine

## 2018-04-14 DIAGNOSIS — G479 Sleep disorder, unspecified: Secondary | ICD-10-CM | POA: Insufficient documentation

## 2018-04-14 NOTE — Assessment & Plan Note (Signed)
Previous abdominal ultrasound - ok.  Continue diet and exercise.  Follow liver panel.

## 2018-04-14 NOTE — Assessment & Plan Note (Signed)
Saw hematology.  Felt to be reactive.  Follow cbc.

## 2018-04-14 NOTE — Assessment & Plan Note (Signed)
On crestor.  Low cholesterol diet and exercise.  Follow lipid panel.  

## 2018-04-14 NOTE — Assessment & Plan Note (Signed)
Discussed with her today. Does feel rested if she gets a good night sleep.  Increased stress at work.  Discussed melatonin.  Follow.

## 2018-04-14 NOTE — Assessment & Plan Note (Signed)
Follow vitamin D level.  

## 2018-04-14 NOTE — Assessment & Plan Note (Signed)
Blood pressure doing well as outlined.  Follow pressures.  Follow metabolic panel.   

## 2018-04-14 NOTE — Assessment & Plan Note (Signed)
Has seen Dr Bary Castilla.  Had mammogram and ultrasound - 04/08/18 - birads III.  Recommended f/u diagnostic mammogram and right breast ultrasound in 12 months.

## 2018-04-14 NOTE — Assessment & Plan Note (Signed)
Controlled.  

## 2018-04-18 ENCOUNTER — Ambulatory Visit: Payer: Self-pay | Admitting: Nurse Practitioner

## 2018-04-25 ENCOUNTER — Ambulatory Visit: Payer: Self-pay | Admitting: Nurse Practitioner

## 2018-05-03 ENCOUNTER — Ambulatory Visit: Payer: 59 | Admitting: Nurse Practitioner

## 2018-05-03 ENCOUNTER — Encounter: Payer: Self-pay | Admitting: Nurse Practitioner

## 2018-05-03 VITALS — BP 129/86 | HR 98 | Resp 16 | Ht 63.5 in | Wt 169.2 lb

## 2018-05-03 DIAGNOSIS — J309 Allergic rhinitis, unspecified: Secondary | ICD-10-CM | POA: Diagnosis not present

## 2018-05-03 DIAGNOSIS — E663 Overweight: Secondary | ICD-10-CM | POA: Diagnosis not present

## 2018-05-03 DIAGNOSIS — I10 Essential (primary) hypertension: Secondary | ICD-10-CM

## 2018-05-03 MED ORDER — PHENTERMINE HCL 37.5 MG PO TABS: 37.5000 mg | ORAL_TABLET | Freq: Every day | ORAL | 1 refills | Status: DC

## 2018-05-03 NOTE — Progress Notes (Signed)
[facility] [place], [place] [address]  Internal MEDICINE  Office Visit Note  Patient Name: Rhonda Sanders  [id number]  [id number]  Date of Service: 05/19/2018  Chief Complaint  Patient presents with  . Medical Management of Chronic Issues    6wk foloow up weight management    She is currently taking phentermine for weight management. She has lost 2 pounds since her last visit for weight management. Has maintained her weight since her last visit. Working lots of hours, generally 50 hours per week. Has not been going to the gym as often as she would like to. Most weeks, she is going twice per week. Will have additional help starting next week and plans to get to the gym more often. Maintains good dietary habits.       Current Medication: Outpatient Encounter Medications as of 05/03/2018  Medication Sig  . cetirizine (ZYRTEC) 10 MG tablet Take 1 tablet (10 mg total) by mouth daily.  . fluticasone (FLONASE) 50 MCG/ACT nasal spray Place 2 sprays into both nostrils daily.  . hydrochlorothiazide (HYDRODIURIL) 25 MG tablet Take 1 tablet (25 mg total) by mouth daily.  Marland Kitchen lisinopril (PRINIVIL,ZESTRIL) 30 MG tablet Take 1 tablet (30 mg total) by mouth daily.  . Magnesium 500 MG TABS Take by mouth daily.  . montelukast (SINGULAIR) 10 MG tablet Take 1 tablet (10 mg total) by mouth at bedtime.  . Multiple Vitamin (MULTIVITAMIN) tablet Take 1 tablet by mouth daily.  . phentermine (ADIPEX-P) 37.5 MG tablet Take 1 tablet (37.5 mg total) by mouth daily before breakfast.  . rosuvastatin (CRESTOR) 5 MG tablet Take 1 tablet (5 mg total) by mouth daily.  . [DISCONTINUED] phentermine (ADIPEX-P) 37.5 MG tablet Take 1 tablet (37.5 mg total) by mouth daily before breakfast.   No facility-administered encounter medications on file as of 05/03/2018.     Surgical History: Past Surgical History:  Procedure Laterality Date  . ABDOMINAL HYSTERECTOMY  04/2006   partial  . BREAST  BIOPSY Right 2014   CORE W/CLIP - NEG  . BREAST BIOPSY Right 2015   CORE W/OUT CLIP - NEG COLUMNAR CELL CHANGE WITH MICRO CALCIFICATIONS.   Marland Kitchen BREAST EXCISIONAL BIOPSY Right 2010   EXCISIONAL -ALH, columnar cell hyperplasia  . COLONOSCOPY WITH PROPOFOL N/A 05/26/2015   Procedure: COLONOSCOPY WITH PROPOFOL;  Surgeon: Robert Bellow, MD;  Location: [facility] ENDOSCOPY;  Service: Endoscopy;  Laterality: N/A;    Medical History: Past Medical History:  Diagnosis Date  . Allergy    seasonal   . Atypical lobular hyperplasia of right breast 12/25/2008   Sngle focus noted in area of columnar cell hyperplasia  . Hypercholesterolemia   . Hypertension   . PONV (postoperative nausea and vomiting)     Family History: Family History  Problem Relation Age of Onset  . Breast cancer Mother 62  . Heart disease Father        first MI (62s), died age 46 - MI  . Heart disease Paternal Grandfather   . Heart disease Paternal Uncle   . CVA Paternal Grandmother   . Breast cancer Maternal Aunt 58  . Crohn's disease Brother   . Stomach cancer Maternal Grandfather   . Colon cancer Neg Hx     Social History   Socioeconomic History  . Marital status: Single    Spouse name: Not on file  . Number of children: 0  . Years of education: Not on file  . Highest education level: Not  on file  Occupational History  . Not on file  Social Needs  . Financial resource strain: Not on file  . Food insecurity:    Worry: Not on file    Inability: Not on file  . Transportation needs:    Medical: Not on file    Non-medical: Not on file  Tobacco Use  . Smoking status: Never Smoker  . Smokeless tobacco: Never Used  Substance and Sexual Activity  . Alcohol use: Yes    Alcohol/week: 0.0 standard drinks    Comment: occasional - once a month  . Drug use: No  . Sexual activity: Not on file  Lifestyle  . Physical activity:    Days per week: Not on file    Minutes per session: Not on file  . Stress: Not on file   Relationships  . Social connections:    Talks on phone: Not on file    Gets together: Not on file    Attends religious service: Not on file    Active member of club or organization: Not on file    Attends meetings of clubs or organizations: Not on file    Relationship status: Not on file  . Intimate partner violence:    Fear of current or ex partner: Not on file    Emotionally abused: Not on file    Physically abused: Not on file    Forced sexual activity: Not on file  Other Topics Concern  . Not on file  Social History Narrative  . Not on file      Review of Systems  Constitutional: Negative for chills, fatigue and unexpected weight change.       Weight loss of two pounds since her last visit.   HENT: Negative for congestion, postnasal drip, rhinorrhea, sinus pressure, sneezing and sore throat.   Respiratory: Negative for cough, chest tightness and shortness of breath.   Cardiovascular: Negative for chest pain and palpitations.  Gastrointestinal: Negative for abdominal pain, constipation, diarrhea, nausea and vomiting.  Endocrine: Negative for cold intolerance, heat intolerance, polydipsia and polyuria.  Musculoskeletal: Negative for arthralgias, back pain, joint swelling and neck pain.  Skin: Negative for rash.  Allergic/Immunologic: Positive for environmental allergies.  Neurological: Positive for headaches. Negative for dizziness, tremors and numbness.  Hematological: Positive for adenopathy. Does not bruise/bleed easily.  Psychiatric/Behavioral: Negative for behavioral problems (Depression), sleep disturbance and suicidal ideas. The patient is not nervous/anxious.     Today's Vitals   05/03/18 1540  BP: 129/86  Pulse: 98  Resp: 16  SpO2: 96%  Weight: 169 lb 3.2 oz (76.7 kg)  Height: 5' 3.5" (1.613 m)   Body mass index is 29.5 kg/m.  Physical Exam Vitals signs and nursing note reviewed.  Constitutional:      General: She is not in acute distress.     Appearance: Normal appearance. She is well-developed. She is not diaphoretic.  HENT:     Head: Normocephalic and atraumatic.     Right Ear: Tympanic membrane is not erythematous or bulging.     Left Ear: Tympanic membrane is not erythematous or bulging.     Nose:     Right Sinus: No maxillary sinus tenderness or frontal sinus tenderness.     Left Sinus: No maxillary sinus tenderness or frontal sinus tenderness.     Mouth/Throat:     Pharynx: No oropharyngeal exudate or posterior oropharyngeal erythema.  Eyes:     Conjunctiva/sclera: Conjunctivae normal.     Pupils: Pupils are equal,  round, and reactive to light.  Neck:     Musculoskeletal: Normal range of motion and neck supple.     Thyroid: No thyromegaly.     Vascular: No JVD.     Trachea: No tracheal deviation.  Cardiovascular:     Rate and Rhythm: Normal rate and regular rhythm.     Heart sounds: Normal heart sounds. No murmur. No friction rub. No gallop.   Pulmonary:     Effort: Pulmonary effort is normal. No respiratory distress.     Breath sounds: Normal breath sounds. No wheezing or rales.  Chest:     Chest wall: No tenderness.  Abdominal:     General: Bowel sounds are normal.     Palpations: Abdomen is soft.  Musculoskeletal: Normal range of motion.  Lymphadenopathy:     Cervical: No cervical adenopathy.  Skin:    General: Skin is warm and dry.  Neurological:     Mental Status: She is alert and oriented to person, place, and time.     Cranial Nerves: No cranial nerve deficit.  Psychiatric:        Behavior: Behavior normal.        Thought Content: Thought content normal.        Judgment: Judgment normal.    Assessment/Plan: 1. Essential hypertension Stable. Continue bp medication as prescribed.   2. Overweight Improving. May continue phentermine 37.5mg  tablets daily. Limit claorie intake to 1200 calories per day. Continue to incorporate exercise into daily routine.  - phentermine (ADIPEX-P) 37.5 MG tablet; Take  1 tablet (37.5 mg total) by mouth daily before breakfast.  Dispense: 30 tablet; Refill: 1  3. Chronic allergic rhinitis Continue all allergy medication and nasal sprays as prescribed   General Counseling: Rhonda Sanders understanding of the findings of todays visit and agrees with plan of treatment. I have discussed any further diagnostic evaluation that may be needed or ordered today. We also reviewed her medications today. she has been encouraged to call the office with any questions or concerns that should arise related to todays visit.   There is a liability release in patients' chart. There has been a 10 minute discussion about the side effects including but not limited to elevated blood pressure, anxiety, lack of sleep and dry mouth. Pt understands and will like to start/continue on appetite suppressant at this time. There will be one month RX given at the time of visit with proper follow up. Nova diet plan with restricted calories is given to the pt. Pt understands and agrees with  plan of treatment  This patient was seen by Leretha Pol FNP Collaboration with Dr Lavera Guise as a part of collaborative care agreement  Meds ordered this encounter  Medications  . phentermine (ADIPEX-P) 37.5 MG tablet    Sig: Take 1 tablet (37.5 mg total) by mouth daily before breakfast.    Dispense:  30 tablet    Refill:  1    Order Specific Question:   Supervising Provider    Answer:   Lavera Guise [[id number]]    Time spent: 63 Minutes      Dr Lavera Guise Internal medicine

## 2018-05-08 ENCOUNTER — Ambulatory Visit: Payer: 59 | Admitting: Internal Medicine

## 2018-06-20 ENCOUNTER — Other Ambulatory Visit: Payer: Self-pay

## 2018-06-20 ENCOUNTER — Ambulatory Visit: Payer: 59 | Admitting: Nurse Practitioner

## 2018-06-20 ENCOUNTER — Encounter: Payer: Self-pay | Admitting: Nurse Practitioner

## 2018-06-20 DIAGNOSIS — J301 Allergic rhinitis due to pollen: Secondary | ICD-10-CM | POA: Diagnosis not present

## 2018-06-20 DIAGNOSIS — I10 Essential (primary) hypertension: Secondary | ICD-10-CM | POA: Diagnosis not present

## 2018-06-20 DIAGNOSIS — E663 Overweight: Secondary | ICD-10-CM

## 2018-06-20 MED ORDER — PHENTERMINE HCL 37.5 MG PO TABS: 37.5000 mg | ORAL_TABLET | Freq: Every day | ORAL | 1 refills | Status: DC

## 2018-06-20 NOTE — Progress Notes (Signed)
[facility] [place], [place] [address]  Internal MEDICINE  Telephone Visit  Patient Name: Rhonda Sanders  [id number]  [id number]  Date of Service: 06/20/2018  I connected with the patient at 9:02am by webcam and verified the patients identity using two identifiers.   I discussed the limitations, risks, security and privacy concerns of performing an evaluation and management service by webcam  and the availability of in person appointments. I also discussed with the patient that there may be a patient responsible charge related to the service.  The patient expressed understanding and agrees to proceed.    Chief Complaint  Patient presents with  . Telephone Assessment  . Telephone Screen  . Medical Management of Chronic Issues    Weight Management    The patient has been contacted via webcam for follow up visit due to concerns for spread of novel coronavirus. She is following up for weight management. She does take phentermine off and on. Has been off of this for a few weeks. She does not have a scale at home, but based on how her clothing fits. She has no concerns or complaints when taking this medication. She does have significant allergy to pollen and has a problem getting outside during this time of year. She takes allergy medication and uses nasal spray as prescribed. Thus far, she is doing well.       Current Medication: Outpatient Encounter Medications as of 06/20/2018  Medication Sig  . cetirizine (ZYRTEC) 10 MG tablet Take 1 tablet (10 mg total) by mouth daily.  . fluticasone (FLONASE) 50 MCG/ACT nasal spray Place 2 sprays into both nostrils daily.  . hydrochlorothiazide (HYDRODIURIL) 25 MG tablet Take 1 tablet (25 mg total) by mouth daily.  Marland Kitchen lisinopril (PRINIVIL,ZESTRIL) 30 MG tablet Take 1 tablet (30 mg total) by mouth daily.  . Magnesium 500 MG TABS Take by mouth daily.  . montelukast (SINGULAIR) 10 MG tablet Take 1 tablet (10 mg total) by mouth  at bedtime.  . Multiple Vitamin (MULTIVITAMIN) tablet Take 1 tablet by mouth daily.  . phentermine (ADIPEX-P) 37.5 MG tablet Take 1 tablet (37.5 mg total) by mouth daily before breakfast.  . rosuvastatin (CRESTOR) 5 MG tablet Take 1 tablet (5 mg total) by mouth daily.  . [DISCONTINUED] phentermine (ADIPEX-P) 37.5 MG tablet Take 1 tablet (37.5 mg total) by mouth daily before breakfast.   No facility-administered encounter medications on file as of 06/20/2018.     Surgical History: Past Surgical History:  Procedure Laterality Date  . ABDOMINAL HYSTERECTOMY  04/2006   partial  . BREAST BIOPSY Right 2014   CORE W/CLIP - NEG  . BREAST BIOPSY Right 2015   CORE W/OUT CLIP - NEG COLUMNAR CELL CHANGE WITH MICRO CALCIFICATIONS.   Marland Kitchen BREAST EXCISIONAL BIOPSY Right 2010   EXCISIONAL -ALH, columnar cell hyperplasia  . COLONOSCOPY WITH PROPOFOL N/A 05/26/2015   Procedure: COLONOSCOPY WITH PROPOFOL;  Surgeon: Robert Bellow, MD;  Location: [facility] ENDOSCOPY;  Service: Endoscopy;  Laterality: N/A;    Medical History: Past Medical History:  Diagnosis Date  . Allergy    seasonal   . Atypical lobular hyperplasia of right breast 12/25/2008   Sngle focus noted in area of columnar cell hyperplasia  . Hypercholesterolemia   . Hypertension   . PONV (postoperative nausea and vomiting)     Family History: Family History  Problem Relation Age of Onset  . Breast cancer Mother 28  . Heart disease Father  first MI (36s), died age 45 - MI  . Heart disease Paternal Grandfather   . Heart disease Paternal Uncle   . CVA Paternal Grandmother   . Breast cancer Maternal Aunt 1  . Crohn's disease Brother   . Stomach cancer Maternal Grandfather   . Colon cancer Neg Hx     Social History   Socioeconomic History  . Marital status: Single    Spouse name: Not on file  . Number of children: 0  . Years of education: Not on file  . Highest education level: Not on file  Occupational History  . Not on  file  Social Needs  . Financial resource strain: Not on file  . Food insecurity:    Worry: Not on file    Inability: Not on file  . Transportation needs:    Medical: Not on file    Non-medical: Not on file  Tobacco Use  . Smoking status: Never Smoker  . Smokeless tobacco: Never Used  Substance and Sexual Activity  . Alcohol use: Yes    Alcohol/week: 0.0 standard drinks    Comment: occasional - once a month  . Drug use: No  . Sexual activity: Not on file  Lifestyle  . Physical activity:    Days per week: Not on file    Minutes per session: Not on file  . Stress: Not on file  Relationships  . Social connections:    Talks on phone: Not on file    Gets together: Not on file    Attends religious service: Not on file    Active member of club or organization: Not on file    Attends meetings of clubs or organizations: Not on file    Relationship status: Not on file  . Intimate partner violence:    Fear of current or ex partner: Not on file    Emotionally abused: Not on file    Physically abused: Not on file    Forced sexual activity: Not on file  Other Topics Concern  . Not on file  Social History Narrative  . Not on file      Review of Systems  Constitutional: Negative for chills, fatigue and unexpected weight change.  HENT: Negative for congestion, postnasal drip, rhinorrhea, sinus pressure, sneezing and sore throat.   Respiratory: Negative for cough, chest tightness and shortness of breath.   Cardiovascular: Negative for chest pain and palpitations.  Gastrointestinal: Negative for abdominal pain, constipation, diarrhea, nausea and vomiting.  Endocrine: Negative for cold intolerance, heat intolerance, polydipsia and polyuria.  Musculoskeletal: Negative for arthralgias, back pain, joint swelling and neck pain.  Skin: Negative for rash.  Allergic/Immunologic: Positive for environmental allergies.  Neurological: Positive for headaches. Negative for dizziness, tremors and  numbness.  Hematological: Negative for adenopathy. Does not bruise/bleed easily.  Psychiatric/Behavioral: Negative for behavioral problems (Depression), sleep disturbance and suicidal ideas. The patient is not nervous/anxious.     Vital Signs: There were no vitals taken for this visit.   Observation/Objective:  The patient is alert and oriented. She has no evidence of congestion or acute allergies. She is in no acute distress.   Assessment/Plan: 1. Essential hypertension Doing well. Continue bp medication as prescribed   2. Non-seasonal allergic rhinitis due to pollen Continue all allergy medication and nasal sprays as prescribed.   3. Overweight May restart phentermine. Limit calorie intake to 1200 calories per day. Recommend she find exercise app on her phone to incorporate exercise into daily routine.  - phentermine (  ADIPEX-P) 37.5 MG tablet; Take 1 tablet (37.5 mg total) by mouth daily before breakfast.  Dispense: 30 tablet; Refill: 1  General Counseling: tanyla stege understanding of the findings of today's phone visit and agrees with plan of treatment. I have discussed any further diagnostic evaluation that may be needed or ordered today. We also reviewed her medications today. she has been encouraged to call the office with any questions or concerns that should arise related to todays visit.   There is a liability release in patients' chart. There has been a 10 minute discussion about the side effects including but not limited to elevated blood pressure, anxiety, lack of sleep and dry mouth. Pt understands and will like to start/continue on appetite suppressant at this time. There will be one month RX given at the time of visit with proper follow up. Nova diet plan with restricted calories is given to the pt. Pt understands and agrees with  plan of treatment  This patient was seen by Leretha Pol FNP Collaboration with Dr Lavera Guise as a part of collaborative care  agreement  Meds ordered this encounter  Medications  . phentermine (ADIPEX-P) 37.5 MG tablet    Sig: Take 1 tablet (37.5 mg total) by mouth daily before breakfast.    Dispense:  30 tablet    Refill:  1    Order Specific Question:   Supervising Provider    Answer:   Lavera Guise [[id number]]    Time spent: 40 Minutes    Dr Lavera Guise Internal medicine

## 2018-08-01 ENCOUNTER — Ambulatory Visit: Payer: 59 | Admitting: Nurse Practitioner

## 2018-08-06 ENCOUNTER — Ambulatory Visit: Payer: 59 | Admitting: Nurse Practitioner

## 2018-08-06 ENCOUNTER — Other Ambulatory Visit: Payer: Self-pay

## 2018-08-06 ENCOUNTER — Encounter: Payer: 59 | Admitting: Internal Medicine

## 2018-08-06 ENCOUNTER — Encounter: Payer: Self-pay | Admitting: Nurse Practitioner

## 2018-08-06 VITALS — Ht 63.5 in | Wt 168.0 lb

## 2018-08-06 DIAGNOSIS — J309 Allergic rhinitis, unspecified: Secondary | ICD-10-CM

## 2018-08-06 DIAGNOSIS — E663 Overweight: Secondary | ICD-10-CM | POA: Diagnosis not present

## 2018-08-06 DIAGNOSIS — I10 Essential (primary) hypertension: Secondary | ICD-10-CM | POA: Diagnosis not present

## 2018-08-06 MED ORDER — PHENTERMINE HCL 37.5 MG PO TABS: 37.5000 mg | ORAL_TABLET | Freq: Every day | ORAL | 1 refills | Status: DC

## 2018-08-06 NOTE — Progress Notes (Signed)
[facility] [place], [place] [address]  Internal MEDICINE  Telephone Visit  Patient Name: Rhonda Sanders  [id number]  [id number]  Date of Service: 08/14/2018  I connected with the patient at 2:59pm by webcam and verified the patients identity using two identifiers.   I discussed the limitations, risks, security and privacy concerns of performing an evaluation and management service by webcam and the availability of in person appointments. I also discussed with the patient that there may be a patient responsible charge related to the service.  The patient expressed understanding and agrees to proceed.    Chief Complaint  Patient presents with  . Telephone Screen    PHONE VISIT [phone]  . Telephone Assessment  . Medical Management of Chronic Issues    6wk follow up weight management    The patient has been contacted via webcam for follow up visit due to concerns for spread of novel coronavirus.  She is following up for weight management. She does take phentermine off and on. Has lost two pounds since restarting this medications. Has no negative side effects related to taking this medication. . She does not have a scale at home, but based on how her clothing fits. She has no concerns or complaints when taking this medication. She has been getting out every morning and walking for 2 miles. She states this feels great.  She takes allergy medication and uses nasal spray as prescribed. Thus far, she is doing well.        Current Medication: Outpatient Encounter Medications as of 08/06/2018  Medication Sig  . cetirizine (ZYRTEC) 10 MG tablet Take 1 tablet (10 mg total) by mouth daily.  . fluticasone (FLONASE) 50 MCG/ACT nasal spray Place 2 sprays into both nostrils daily.  . hydrochlorothiazide (HYDRODIURIL) 25 MG tablet Take 1 tablet (25 mg total) by mouth daily.  Marland Kitchen lisinopril (PRINIVIL,ZESTRIL) 30 MG tablet Take 1 tablet (30 mg total) by mouth daily.  .  Magnesium 500 MG TABS Take by mouth daily.  . montelukast (SINGULAIR) 10 MG tablet Take 1 tablet (10 mg total) by mouth at bedtime.  . Multiple Vitamin (MULTIVITAMIN) tablet Take 1 tablet by mouth daily.  . phentermine (ADIPEX-P) 37.5 MG tablet Take 1 tablet (37.5 mg total) by mouth daily before breakfast.  . rosuvastatin (CRESTOR) 5 MG tablet Take 1 tablet (5 mg total) by mouth daily.  . [DISCONTINUED] phentermine (ADIPEX-P) 37.5 MG tablet Take 1 tablet (37.5 mg total) by mouth daily before breakfast.   No facility-administered encounter medications on file as of 08/06/2018.     Surgical History: Past Surgical History:  Procedure Laterality Date  . ABDOMINAL HYSTERECTOMY  04/2006   partial  . BREAST BIOPSY Right 2014   CORE W/CLIP - NEG  . BREAST BIOPSY Right 2015   CORE W/OUT CLIP - NEG COLUMNAR CELL CHANGE WITH MICRO CALCIFICATIONS.   Marland Kitchen BREAST EXCISIONAL BIOPSY Right 2010   EXCISIONAL -ALH, columnar cell hyperplasia  . COLONOSCOPY WITH PROPOFOL N/A 05/26/2015   Procedure: COLONOSCOPY WITH PROPOFOL;  Surgeon: Robert Bellow, MD;  Location: [facility] ENDOSCOPY;  Service: Endoscopy;  Laterality: N/A;    Medical History: Past Medical History:  Diagnosis Date  . Allergy    seasonal   . Atypical lobular hyperplasia of right breast 12/25/2008   Sngle focus noted in area of columnar cell hyperplasia  . Hypercholesterolemia   . Hypertension   . PONV (postoperative nausea and vomiting)     Family History: Family History  Problem Relation Age of Onset  . Breast cancer Mother 83  . Heart disease Father        first MI (31s), died age 36 - [place]  . Heart disease Paternal Grandfather   . Heart disease Paternal Uncle   . CVA Paternal Grandmother   . Breast cancer Maternal Aunt 56  . Crohn's disease Brother   . Stomach cancer Maternal Grandfather   . Colon cancer Neg Hx     Social History   Socioeconomic History  . Marital status: Single    Spouse name: Not on file  . Number of  children: 0  . Years of education: Not on file  . Highest education level: Not on file  Occupational History  . Not on file  Social Needs  . Financial resource strain: Not on file  . Food insecurity:    Worry: Not on file    Inability: Not on file  . Transportation needs:    Medical: Not on file    Non-medical: Not on file  Tobacco Use  . Smoking status: Never Smoker  . Smokeless tobacco: Never Used  Substance and Sexual Activity  . Alcohol use: Yes    Alcohol/week: 0.0 standard drinks    Comment: occasional - once a month  . Drug use: No  . Sexual activity: Not on file  Lifestyle  . Physical activity:    Days per week: Not on file    Minutes per session: Not on file  . Stress: Not on file  Relationships  . Social connections:    Talks on phone: Not on file    Gets together: Not on file    Attends religious service: Not on file    Active member of club or organization: Not on file    Attends meetings of clubs or organizations: Not on file    Relationship status: Not on file  . Intimate partner violence:    Fear of current or ex partner: Not on file    Emotionally abused: Not on file    Physically abused: Not on file    Forced sexual activity: Not on file  Other Topics Concern  . Not on file  Social History Narrative  . Not on file      Review of Systems  Constitutional: Negative for chills, fatigue and unexpected weight change.  HENT: Negative for congestion, postnasal drip, rhinorrhea, sinus pressure, sneezing and sore throat.   Respiratory: Negative for cough, chest tightness and shortness of breath.   Cardiovascular: Negative for chest pain and palpitations.  Gastrointestinal: Negative for abdominal pain, constipation, diarrhea, nausea and vomiting.  Endocrine: Negative for cold intolerance, heat intolerance, polydipsia and polyuria.  Musculoskeletal: Negative for arthralgias, back pain, joint swelling and neck pain.  Skin: Negative for rash.   Allergic/Immunologic: Positive for environmental allergies.  Neurological: Positive for headaches. Negative for dizziness, tremors and numbness.  Hematological: Negative for adenopathy. Does not bruise/bleed easily.  Psychiatric/Behavioral: Negative for behavioral problems (Depression), sleep disturbance and suicidal ideas. The patient is not nervous/anxious.     Today's Vitals   08/06/18 1445  Weight: 168 lb (76.2 kg)  Height: 5' 3.5" (1.613 m)   Body mass index is 29.29 kg/m.  Observation/Objective:   The patient is alert and oriented. She is pleasant and answers all questions appropriately. Breathing is non-labored. She is in no acute distress at this time.    Assessment/Plan: 1. Essential hypertension Stable. Continue bp medication as prescribed   2. Chronic allergic rhinitis  Continue all allergy medication and nasal spray as prescribed   3. Overweight May continue phentermine 37.5mg  tablets daily. Limit calorie intake to 1200-1500 calories per day and incorporate exercise into daily routine.  - phentermine (ADIPEX-P) 37.5 MG tablet; Take 1 tablet (37.5 mg total) by mouth daily before breakfast.  Dispense: 30 tablet; Refill: 1  General Counseling: meera vasco understanding of the findings of today's phone visit and agrees with plan of treatment. I have discussed any further diagnostic evaluation that may be needed or ordered today. We also reviewed her medications today. she has been encouraged to call the office with any questions or concerns that should arise related to todays visit.   There is a liability release in patients' chart. There has been a 10 minute discussion about the side effects including but not limited to elevated blood pressure, anxiety, lack of sleep and dry mouth. Pt understands and will like to start/continue on appetite suppressant at this time. There will be one month RX given at the time of visit with proper follow up. Nova diet plan with  restricted calories is given to the pt. Pt understands and agrees with  plan of treatment  This patient was seen by Leretha Pol FNP Collaboration with Dr Lavera Guise as a part of collaborative care agreement  Meds ordered this encounter  Medications  . phentermine (ADIPEX-P) 37.5 MG tablet    Sig: Take 1 tablet (37.5 mg total) by mouth daily before breakfast.    Dispense:  30 tablet    Refill:  1    Order Specific Question:   Supervising Provider    Answer:   Lavera Guise [[id number]]    Time spent: 22 Minutes    Dr Lavera Guise Internal medicine

## 2018-09-19 ENCOUNTER — Encounter: Payer: Self-pay | Admitting: Nurse Practitioner

## 2018-09-19 ENCOUNTER — Ambulatory Visit: Payer: 59 | Admitting: Nurse Practitioner

## 2018-09-19 ENCOUNTER — Other Ambulatory Visit: Payer: Self-pay

## 2018-09-19 VITALS — BP 136/86 | HR 98 | Resp 16 | Ht 63.5 in | Wt 169.6 lb

## 2018-09-19 DIAGNOSIS — I10 Essential (primary) hypertension: Secondary | ICD-10-CM | POA: Diagnosis not present

## 2018-09-19 DIAGNOSIS — E663 Overweight: Secondary | ICD-10-CM

## 2018-09-19 DIAGNOSIS — J309 Allergic rhinitis, unspecified: Secondary | ICD-10-CM

## 2018-09-19 MED ORDER — PHENTERMINE HCL 37.5 MG PO TABS: 37.5000 mg | ORAL_TABLET | Freq: Every day | ORAL | 1 refills | Status: DC

## 2018-09-19 NOTE — Progress Notes (Signed)
[facility] [place], [place] [address]  Internal MEDICINE  Office Visit Note  Patient Name: Rhonda Sanders  [id number]  [id number]  Date of Service: 09/19/2018  Chief Complaint  Patient presents with  . Medical Management of Chronic Issues    6wk follow up weight management    She is currently taking phentermine for weight management. She has maintained her weight since her last in-office visit. Has not been going to the gym due to restrictions from [place] [address]. She is walking every morning about 2 miles, usually five mornings a week. This is weather permitting. She is taking phentermine most days. Is drinking much more water. Feels well overall.       Current Medication: Outpatient Encounter Medications as of 09/19/2018  Medication Sig  . cetirizine (ZYRTEC) 10 MG tablet Take 1 tablet (10 mg total) by mouth daily.  . fluticasone (FLONASE) 50 MCG/ACT nasal spray Place 2 sprays into both nostrils daily.  . hydrochlorothiazide (HYDRODIURIL) 25 MG tablet Take 1 tablet (25 mg total) by mouth daily.  Marland Kitchen lisinopril (PRINIVIL,ZESTRIL) 30 MG tablet Take 1 tablet (30 mg total) by mouth daily.  . Magnesium 500 MG TABS Take by mouth daily.  . montelukast (SINGULAIR) 10 MG tablet Take 1 tablet (10 mg total) by mouth at bedtime.  . Multiple Vitamin (MULTIVITAMIN) tablet Take 1 tablet by mouth daily.  . phentermine (ADIPEX-P) 37.5 MG tablet Take 1 tablet (37.5 mg total) by mouth daily before breakfast.  . rosuvastatin (CRESTOR) 5 MG tablet Take 1 tablet (5 mg total) by mouth daily.  . [DISCONTINUED] phentermine (ADIPEX-P) 37.5 MG tablet Take 1 tablet (37.5 mg total) by mouth daily before breakfast.   No facility-administered encounter medications on file as of 09/19/2018.     Surgical History: Past Surgical History:  Procedure Laterality Date  . ABDOMINAL HYSTERECTOMY  04/2006   partial  . BREAST BIOPSY Right 2014   CORE W/CLIP - NEG  . BREAST BIOPSY Right 2015   CORE W/OUT CLIP - NEG COLUMNAR CELL CHANGE WITH MICRO CALCIFICATIONS.   Marland Kitchen BREAST EXCISIONAL BIOPSY Right 2010   EXCISIONAL -ALH, columnar cell hyperplasia  . COLONOSCOPY WITH PROPOFOL N/A 05/26/2015   Procedure: COLONOSCOPY WITH PROPOFOL;  Surgeon: Robert Bellow, MD;  Location: [facility] ENDOSCOPY;  Service: Endoscopy;  Laterality: N/A;    Medical History: Past Medical History:  Diagnosis Date  . Allergy    seasonal   . Atypical lobular hyperplasia of right breast 12/25/2008   Sngle focus noted in area of columnar cell hyperplasia  . Hypercholesterolemia   . Hypertension   . PONV (postoperative nausea and vomiting)     Family History: Family History  Problem Relation Age of Onset  . Breast cancer Mother 34  . Heart disease Father        first MI (29s), died age 50 - [place]  . Heart disease Paternal Grandfather   . Heart disease Paternal Uncle   . CVA Paternal Grandmother   . Breast cancer Maternal Aunt 53  . Crohn's disease Brother   . Stomach cancer Maternal Grandfather   . Colon cancer Neg Hx     Social History   Socioeconomic History  . Marital status: Single    Spouse name: Not on file  . Number of children: 0  . Years of education: Not on file  . Highest education level: Not on file  Occupational History  . Not on file  Social Needs  . Financial resource strain:  Not on file  . Food insecurity    Worry: Not on file    Inability: Not on file  . Transportation needs    Medical: Not on file    Non-medical: Not on file  Tobacco Use  . Smoking status: Never Smoker  . Smokeless tobacco: Never Used  Substance and Sexual Activity  . Alcohol use: Yes    Alcohol/week: 0.0 standard drinks    Comment: occasionally  . Drug use: No  . Sexual activity: Not on file  Lifestyle  . Physical activity    Days per week: Not on file    Minutes per session: Not on file  . Stress: Not on file  Relationships  . Social Herbalist on phone: Not on file    Gets  together: Not on file    Attends religious service: Not on file    Active member of club or organization: Not on file    Attends meetings of clubs or organizations: Not on file    Relationship status: Not on file  . Intimate partner violence    Fear of current or ex partner: Not on file    Emotionally abused: Not on file    Physically abused: Not on file    Forced sexual activity: Not on file  Other Topics Concern  . Not on file  Social History Narrative  . Not on file      Review of Systems  Constitutional: Negative for chills, fatigue and unexpected weight change.       Maintained weight over past few months.   HENT: Negative for congestion, postnasal drip, rhinorrhea, sinus pressure, sneezing and sore throat.   Respiratory: Negative for cough, chest tightness and shortness of breath.   Cardiovascular: Negative for chest pain and palpitations.  Gastrointestinal: Negative for abdominal pain, constipation, diarrhea, nausea and vomiting.  Endocrine: Negative for cold intolerance, heat intolerance, polydipsia and polyuria.  Musculoskeletal: Negative for arthralgias, back pain, joint swelling and neck pain.  Skin: Negative for rash.  Allergic/Immunologic: Positive for environmental allergies.  Neurological: Positive for headaches. Negative for dizziness, tremors and numbness.  Hematological: Negative for adenopathy. Does not bruise/bleed easily.  Psychiatric/Behavioral: Negative for behavioral problems (Depression), sleep disturbance and suicidal ideas. The patient is not nervous/anxious.     Today's Vitals   09/19/18 1616  BP: 136/86  Pulse: 98  Resp: 16  SpO2: 97%  Weight: 169 lb 9.6 oz (76.9 kg)  Height: 5' 3.5" (1.613 m)   Body mass index is 29.57 kg/m.  Physical Exam Vitals signs and nursing note reviewed.  Constitutional:      General: She is not in acute distress.    Appearance: Normal appearance. She is well-developed. She is not diaphoretic.  HENT:     Head:  Normocephalic and atraumatic.     Right Ear: Tympanic membrane is not erythematous or bulging.     Left Ear: Tympanic membrane is not erythematous or bulging.     Nose:     Right Sinus: No maxillary sinus tenderness or frontal sinus tenderness.     Left Sinus: No maxillary sinus tenderness or frontal sinus tenderness.     Mouth/Throat:     Pharynx: No oropharyngeal exudate or posterior oropharyngeal erythema.  Eyes:     Conjunctiva/sclera: Conjunctivae normal.     Pupils: Pupils are equal, round, and reactive to light.  Neck:     Musculoskeletal: Normal range of motion and neck supple.     Thyroid: No  thyromegaly.     Vascular: No JVD.     Trachea: No tracheal deviation.  Cardiovascular:     Rate and Rhythm: Normal rate and regular rhythm.     Heart sounds: Normal heart sounds. No murmur. No friction rub. No gallop.   Pulmonary:     Effort: Pulmonary effort is normal. No respiratory distress.     Breath sounds: Normal breath sounds. No wheezing or rales.  Chest:     Chest wall: No tenderness.  Abdominal:     General: Bowel sounds are normal.     Palpations: Abdomen is soft.  Musculoskeletal: Normal range of motion.  Lymphadenopathy:     Cervical: No cervical adenopathy.  Skin:    General: Skin is warm and dry.  Neurological:     Mental Status: She is alert and oriented to person, place, and time.     Cranial Nerves: No cranial nerve deficit.  Psychiatric:        Behavior: Behavior normal.        Thought Content: Thought content normal.        Judgment: Judgment normal.    Assessment/Plan:  1. Essential hypertension Stable. Continue bp medication as prescribed   2. Chronic allergic rhinitis Continue allergy medications as prescribed   3. Overweight Stable. May continue phentermine daily. Limit calorie intake to 1200 calories per day and exercise daily.  - phentermine (ADIPEX-P) 37.5 MG tablet; Take 1 tablet (37.5 mg total) by mouth daily before breakfast.  Dispense:  30 tablet; Refill: 1 General Counseling: afnan cadiente understanding of the findings of todays visit and agrees with plan of treatment. I have discussed any further diagnostic evaluation that may be needed or ordered today. We also reviewed her medications today. she has been encouraged to call the office with any questions or concerns that should arise related to todays visit.   There is a liability release in patients' chart. There has been a 10 minute discussion about the side effects including but not limited to elevated blood pressure, anxiety, lack of sleep and dry mouth. Pt understands and will like to start/continue on appetite suppressant at this time. There will be one month RX given at the time of visit with proper follow up. Nova diet plan with restricted calories is given to the pt. Pt understands and agrees with  plan of treatment  This patient was seen by Leretha Pol FNP Collaboration with Dr Lavera Guise as a part of collaborative care agreement   Meds ordered this encounter  Medications  . phentermine (ADIPEX-P) 37.5 MG tablet    Sig: Take 1 tablet (37.5 mg total) by mouth daily before breakfast.    Dispense:  30 tablet    Refill:  1    Order Specific Question:   Supervising Provider    Answer:   Lavera Guise [[id number]]    Time spent: 14 Minutes      Dr Lavera Guise Internal medicine

## 2018-10-07 ENCOUNTER — Other Ambulatory Visit: Payer: Self-pay

## 2018-10-07 MED ORDER — VALACYCLOVIR HCL 1 G PO TABS: 1000.0000 mg | ORAL_TABLET | Freq: Every day | ORAL | 0 refills | Status: DC

## 2018-10-08 ENCOUNTER — Other Ambulatory Visit: Payer: Self-pay

## 2018-10-08 MED ORDER — VALACYCLOVIR HCL 1 G PO TABS: ORAL_TABLET | ORAL | 0 refills | Status: DC

## 2018-11-01 ENCOUNTER — Ambulatory Visit: Payer: Self-pay | Admitting: Nurse Practitioner

## 2018-11-08 ENCOUNTER — Encounter: Payer: Self-pay | Admitting: Nurse Practitioner

## 2018-11-08 ENCOUNTER — Other Ambulatory Visit: Payer: Self-pay

## 2018-11-08 ENCOUNTER — Ambulatory Visit: Payer: 59 | Admitting: Nurse Practitioner

## 2018-11-08 VITALS — BP 125/85 | HR 99 | Resp 16 | Ht 63.5 in | Wt 172.0 lb

## 2018-11-08 DIAGNOSIS — I10 Essential (primary) hypertension: Secondary | ICD-10-CM

## 2018-11-08 DIAGNOSIS — E663 Overweight: Secondary | ICD-10-CM

## 2018-11-08 DIAGNOSIS — J309 Allergic rhinitis, unspecified: Secondary | ICD-10-CM | POA: Diagnosis not present

## 2018-11-08 MED ORDER — PHENTERMINE HCL 37.5 MG PO TABS: 37.5000 mg | ORAL_TABLET | Freq: Every day | ORAL | 0 refills | Status: DC

## 2018-11-08 NOTE — Progress Notes (Signed)
[facility] [place], [place] [address]  Internal MEDICINE  Office Visit Note  Patient Name: Rhonda Sanders  [id number]  [id number]  Date of Service: 11/08/2018  Chief Complaint  Patient presents with  . Medical Management of Chronic Issues    weight management    The patient is here for follow up of weight management. She is currently on phentermine to help with appetite suppressant. She is exercising daily. She has gained three pounds since her last visit. She feels like she is not eating properly. She continues to work from home. She typically does not get hungry until lunchtime. Sits and works for hours before getting up to walk around. She understands she needs to move more and not sit at her work station for so long without getting up to move around. She has no negative side effects. Blood pressure is well controlled.       Current Medication: Outpatient Encounter Medications as of 11/08/2018  Medication Sig  . cetirizine (ZYRTEC) 10 MG tablet Take 1 tablet (10 mg total) by mouth daily.  . fluticasone (FLONASE) 50 MCG/ACT nasal spray Place 2 sprays into both nostrils daily.  . hydrochlorothiazide (HYDRODIURIL) 25 MG tablet Take 1 tablet (25 mg total) by mouth daily.  Marland Kitchen lisinopril (PRINIVIL,ZESTRIL) 30 MG tablet Take 1 tablet (30 mg total) by mouth daily.  . Magnesium 500 MG TABS Take by mouth daily.  . montelukast (SINGULAIR) 10 MG tablet Take 1 tablet (10 mg total) by mouth at bedtime.  . Multiple Vitamin (MULTIVITAMIN) tablet Take 1 tablet by mouth daily.  . phentermine (ADIPEX-P) 37.5 MG tablet Take 1 tablet (37.5 mg total) by mouth daily before breakfast.  . rosuvastatin (CRESTOR) 5 MG tablet Take 1 tablet (5 mg total) by mouth daily.  . valACYclovir (VALTREX) 1000 MG tablet Take 1 tab po twice a day for 5 days prn for fever blister  . [DISCONTINUED] phentermine (ADIPEX-P) 37.5 MG tablet Take 1 tablet (37.5 mg total) by mouth daily before breakfast.    No facility-administered encounter medications on file as of 11/08/2018.     Surgical History: Past Surgical History:  Procedure Laterality Date  . ABDOMINAL HYSTERECTOMY  04/2006   partial  . BREAST BIOPSY Right 2014   CORE W/CLIP - NEG  . BREAST BIOPSY Right 2015   CORE W/OUT CLIP - NEG COLUMNAR CELL CHANGE WITH MICRO CALCIFICATIONS.   Marland Kitchen BREAST EXCISIONAL BIOPSY Right 2010   EXCISIONAL -ALH, columnar cell hyperplasia  . COLONOSCOPY WITH PROPOFOL N/A 05/26/2015   Procedure: COLONOSCOPY WITH PROPOFOL;  Surgeon: Robert Bellow, MD;  Location: [facility] ENDOSCOPY;  Service: Endoscopy;  Laterality: N/A;    Medical History: Past Medical History:  Diagnosis Date  . Allergy    seasonal   . Atypical lobular hyperplasia of right breast 12/25/2008   Sngle focus noted in area of columnar cell hyperplasia  . Hypercholesterolemia   . Hypertension   . PONV (postoperative nausea and vomiting)     Family History: Family History  Problem Relation Age of Onset  . Breast cancer Mother 10  . Heart disease Father        first MI (37s), died age 56 - MI  . Heart disease Paternal Grandfather   . Heart disease Paternal Uncle   . CVA Paternal Grandmother   . Breast cancer Maternal Aunt 64  . Crohn's disease Brother   . Stomach cancer Maternal Grandfather   . Colon cancer Neg Hx  Social History   Socioeconomic History  . Marital status: Single    Spouse name: Not on file  . Number of children: 0  . Years of education: Not on file  . Highest education level: Not on file  Occupational History  . Not on file  Social Needs  . Financial resource strain: Not on file  . Food insecurity    Worry: Not on file    Inability: Not on file  . Transportation needs    Medical: Not on file    Non-medical: Not on file  Tobacco Use  . Smoking status: Never Smoker  . Smokeless tobacco: Never Used  Substance and Sexual Activity  . Alcohol use: Yes    Alcohol/week: 0.0 standard drinks     Comment: occasionally  . Drug use: No  . Sexual activity: Not on file  Lifestyle  . Physical activity    Days per week: Not on file    Minutes per session: Not on file  . Stress: Not on file  Relationships  . Social Herbalist on phone: Not on file    Gets together: Not on file    Attends religious service: Not on file    Active member of club or organization: Not on file    Attends meetings of clubs or organizations: Not on file    Relationship status: Not on file  . Intimate partner violence    Fear of current or ex partner: Not on file    Emotionally abused: Not on file    Physically abused: Not on file    Forced sexual activity: Not on file  Other Topics Concern  . Not on file  Social History Narrative  . Not on file      Review of Systems  Constitutional: Negative for chills, fatigue and unexpected weight change.       Three pound weight gain since her last visit.   HENT: Negative for congestion, postnasal drip, rhinorrhea, sinus pressure, sneezing and sore throat.   Respiratory: Negative for cough, chest tightness and shortness of breath.   Cardiovascular: Negative for chest pain and palpitations.  Gastrointestinal: Negative for abdominal pain, constipation, diarrhea, nausea and vomiting.  Endocrine: Negative for cold intolerance, heat intolerance, polydipsia and polyuria.  Musculoskeletal: Negative for arthralgias, back pain, joint swelling and neck pain.  Skin: Negative for rash.  Allergic/Immunologic: Positive for environmental allergies.  Neurological: Positive for headaches. Negative for dizziness, tremors and numbness.  Hematological: Negative for adenopathy. Does not bruise/bleed easily.  Psychiatric/Behavioral: Negative for behavioral problems (Depression), sleep disturbance and suicidal ideas. The patient is not nervous/anxious.     Today's Vitals   11/08/18 1536  BP: 125/85  Pulse: 99  Resp: 16  SpO2: 96%  Weight: 172 lb (78 kg)  Height:  5' 3.5" (1.613 m)   Body mass index is 29.99 kg/m.  Physical Exam Vitals signs and nursing note reviewed.  Constitutional:      General: She is not in acute distress.    Appearance: Normal appearance. She is well-developed. She is not diaphoretic.  HENT:     Head: Normocephalic and atraumatic.     Right Ear: Tympanic membrane is not erythematous or bulging.     Left Ear: Tympanic membrane is not erythematous or bulging.     Nose:     Right Sinus: No maxillary sinus tenderness or frontal sinus tenderness.     Left Sinus: No maxillary sinus tenderness or frontal sinus tenderness.  Mouth/Throat:     Pharynx: No oropharyngeal exudate or posterior oropharyngeal erythema.  Eyes:     Conjunctiva/sclera: Conjunctivae normal.     Pupils: Pupils are equal, round, and reactive to light.  Neck:     Musculoskeletal: Normal range of motion and neck supple.     Thyroid: No thyromegaly.     Vascular: No JVD.     Trachea: No tracheal deviation.  Cardiovascular:     Rate and Rhythm: Normal rate and regular rhythm.     Heart sounds: Normal heart sounds. No murmur. No friction rub. No gallop.   Pulmonary:     Effort: Pulmonary effort is normal. No respiratory distress.     Breath sounds: Normal breath sounds. No wheezing or rales.  Chest:     Chest wall: No tenderness.  Abdominal:     Palpations: Abdomen is soft.  Musculoskeletal: Normal range of motion.  Lymphadenopathy:     Cervical: No cervical adenopathy.  Skin:    General: Skin is warm and dry.  Neurological:     Mental Status: She is alert and oriented to person, place, and time.     Cranial Nerves: No cranial nerve deficit.  Psychiatric:        Behavior: Behavior normal.        Thought Content: Thought content normal.        Judgment: Judgment normal.    Assessment/Plan: 1. Essential hypertension Stable. Continue bp medication as prescribed   2. Chronic allergic rhinitis Well managed. Continue all allergy medications as  prescribed   3. Overweight Specific goals made for this month. Continue with regular exercise. Ensure, at least, small meals at breakfast, lunch, and dinner. She should be getting up to walk away from work station for five to ten minutes ever hour to stretch and keep metabolism active. Plan is to get back to 169 pounds at next visit in one month. If goal not met, will discontinue phentermine.  - phentermine (ADIPEX-P) 37.5 MG tablet; Take 1 tablet (37.5 mg total) by mouth daily before breakfast.  Dispense: 30 tablet; Refill: 0  General Counseling: yuli lanigan understanding of the findings of todays visit and agrees with plan of treatment. I have discussed any further diagnostic evaluation that may be needed or ordered today. We also reviewed her medications today. she has been encouraged to call the office with any questions or concerns that should arise related to todays visit.     There is a liability release in patients' chart. There has been a 10 minute discussion about the side effects including but not limited to elevated blood pressure, anxiety, lack of sleep and dry mouth. Pt understands and will like to start/continue on appetite suppressant at this time. There will be one month RX given at the time of visit with proper follow up. Nova diet plan with restricted calories is given to the pt. Pt understands and agrees with  plan of treatment  This patient was seen by Leretha Pol FNP Collaboration with Dr Lavera Guise as a part of collaborative care agreement  Meds ordered this encounter  Medications  . phentermine (ADIPEX-P) 37.5 MG tablet    Sig: Take 1 tablet (37.5 mg total) by mouth daily before breakfast.    Dispense:  30 tablet    Refill:  0    Order Specific Question:   Supervising Provider    Answer:   Lavera Guise [[id number]]    Time spent: 15 Minutes  Dr Fozia M Khan Internal medicine  

## 2018-11-25 ENCOUNTER — Other Ambulatory Visit: Payer: Self-pay

## 2018-11-25 DIAGNOSIS — J309 Allergic rhinitis, unspecified: Secondary | ICD-10-CM

## 2018-11-25 DIAGNOSIS — I10 Essential (primary) hypertension: Secondary | ICD-10-CM

## 2018-11-25 MED ORDER — HYDROCHLOROTHIAZIDE 25 MG PO TABS: 25.0000 mg | ORAL_TABLET | Freq: Every day | ORAL | 3 refills | Status: DC

## 2018-11-25 MED ORDER — CETIRIZINE HCL 10 MG PO TABS: 10.0000 mg | ORAL_TABLET | Freq: Every day | ORAL | 3 refills | Status: DC

## 2018-11-28 ENCOUNTER — Other Ambulatory Visit: Payer: Self-pay

## 2018-11-28 DIAGNOSIS — J309 Allergic rhinitis, unspecified: Secondary | ICD-10-CM

## 2018-11-28 MED ORDER — CETIRIZINE HCL 10 MG PO TABS: 10.0000 mg | ORAL_TABLET | Freq: Every day | ORAL | 3 refills | Status: DC

## 2018-12-11 ENCOUNTER — Other Ambulatory Visit: Payer: Self-pay

## 2018-12-11 DIAGNOSIS — J309 Allergic rhinitis, unspecified: Secondary | ICD-10-CM

## 2018-12-11 MED ORDER — MONTELUKAST SODIUM 10 MG PO TABS: 10.0000 mg | ORAL_TABLET | Freq: Every day | ORAL | 3 refills | Status: DC

## 2018-12-12 ENCOUNTER — Encounter: Payer: Self-pay | Admitting: Nurse Practitioner

## 2018-12-12 ENCOUNTER — Telehealth: Payer: Self-pay | Admitting: Internal Medicine

## 2018-12-12 ENCOUNTER — Ambulatory Visit: Payer: 59 | Admitting: Nurse Practitioner

## 2018-12-12 VITALS — BP 132/94 | HR 94 | Temp 97.4°F | Resp 16 | Ht 63.0 in | Wt 172.8 lb

## 2018-12-12 DIAGNOSIS — I10 Essential (primary) hypertension: Secondary | ICD-10-CM | POA: Diagnosis not present

## 2018-12-12 DIAGNOSIS — E663 Overweight: Secondary | ICD-10-CM

## 2018-12-12 DIAGNOSIS — J309 Allergic rhinitis, unspecified: Secondary | ICD-10-CM

## 2018-12-12 NOTE — Telephone Encounter (Signed)
Called pt to confirm appt for 10/13 Pt stated that she has another PCP that will now be following her care at [facility]

## 2018-12-13 ENCOUNTER — Other Ambulatory Visit: Payer: Self-pay

## 2018-12-13 ENCOUNTER — Encounter: Payer: 59 | Admitting: Internal Medicine

## 2018-12-13 DIAGNOSIS — I10 Essential (primary) hypertension: Secondary | ICD-10-CM

## 2018-12-13 MED ORDER — LISINOPRIL 30 MG PO TABS: 30.0000 mg | ORAL_TABLET | Freq: Every day | ORAL | 3 refills | Status: DC

## 2018-12-17 ENCOUNTER — Ambulatory Visit: Payer: 59 | Admitting: Internal Medicine

## 2018-12-18 ENCOUNTER — Other Ambulatory Visit: Payer: Self-pay

## 2018-12-18 DIAGNOSIS — I10 Essential (primary) hypertension: Secondary | ICD-10-CM

## 2018-12-18 MED ORDER — LISINOPRIL 30 MG PO TABS: 30.0000 mg | ORAL_TABLET | Freq: Every day | ORAL | 3 refills | Status: DC

## 2018-12-18 NOTE — Progress Notes (Signed)
[facility] [place], [place] [address]  Internal MEDICINE  Office Visit Note  Patient Name: Rhonda Sanders  [id number]  [id number]  Date of Service: 12/18/2018  Chief Complaint  Patient presents with  . Follow-up    weight management  . Hypertension    The patient is here for follow up of weight management. She is currently on phentermine to help with appetite suppressant. She is exercising daily. She has maintained her weight since her last visit.  She feels like she is not eating properly. She continues to work from home. She typically does not get hungry until lunchtime. Sits and works for hours before getting up to walk around. She understands she needs to move more and not sit at her work station for so long without getting up to move around. She has no negative side effects. Blood pressure is well controlled.       Current Medication: Outpatient Encounter Medications as of 12/12/2018  Medication Sig  . cetirizine (ZYRTEC) 10 MG tablet Take 1 tablet (10 mg total) by mouth daily.  . fluticasone (FLONASE) 50 MCG/ACT nasal spray Place 2 sprays into both nostrils daily.  . hydrochlorothiazide (HYDRODIURIL) 25 MG tablet Take 1 tablet (25 mg total) by mouth daily.  . Magnesium 500 MG TABS Take by mouth daily.  . montelukast (SINGULAIR) 10 MG tablet Take 1 tablet (10 mg total) by mouth at bedtime.  . Multiple Vitamin (MULTIVITAMIN) tablet Take 1 tablet by mouth daily.  . phentermine (ADIPEX-P) 37.5 MG tablet Take 1 tablet (37.5 mg total) by mouth daily before breakfast.  . rosuvastatin (CRESTOR) 5 MG tablet Take 1 tablet (5 mg total) by mouth daily.  . valACYclovir (VALTREX) 1000 MG tablet Take 1 tab po twice a day for 5 days prn for fever blister  . [DISCONTINUED] lisinopril (PRINIVIL,ZESTRIL) 30 MG tablet Take 1 tablet (30 mg total) by mouth daily.   No facility-administered encounter medications on file as of 12/12/2018.     Surgical History: Past  Surgical History:  Procedure Laterality Date  . ABDOMINAL HYSTERECTOMY  04/2006   partial  . BREAST BIOPSY Right 2014   CORE W/CLIP - NEG  . BREAST BIOPSY Right 2015   CORE W/OUT CLIP - NEG COLUMNAR CELL CHANGE WITH MICRO CALCIFICATIONS.   Marland Kitchen BREAST EXCISIONAL BIOPSY Right 2010   EXCISIONAL -ALH, columnar cell hyperplasia  . COLONOSCOPY WITH PROPOFOL N/A 05/26/2015   Procedure: COLONOSCOPY WITH PROPOFOL;  Surgeon: Robert Bellow, MD;  Location: [facility] ENDOSCOPY;  Service: Endoscopy;  Laterality: N/A;    Medical History: Past Medical History:  Diagnosis Date  . Allergy    seasonal   . Atypical lobular hyperplasia of right breast 12/25/2008   Sngle focus noted in area of columnar cell hyperplasia  . Hypercholesterolemia   . Hypertension   . PONV (postoperative nausea and vomiting)     Family History: Family History  Problem Relation Age of Onset  . Breast cancer Mother 59  . Heart disease Father        first MI (64s), died age 25 - [place]  . Heart disease Paternal Grandfather   . Heart disease Paternal Uncle   . CVA Paternal Grandmother   . Breast cancer Maternal Aunt 4  . Crohn's disease Brother   . Stomach cancer Maternal Grandfather   . Colon cancer Neg Hx     Social History   Socioeconomic History  . Marital status: Single    Spouse name: Not on  file  . Number of children: 0  . Years of education: Not on file  . Highest education level: Not on file  Occupational History  . Not on file  Social Needs  . Financial resource strain: Not on file  . Food insecurity    Worry: Not on file    Inability: Not on file  . Transportation needs    Medical: Not on file    Non-medical: Not on file  Tobacco Use  . Smoking status: Never Smoker  . Smokeless tobacco: Never Used  Substance and Sexual Activity  . Alcohol use: Yes    Alcohol/week: 0.0 standard drinks    Comment: occasionally  . Drug use: No  . Sexual activity: Not on file  Lifestyle  . Physical activity     Days per week: Not on file    Minutes per session: Not on file  . Stress: Not on file  Relationships  . Social Herbalist on phone: Not on file    Gets together: Not on file    Attends religious service: Not on file    Active member of club or organization: Not on file    Attends meetings of clubs or organizations: Not on file    Relationship status: Not on file  . Intimate partner violence    Fear of current or ex partner: Not on file    Emotionally abused: Not on file    Physically abused: Not on file    Forced sexual activity: Not on file  Other Topics Concern  . Not on file  Social History Narrative  . Not on file      Review of Systems  Constitutional: Negative for activity change, chills, fatigue and unexpected weight change.       Weight stable since her last visit   HENT: Negative for congestion, postnasal drip, rhinorrhea, sneezing and sore throat.   Respiratory: Negative for cough, chest tightness, shortness of breath and wheezing.   Cardiovascular: Negative for chest pain and palpitations.  Gastrointestinal: Negative for abdominal pain, constipation, diarrhea, nausea and vomiting.  Endocrine: Negative for cold intolerance, heat intolerance, polydipsia and polyuria.  Musculoskeletal: Negative for arthralgias, back pain, joint swelling and neck pain.  Skin: Negative for rash.  Allergic/Immunologic: Positive for environmental allergies.  Neurological: Negative for dizziness, tremors, numbness and headaches.  Hematological: Negative for adenopathy. Does not bruise/bleed easily.  Psychiatric/Behavioral: Negative for behavioral problems (Depression), sleep disturbance and suicidal ideas. The patient is not nervous/anxious.     Today's Vitals   12/12/18 1251  BP: (!) 132/94  Pulse: 94  Resp: 16  Temp: (!) 97.4 F (36.3 C)  SpO2: 99%  Weight: 172 lb 12.8 oz (78.4 kg)  Height: 5\' 3"  (1.6 m)   Body mass index is 30.61 kg/m.  Physical Exam Vitals signs  and nursing note reviewed.  Constitutional:      General: She is not in acute distress.    Appearance: Normal appearance. She is well-developed. She is not diaphoretic.  HENT:     Head: Normocephalic and atraumatic.     Right Ear: Tympanic membrane is not erythematous or bulging.     Left Ear: Tympanic membrane is not erythematous or bulging.     Nose: Nose normal.     Right Sinus: No maxillary sinus tenderness or frontal sinus tenderness.     Left Sinus: No maxillary sinus tenderness or frontal sinus tenderness.     Mouth/Throat:     Pharynx: No  oropharyngeal exudate or posterior oropharyngeal erythema.  Eyes:     Conjunctiva/sclera: Conjunctivae normal.     Pupils: Pupils are equal, round, and reactive to light.  Neck:     Musculoskeletal: Normal range of motion and neck supple.     Thyroid: No thyromegaly.     Vascular: No JVD.     Trachea: No tracheal deviation.  Cardiovascular:     Rate and Rhythm: Normal rate and regular rhythm.     Heart sounds: Normal heart sounds. No murmur. No friction rub. No gallop.   Pulmonary:     Effort: Pulmonary effort is normal. No respiratory distress.     Breath sounds: Normal breath sounds. No wheezing or rales.  Chest:     Chest wall: No tenderness.  Abdominal:     Palpations: Abdomen is soft.  Musculoskeletal: Normal range of motion.  Lymphadenopathy:     Cervical: No cervical adenopathy.  Skin:    General: Skin is warm and dry.  Neurological:     Mental Status: She is alert and oriented to person, place, and time.     Cranial Nerves: No cranial nerve deficit.  Psychiatric:        Behavior: Behavior normal.        Thought Content: Thought content normal.        Judgment: Judgment normal.   Assessment/Plan: 1. Essential hypertension Stable. Continue bp medication as prescribed   2. Chronic allergic rhinitis Well managed. Continue bp medication as prescribed.   3. Overweight Continue phentermine 37.5mg  tablets for additional 30  day prescription. Advised her to wean off medication and will take "holiday" from this medication. Continue to follow low calorie diet and participate in routine exercise.   General Counseling: mariangely sawinski understanding of the findings of todays visit and agrees with plan of treatment. I have discussed any further diagnostic evaluation that may be needed or ordered today. We also reviewed her medications today. she has been encouraged to call the office with any questions or concerns that should arise related to todays visit.   There is a liability release in patients' chart. There has been a 10 minute discussion about the side effects including but not limited to elevated blood pressure, anxiety, lack of sleep and dry mouth. Pt understands and will like to start/continue on appetite suppressant at this time. There will be one month RX given at the time of visit with proper follow up. Nova diet plan with restricted calories is given to the pt. Pt understands and agrees with  plan of treatment  This patient was seen by Leretha Pol FNP Collaboration with Dr Lavera Guise as a part of collaborative care agreement   Time spent: 46 Minutes      Dr Lavera Guise Internal medicine

## 2019-01-08 ENCOUNTER — Other Ambulatory Visit: Payer: Self-pay

## 2019-01-08 DIAGNOSIS — J309 Allergic rhinitis, unspecified: Secondary | ICD-10-CM

## 2019-01-08 MED ORDER — ROSUVASTATIN CALCIUM 5 MG PO TABS: 5.0000 mg | ORAL_TABLET | Freq: Every day | ORAL | 1 refills | Status: DC

## 2019-03-12 ENCOUNTER — Telehealth: Payer: Self-pay

## 2019-03-12 NOTE — Telephone Encounter (Signed)
LMOM FOR PATIENT TO CONFIRM AND SCREEN FOR 03-14-19 OV.

## 2019-03-14 ENCOUNTER — Ambulatory Visit: Payer: 59 | Admitting: Nurse Practitioner

## 2019-03-21 ENCOUNTER — Telehealth: Payer: Self-pay

## 2019-03-21 NOTE — Telephone Encounter (Signed)
Confirmed 03-25-19 ov as virtual.

## 2019-03-25 ENCOUNTER — Encounter: Payer: Self-pay | Admitting: Nurse Practitioner

## 2019-03-25 ENCOUNTER — Ambulatory Visit: Payer: 59 | Admitting: Nurse Practitioner

## 2019-03-25 VITALS — Ht 63.5 in

## 2019-03-25 DIAGNOSIS — E782 Mixed hyperlipidemia: Secondary | ICD-10-CM | POA: Diagnosis not present

## 2019-03-25 DIAGNOSIS — J309 Allergic rhinitis, unspecified: Secondary | ICD-10-CM

## 2019-03-25 DIAGNOSIS — I10 Essential (primary) hypertension: Secondary | ICD-10-CM | POA: Diagnosis not present

## 2019-03-25 DIAGNOSIS — E559 Vitamin D deficiency, unspecified: Secondary | ICD-10-CM | POA: Diagnosis not present

## 2019-03-25 DIAGNOSIS — Z803 Family history of malignant neoplasm of breast: Secondary | ICD-10-CM

## 2019-03-25 DIAGNOSIS — Z1231 Encounter for screening mammogram for malignant neoplasm of breast: Secondary | ICD-10-CM

## 2019-03-25 DIAGNOSIS — Z0001 Encounter for general adult medical examination with abnormal findings: Secondary | ICD-10-CM

## 2019-03-25 NOTE — Progress Notes (Signed)
[facility] [place], [place] [address]  Internal MEDICINE  Telephone Visit  Patient Name: Rhonda Sanders  [id number]  [id number]  Date of Service: 03/28/2019  I connected with the patient at 4:30pm by telephone and verified the patients identity using two identifiers.   I discussed the limitations, risks, security and privacy concerns of performing an evaluation and management service by telephone and the availability of in person appointments. I also discussed with the patient that there may be a patient responsible charge related to the service.  The patient expressed understanding and agrees to proceed.    Chief Complaint  Patient presents with  . Telephone Assessment  . Telephone Screen  . Hypertension  . Hyperlipidemia    The patient has been contacted via telephone for follow up visit due to concerns for spread of novel coronavirus. The patient presents for follow up visit. Her blood pressure has been well controlled and her allergies have been doing well. Has not had flares or sinus infections in past few months. She has no new concerns or complaints. She is due soon for health maintenance exam. Will need to have routine,, fasting labs done prior to this visit. She is also due to have her mammogram. Does get bilateral diagnostic mammogram as she has a very strong history of breast cancer.       Current Medication: Outpatient Encounter Medications as of 03/25/2019  Medication Sig  . cetirizine (ZYRTEC) 10 MG tablet Take 1 tablet (10 mg total) by mouth daily.  . fluticasone (FLONASE) 50 MCG/ACT nasal spray Place 2 sprays into both nostrils daily.  . hydrochlorothiazide (HYDRODIURIL) 25 MG tablet Take 1 tablet (25 mg total) by mouth daily.  Marland Kitchen lisinopril (ZESTRIL) 30 MG tablet Take 1 tablet (30 mg total) by mouth daily.  . Magnesium 500 MG TABS Take by mouth daily.  . montelukast (SINGULAIR) 10 MG tablet Take 1 tablet (10 mg total) by mouth at bedtime.  .  Multiple Vitamin (MULTIVITAMIN) tablet Take 1 tablet by mouth daily.  . phentermine (ADIPEX-P) 37.5 MG tablet Take 1 tablet (37.5 mg total) by mouth daily before breakfast.  . rosuvastatin (CRESTOR) 5 MG tablet Take 1 tablet (5 mg total) by mouth daily.  . valACYclovir (VALTREX) 1000 MG tablet Take 1 tab po twice a day for 5 days prn for fever blister   No facility-administered encounter medications on file as of 03/25/2019.    Surgical History: Past Surgical History:  Procedure Laterality Date  . ABDOMINAL HYSTERECTOMY  04/2006   partial  . BREAST BIOPSY Right 2014   CORE W/CLIP - NEG  . BREAST BIOPSY Right 2015   CORE W/OUT CLIP - NEG COLUMNAR CELL CHANGE WITH MICRO CALCIFICATIONS.   Marland Kitchen BREAST EXCISIONAL BIOPSY Right 2010   EXCISIONAL -ALH, columnar cell hyperplasia  . COLONOSCOPY WITH PROPOFOL N/A 05/26/2015   Procedure: COLONOSCOPY WITH PROPOFOL;  Surgeon: Robert Bellow, MD;  Location: [facility] ENDOSCOPY;  Service: Endoscopy;  Laterality: N/A;    Medical History: Past Medical History:  Diagnosis Date  . Allergy    seasonal   . Atypical lobular hyperplasia of right breast 12/25/2008   Sngle focus noted in area of columnar cell hyperplasia  . Hypercholesterolemia   . Hypertension   . PONV (postoperative nausea and vomiting)     Family History: Family History  Problem Relation Age of Onset  . Breast cancer Mother 49  . Heart disease Father        first MI (  30s), died age 62 - MI  . Heart disease Paternal Grandfather   . Heart disease Paternal Uncle   . CVA Paternal Grandmother   . Breast cancer Maternal Aunt 73  . Crohn's disease Brother   . Stomach cancer Maternal Grandfather   . Colon cancer Neg Hx     Social History   Socioeconomic History  . Marital status: Single    Spouse name: Not on file  . Number of children: 0  . Years of education: Not on file  . Highest education level: Not on file  Occupational History  . Not on file  Tobacco Use  . Smoking  status: Never Smoker  . Smokeless tobacco: Never Used  Substance and Sexual Activity  . Alcohol use: Yes    Alcohol/week: 0.0 standard drinks    Comment: occasionally  . Drug use: No  . Sexual activity: Not on file  Other Topics Concern  . Not on file  Social History Narrative  . Not on file   Social Determinants of Health   Financial Resource Strain:   . Difficulty of Paying Living Expenses: Not on file  Food Insecurity:   . Worried About Charity fundraiser in the Last Year: Not on file  . Ran Out of Food in the Last Year: Not on file  Transportation Needs:   . Lack of Transportation (Medical): Not on file  . Lack of Transportation (Non-Medical): Not on file  Physical Activity:   . Days of Exercise per Week: Not on file  . Minutes of Exercise per Session: Not on file  Stress:   . Feeling of Stress : Not on file  Social Connections:   . Frequency of Communication with Friends and Family: Not on file  . Frequency of Social Gatherings with Friends and Family: Not on file  . Attends Religious Services: Not on file  . Active Member of Clubs or Organizations: Not on file  . Attends Archivist Meetings: Not on file  . Marital Status: Not on file  Intimate Partner Violence:   . Fear of Current or Ex-Partner: Not on file  . Emotionally Abused: Not on file  . Physically Abused: Not on file  . Sexually Abused: Not on file      Review of Systems  Constitutional: Negative for activity change, chills, fatigue and unexpected weight change.       Weight stable since her last visit   HENT: Negative for congestion, postnasal drip, rhinorrhea, sneezing and sore throat.   Respiratory: Negative for cough, chest tightness, shortness of breath and wheezing.   Cardiovascular: Negative for chest pain and palpitations.  Gastrointestinal: Negative for abdominal pain, constipation, diarrhea, nausea and vomiting.  Endocrine: Negative for cold intolerance, heat intolerance,  polydipsia and polyuria.  Musculoskeletal: Negative for arthralgias, back pain, joint swelling and neck pain.  Skin: Negative for rash.  Allergic/Immunologic: Positive for environmental allergies.  Neurological: Negative for dizziness, tremors, numbness and headaches.  Hematological: Negative for adenopathy. Does not bruise/bleed easily.  Psychiatric/Behavioral: Negative for behavioral problems (Depression), sleep disturbance and suicidal ideas. The patient is not nervous/anxious.     Today's Vitals   03/25/19 1604  Height: 5' 3.5" (1.613 m)   Body mass index is 30.13 kg/m.  Observation/Objective:   The patient is alert and oriented. She is pleasant and answers all questions appropriately. Breathing is non-labored. She is in no acute distress at this time.    Assessment/Plan: 1. Essential hypertension Stable. Continue bp medication as  prescribed. Orders for routine, fasting labs placed in Epic.  - CBC with Differential/Platelet; Future - Comprehensive metabolic panel; Future - Comprehensive metabolic panel - CBC with Differential/Platelet  2. Mixed hyperlipidemia Continue crestor 5mg  daily. Check routine, fasting labs prior to next visit. Adjust medication as prescribed.  - CBC with Differential/Platelet; Future - Comprehensive metabolic panel; Future - T4, free; Future - TSH; Future - Lipid panel; Future - Lipid panel - TSH - T4, free - Comprehensive metabolic panel - CBC with Differential/Platelet  3. Vitamin D deficiency Check vitamin d level and treat if low.  - Vitamin D 1,25 dihydroxy; Future - Vitamin D 1,25 dihydroxy  4. Chronic allergic rhinitis Continue all allergy medication and nasal sprays as prescribed   5. Encounter for screening mammogram for malignant neoplasm of breast Bilateral diagnostic mammogram ordered  - MM DIAG BREAST TOMO BILATERAL; Future  6. Family history of breast cancer in mother .Bilateral diagnostic mammogram ordered  - MM DIAG  BREAST TOMO BILATERAL; Future   General Counseling: abeni bowes understanding of the findings of today's phone visit and agrees with plan of treatment. I have discussed any further diagnostic evaluation that may be needed or ordered today. We also reviewed her medications today. she has been encouraged to call the office with any questions or concerns that should arise related to todays visit.  Hypertension Counseling:   The following hypertensive lifestyle modification were recommended and discussed:  1. Limiting alcohol intake to less than 1 oz/day of ethanol:(24 oz of beer or 8 oz of wine or 2 oz of 100-proof whiskey). 2. Take baby ASA 81 mg daily. 3. Importance of regular aerobic exercise and losing weight. 4. Reduce dietary saturated fat and cholesterol intake for overall cardiovascular health. 5. Maintaining adequate dietary potassium, calcium, and magnesium intake. 6. Regular monitoring of the blood pressure. 7. Reduce sodium intake to less than 100 mmol/day (less than 2.3 gm of sodium or less than 6 gm of sodium choride)   This patient was seen by [place] with Dr Lavera Guise as a part of collaborative care agreement  Orders Placed This Encounter  Procedures  . MM DIAG BREAST TOMO BILATERAL  . CBC with Differential/Platelet  . Comprehensive metabolic panel  . T4, free  . TSH  . Lipid panel  . Vitamin D 1,25 dihydroxy     Time spent: 50 Minutes    Dr Lavera Guise Internal medicine

## 2019-03-26 ENCOUNTER — Other Ambulatory Visit: Payer: Self-pay | Admitting: Nurse Practitioner

## 2019-03-26 DIAGNOSIS — Z803 Family history of malignant neoplasm of breast: Secondary | ICD-10-CM

## 2019-03-26 DIAGNOSIS — Z1231 Encounter for screening mammogram for malignant neoplasm of breast: Secondary | ICD-10-CM

## 2019-03-28 DIAGNOSIS — Z1231 Encounter for screening mammogram for malignant neoplasm of breast: Secondary | ICD-10-CM | POA: Insufficient documentation

## 2019-03-28 DIAGNOSIS — Z0001 Encounter for general adult medical examination with abnormal findings: Secondary | ICD-10-CM | POA: Insufficient documentation

## 2019-03-28 DIAGNOSIS — Z803 Family history of malignant neoplasm of breast: Secondary | ICD-10-CM | POA: Insufficient documentation

## 2019-04-11 ENCOUNTER — Ambulatory Visit
Admission: RE | Admit: 2019-04-11 | Discharge: 2019-04-11 | Disposition: A | Discharge status: Home / Self Care | Payer: 59 | Source: Ambulatory Visit | Attending: Nurse Practitioner | Admitting: Nurse Practitioner

## 2019-04-11 DIAGNOSIS — Z803 Family history of malignant neoplasm of breast: Secondary | ICD-10-CM

## 2019-04-11 DIAGNOSIS — Z1231 Encounter for screening mammogram for malignant neoplasm of breast: Secondary | ICD-10-CM

## 2019-04-13 NOTE — Progress Notes (Signed)
Benign breast imaging.

## 2019-04-13 NOTE — Progress Notes (Signed)
Benign mammogram and ultrasound results.

## 2019-04-18 ENCOUNTER — Telehealth: Payer: Self-pay

## 2019-04-18 NOTE — Telephone Encounter (Signed)
CONFIRMED AND SCREENED FOR 04-22-19 OV.

## 2019-04-22 ENCOUNTER — Other Ambulatory Visit: Payer: Self-pay

## 2019-04-22 ENCOUNTER — Ambulatory Visit (INDEPENDENT_AMBULATORY_CARE_PROVIDER_SITE_OTHER): Payer: 59 | Admitting: Nurse Practitioner

## 2019-04-22 ENCOUNTER — Encounter: Payer: Self-pay | Admitting: Nurse Practitioner

## 2019-04-22 VITALS — BP 142/80 | HR 94 | Temp 97.7°F | Resp 16 | Ht 63.5 in | Wt 183.6 lb

## 2019-04-22 DIAGNOSIS — E663 Overweight: Secondary | ICD-10-CM

## 2019-04-22 DIAGNOSIS — Z0001 Encounter for general adult medical examination with abnormal findings: Secondary | ICD-10-CM

## 2019-04-22 DIAGNOSIS — I10 Essential (primary) hypertension: Secondary | ICD-10-CM | POA: Diagnosis not present

## 2019-04-22 DIAGNOSIS — E782 Mixed hyperlipidemia: Secondary | ICD-10-CM | POA: Diagnosis not present

## 2019-04-22 DIAGNOSIS — J309 Allergic rhinitis, unspecified: Secondary | ICD-10-CM | POA: Diagnosis not present

## 2019-04-22 DIAGNOSIS — J301 Allergic rhinitis due to pollen: Secondary | ICD-10-CM

## 2019-04-22 DIAGNOSIS — R300 Dysuria: Secondary | ICD-10-CM

## 2019-04-22 MED ORDER — LISINOPRIL 30 MG PO TABS: 30.0000 mg | ORAL_TABLET | Freq: Every day | ORAL | 3 refills | Status: DC

## 2019-04-22 MED ORDER — ROSUVASTATIN CALCIUM 5 MG PO TABS: 5.0000 mg | ORAL_TABLET | Freq: Every day | ORAL | 1 refills | Status: DC

## 2019-04-22 MED ORDER — MONTELUKAST SODIUM 10 MG PO TABS: 10.0000 mg | ORAL_TABLET | Freq: Every day | ORAL | 3 refills | Status: DC

## 2019-04-22 MED ORDER — FLUTICASONE PROPIONATE 50 MCG/ACT NA SUSP: 2.0000 | Freq: Every day | NASAL | 3 refills | Status: DC

## 2019-04-22 MED ORDER — PHENTERMINE HCL 37.5 MG PO TABS: 37.5000 mg | ORAL_TABLET | Freq: Every day | ORAL | 0 refills | Status: DC

## 2019-04-22 MED ORDER — HYDROCHLOROTHIAZIDE 25 MG PO TABS: 25.0000 mg | ORAL_TABLET | Freq: Every day | ORAL | 3 refills | Status: DC

## 2019-04-22 MED ORDER — CETIRIZINE HCL 10 MG PO TABS: 10.0000 mg | ORAL_TABLET | Freq: Every day | ORAL | 3 refills | Status: DC

## 2019-04-22 NOTE — Progress Notes (Signed)
[facility] [place], [place] [address]  Internal MEDICINE  Office Visit Note  Patient Name: Rhonda Sanders  [id number]  [id number]  Date of Service: 04/27/2019  Chief Complaint  Patient presents with  . Annual Exam  . Hypertension  . Hyperlipidemia     The patient is here for annual wellness visit. She recently had routine labs done. Triglyceride levels were slightly elevated and slightly improved from the most recent check last year. She increased her crestor to everyday. She does have moderate joint pain and muscle aches with increased dosing. Makes exercising more difficult. Blood pressure is mildly elevated. She states that upon arrival, she had to weight and had 11 pound weight gain since her last, in-office visit. She has been off of appetite suppressant. Taking a break to let metabolism kick in.  She also had screening mammogram with diagnostic images of the right breast. This showed decreased size of cystic mass of right breast. Other images are benign. She does not have to repeat study for one year.   Pt is here for routine health maintenance examination  Current Medication: Outpatient Encounter Medications as of 04/22/2019  Medication Sig  . cetirizine (ZYRTEC) 10 MG tablet Take 1 tablet (10 mg total) by mouth daily.  . fluticasone (FLONASE) 50 MCG/ACT nasal spray Place 2 sprays into both nostrils daily.  . hydrochlorothiazide (HYDRODIURIL) 25 MG tablet Take 1 tablet (25 mg total) by mouth daily.  Marland Kitchen lisinopril (ZESTRIL) 30 MG tablet Take 1 tablet (30 mg total) by mouth daily.  . Magnesium 500 MG TABS Take by mouth daily.  . montelukast (SINGULAIR) 10 MG tablet Take 1 tablet (10 mg total) by mouth at bedtime.  . Multiple Vitamin (MULTIVITAMIN) tablet Take 1 tablet by mouth daily.  . rosuvastatin (CRESTOR) 5 MG tablet Take 1 tablet (5 mg total) by mouth daily.  . valACYclovir (VALTREX) 1000 MG tablet Take 1 tab po twice a day for 5 days prn for fever  blister  . [DISCONTINUED] cetirizine (ZYRTEC) 10 MG tablet Take 1 tablet (10 mg total) by mouth daily.  . [DISCONTINUED] fluticasone (FLONASE) 50 MCG/ACT nasal spray Place 2 sprays into both nostrils daily.  . [DISCONTINUED] hydrochlorothiazide (HYDRODIURIL) 25 MG tablet Take 1 tablet (25 mg total) by mouth daily.  . [DISCONTINUED] lisinopril (ZESTRIL) 30 MG tablet Take 1 tablet (30 mg total) by mouth daily.  . [DISCONTINUED] montelukast (SINGULAIR) 10 MG tablet Take 1 tablet (10 mg total) by mouth at bedtime.  . [DISCONTINUED] rosuvastatin (CRESTOR) 5 MG tablet Take 1 tablet (5 mg total) by mouth daily.  . phentermine (ADIPEX-P) 37.5 MG tablet Take 1 tablet (37.5 mg total) by mouth daily before breakfast.  . [DISCONTINUED] phentermine (ADIPEX-P) 37.5 MG tablet Take 1 tablet (37.5 mg total) by mouth daily before breakfast. (Patient not taking: Reported on 04/22/2019)   No facility-administered encounter medications on file as of 04/22/2019.    Surgical History: Past Surgical History:  Procedure Laterality Date  . ABDOMINAL HYSTERECTOMY  04/2006   partial  . BREAST BIOPSY Right 2014   CORE W/CLIP - NEG  . BREAST BIOPSY Right 2015   CORE W/OUT CLIP - NEG COLUMNAR CELL CHANGE WITH MICRO CALCIFICATIONS.   Marland Kitchen BREAST EXCISIONAL BIOPSY Right 2010   EXCISIONAL -ALH, columnar cell hyperplasia  . COLONOSCOPY WITH PROPOFOL N/A 05/26/2015   Procedure: COLONOSCOPY WITH PROPOFOL;  Surgeon: Robert Bellow, MD;  Location: [facility] ENDOSCOPY;  Service: Endoscopy;  Laterality: N/A;    Medical History:  Past Medical History:  Diagnosis Date  . Allergy    seasonal   . Atypical lobular hyperplasia of right breast 12/25/2008   Sngle focus noted in area of columnar cell hyperplasia  . Hypercholesterolemia   . Hypertension   . PONV (postoperative nausea and vomiting)     Family History: Family History  Problem Relation Age of Onset  . Breast cancer Mother 60  . Heart disease Father        first MI  (46s), died age 67 - MI  . Heart disease Paternal Grandfather   . Heart disease Paternal Uncle   . CVA Paternal Grandmother   . Breast cancer Maternal Aunt 3  . Crohn's disease Brother   . Stomach cancer Maternal Grandfather   . Colon cancer Neg Hx       Review of Systems  Constitutional: Negative for activity change, chills, fatigue and unexpected weight change.       Weight gain of 11 pounds since her most recent, in-office visit.   HENT: Negative for congestion, postnasal drip, rhinorrhea, sneezing and sore throat.   Respiratory: Negative for cough, chest tightness, shortness of breath and wheezing.   Cardiovascular: Negative for chest pain and palpitations.  Gastrointestinal: Negative for abdominal pain, constipation, diarrhea, nausea and vomiting.  Endocrine: Negative for cold intolerance, heat intolerance, polydipsia and polyuria.  Musculoskeletal: Negative for arthralgias, back pain, joint swelling and neck pain.  Skin: Negative for rash.  Allergic/Immunologic: Positive for environmental allergies.  Neurological: Negative for dizziness, tremors, numbness and headaches.  Hematological: Negative for adenopathy. Does not bruise/bleed easily.  Psychiatric/Behavioral: Negative for behavioral problems (Depression), sleep disturbance and suicidal ideas. The patient is not nervous/anxious.      Today's Vitals   04/22/19 1456  BP: (!) 142/80  Pulse: 94  Resp: 16  Temp: 97.7 F (36.5 C)  SpO2: 98%  Weight: 183 lb 9.6 oz (83.3 kg)  Height: 5' 3.5" (1.613 m)   Body mass index is 32.01 kg/m.  Physical Exam Vitals and nursing note reviewed.  Constitutional:      General: She is not in acute distress.    Appearance: Normal appearance. She is well-developed. She is not diaphoretic.  HENT:     Head: Normocephalic and atraumatic.     Nose: Nose normal.     Mouth/Throat:     Pharynx: No oropharyngeal exudate.  Eyes:     Pupils: Pupils are equal, round, and reactive to  light.  Neck:     Thyroid: No thyromegaly.     Vascular: No carotid bruit or JVD.     Trachea: No tracheal deviation.  Cardiovascular:     Rate and Rhythm: Normal rate and regular rhythm.     Pulses: Normal pulses.     Heart sounds: Normal heart sounds. No murmur. No friction rub. No gallop.   Pulmonary:     Effort: Pulmonary effort is normal. No respiratory distress.     Breath sounds: Normal breath sounds. No wheezing or rales.  Chest:     Chest wall: No tenderness.     Breasts:        Right: Normal. No swelling, bleeding, inverted nipple, mass, nipple discharge, skin change or tenderness.        Left: Normal. No swelling, bleeding, inverted nipple, mass, nipple discharge, skin change or tenderness.  Abdominal:     General: Bowel sounds are normal.     Palpations: Abdomen is soft.     Tenderness: There is no abdominal tenderness.  Musculoskeletal:        General: Normal range of motion.     Cervical back: Normal range of motion and neck supple.  Lymphadenopathy:     Cervical: No cervical adenopathy.     Upper Body:     Right upper body: No axillary adenopathy.     Left upper body: No axillary adenopathy.  Skin:    General: Skin is warm and dry.  Neurological:     Mental Status: She is alert and oriented to person, place, and time.     Cranial Nerves: No cranial nerve deficit.  Psychiatric:        Mood and Affect: Mood normal.        Behavior: Behavior normal.        Thought Content: Thought content normal.        Judgment: Judgment normal.      LABS: Recent Results (from the past 2160 hour(s))  Vitamin D 1,25 dihydroxy     Status: None   Collection Time: 04/15/19  9:26 AM  Result Value Ref Range   Vitamin D 1, 25 (OH)2 Total 25 pg/mL    Comment: Reference Range: Adults: 21 - 65    Vitamin D2 1, 25 (OH)2 <10 pg/mL    Comment: This test was developed and its performance characteristics determined by [facility]. It has not been cleared or approved by the Food and  Drug Administration.    Vitamin D3 1, 25 (OH)2 25 pg/mL    Comment: This test was developed and its performance characteristics determined by [facility]. It has not been cleared or approved by the Food and Drug Administration.   Lipid panel     Status: Abnormal   Collection Time: 04/15/19  9:26 AM  Result Value Ref Range   Cholesterol, Total 176 100 - 199 mg/dL   Triglycerides 197 (H) 0 - 149 mg/dL   HDL 44 >39 mg/dL   VLDL Cholesterol Cal 34 5 - 40 mg/dL   LDL Chol Calc (NIH) 98 0 - 99 mg/dL   Chol/HDL Ratio 4.0 0.0 - 4.4 ratio    Comment:                                   T. Chol/HDL Ratio                                             Men  Women                               1/2 Avg.Risk  3.4    3.3                                   Avg.Risk  5.0    4.4                                2X Avg.Risk  9.6    7.1                                3X Avg.Risk 23.4  11.0   TSH     Status: None   Collection Time: 04/15/19  9:26 AM  Result Value Ref Range   TSH 1.600 0.450 - 4.500 uIU/mL  T4, free     Status: None   Collection Time: 04/15/19  9:26 AM  Result Value Ref Range   Free T4 1.24 0.82 - 1.77 ng/dL  Comprehensive metabolic panel     Status: Abnormal   Collection Time: 04/15/19  9:26 AM  Result Value Ref Range   Glucose 108 (H) 65 - 99 mg/dL   BUN 17 6 - 24 mg/dL   Creatinine, Ser 0.87 0.57 - 1.00 mg/dL   GFR calc non Af Amer 75 >59 mL/min/1.73   GFR calc Af Amer 86 >59 mL/min/1.73   BUN/Creatinine Ratio 20 9 - 23   Sodium 140 134 - 144 mmol/L   Potassium 4.3 3.5 - 5.2 mmol/L   Chloride 101 96 - 106 mmol/L   CO2 23 20 - 29 mmol/L   Calcium 10.2 8.7 - 10.2 mg/dL   Total Protein 7.0 6.0 - 8.5 g/dL   Albumin 4.6 3.8 - 4.9 g/dL   Globulin, Total 2.4 1.5 - 4.5 g/dL   Albumin/Globulin Ratio 1.9 1.2 - 2.2   Bilirubin Total 0.5 0.0 - 1.2 mg/dL   Alkaline Phosphatase 101 39 - 117 IU/L   AST 33 0 - 40 IU/L   ALT 34 (H) 0 - 32 IU/L  CBC with Differential/Platelet     Status:  Abnormal   Collection Time: 04/15/19  9:26 AM  Result Value Ref Range   WBC 9.5 3.4 - 10.8 x10E3/uL   RBC 4.81 3.77 - 5.28 x10E6/uL   Hemoglobin 14.6 11.1 - 15.9 g/dL   Hematocrit 43.0 34.0 - 46.6 %   MCV 89 79 - 97 fL   MCH 30.4 26.6 - 33.0 pg   MCHC 34.0 31.5 - 35.7 g/dL   RDW 13.0 11.7 - 15.4 %   Platelets 395 150 - 450 x10E3/uL   Neutrophils 55 Not Estab. %   Lymphs 35 Not Estab. %   Monocytes 8 Not Estab. %   Eos 1 Not Estab. %   Basos 1 Not Estab. %   Neutrophils Absolute 5.3 1.4 - 7.0 x10E3/uL   Lymphocytes Absolute 3.3 (H) 0.7 - 3.1 x10E3/uL   Monocytes Absolute 0.7 0.1 - 0.9 x10E3/uL   EOS (ABSOLUTE) 0.1 0.0 - 0.4 x10E3/uL   Basophils Absolute 0.1 0.0 - 0.2 x10E3/uL   Immature Granulocytes 0 Not Estab. %   Immature Grans (Abs) 0.0 0.0 - 0.1 x10E3/uL  UA/M w/rflx Culture, Routine     Status: None   Collection Time: 04/22/19 12:00 AM   Specimen: Urine   URINE  Result Value Ref Range   Specific Gravity, UA 1.014 1.005 - 1.030   pH, UA 6.0 5.0 - 7.5   Color, UA Yellow Yellow   Appearance Ur Clear Clear   Leukocytes,UA Negative Negative   Protein,UA Negative Negative/Trace   Glucose, UA Negative Negative   Ketones, UA Negative Negative   RBC, UA Negative Negative   Bilirubin, UA Negative Negative   Urobilinogen, Ur 0.2 0.2 - 1.0 mg/dL   Nitrite, UA Negative Negative   Microscopic Examination Comment     Comment: Microscopic follows if indicated.   Microscopic Examination See below:     Comment: Microscopic was indicated and was performed.   Urinalysis Reflex Comment     Comment: This specimen will not reflex to a Urine Culture.  Microscopic Examination     Status: None   Collection Time: 04/22/19 12:00 AM   URINE  Result Value Ref Range   WBC, UA 0-5 0 - 5 /hpf   RBC 0-2 0 - 2 /hpf   Epithelial Cells (non renal) 0-10 0 - 10 /hpf   Casts None seen None seen /lpf   Mucus, UA Present Not Estab.   Bacteria, UA None seen None seen/Few   Assessment/Plan: 1.  Encounter for general adult medical examination with abnormal findings Annual health maintenance exam today.   2. Essential hypertension Blood pressure stable. Continue medication as prescribed. Refills provided today.  - hydrochlorothiazide (HYDRODIURIL) 25 MG tablet; Take 1 tablet (25 mg total) by mouth daily.  Dispense: 90 tablet; Refill: 3 - lisinopril (ZESTRIL) 30 MG tablet; Take 1 tablet (30 mg total) by mouth daily.  Dispense: 90 tablet; Refill: 3  3. Mixed hyperlipidemia Most recent cholesterol panel. Continue crestor as prescribed.  - rosuvastatin (CRESTOR) 5 MG tablet; Take 1 tablet (5 mg total) by mouth daily.  Dispense: 90 tablet; Refill: 1  4. Chronic allergic rhinitis Doing well. Continue singulair and zyrtec as prescribed. Refills provided today.  - montelukast (SINGULAIR) 10 MG tablet; Take 1 tablet (10 mg total) by mouth at bedtime.  Dispense: 90 tablet; Refill: 3 - cetirizine (ZYRTEC) 10 MG tablet; Take 1 tablet (10 mg total) by mouth daily.  Dispense: 90 tablet; Refill: 3  5. Non-seasonal allergic rhinitis due to pollen Continue flonase as prescribed. Refills provided today.  - fluticasone (FLONASE) 50 MCG/ACT nasal spray; Place 2 sprays into both nostrils daily.  Dispense: 48 g; Refill: 3  6. Overweight Restart phentermine 37.5mg  tablets daily. Limit calorie intake to 1200-1500 calories per day. Incorporate exercise into daily routine.  - phentermine (ADIPEX-P) 37.5 MG tablet; Take 1 tablet (37.5 mg total) by mouth daily before breakfast.  Dispense: 30 tablet; Refill: 0  7. Dysuria - UA/M w/rflx Culture, Routine  General Counseling: madai [email or web address] understanding of the findings of todays visit and agrees with plan of treatment. I have discussed any further diagnostic evaluation that may be needed or ordered today. We also reviewed her medications today. she has been encouraged to call the office with any questions or concerns that should arise related to todays  visit.    Counseling:   There is a liability release in patients' chart. There has been a 10 minute discussion about the side effects including but not limited to elevated blood pressure, anxiety, lack of sleep and dry mouth. Pt understands and will like to start/continue on appetite suppressant at this time. There will be one month RX given at the time of visit with proper follow up. Nova diet plan with restricted calories is given to the pt. Pt understands and agrees with  plan of treatment  This patient was seen by Leretha Pol FNP Collaboration with Dr Lavera Guise as a part of collaborative care agreement  Orders Placed This Encounter  Procedures  . Microscopic Examination  . UA/M w/rflx Culture, Routine    Meds ordered this encounter  Medications  . phentermine (ADIPEX-P) 37.5 MG tablet    Sig: Take 1 tablet (37.5 mg total) by mouth daily before breakfast.    Dispense:  30 tablet    Refill:  0    Order Specific Question:   Supervising Provider    Answer:   Lavera Guise [place]  . fluticasone (FLONASE) 50 MCG/ACT nasal spray    Sig: Place 2 sprays  into both nostrils daily.    Dispense:  48 g    Refill:  3    Order Specific Question:   Supervising Provider    Answer:   Lavera Guise [id number]  . hydrochlorothiazide (HYDRODIURIL) 25 MG tablet    Sig: Take 1 tablet (25 mg total) by mouth daily.    Dispense:  90 tablet    Refill:  3    Order Specific Question:   Supervising Provider    Answer:   Lavera Guise [id number]  . lisinopril (ZESTRIL) 30 MG tablet    Sig: Take 1 tablet (30 mg total) by mouth daily.    Dispense:  90 tablet    Refill:  3    Order Specific Question:   Supervising Provider    Answer:   Lavera Guise [id number]  . montelukast (SINGULAIR) 10 MG tablet    Sig: Take 1 tablet (10 mg total) by mouth at bedtime.    Dispense:  90 tablet    Refill:  3    Order Specific Question:   Supervising Provider    Answer:   Lavera Guise [id number]  . rosuvastatin (CRESTOR)  5 MG tablet    Sig: Take 1 tablet (5 mg total) by mouth daily.    Dispense:  90 tablet    Refill:  1    Please fill as 90 day prescriptions if possible.    Order Specific Question:   Supervising Provider    Answer:   Lavera Guise [id number]  . cetirizine (ZYRTEC) 10 MG tablet    Sig: Take 1 tablet (10 mg total) by mouth daily.    Dispense:  90 tablet    Refill:  3    Order Specific Question:   Supervising Provider    Answer:   Lavera Guise [id number]    Total time spent: 9 Minutes  Time spent includes review of chart, medications, test results, and follow up plan with the patient.     Lavera Guise, MD  Internal Medicine

## 2019-04-23 LAB — UA/M W/RFLX CULTURE, ROUTINE
Bilirubin, UA: NEGATIVE
Glucose, UA: NEGATIVE
Ketones, UA: NEGATIVE
Leukocytes,UA: NEGATIVE
Nitrite, UA: NEGATIVE
Protein,UA: NEGATIVE
RBC, UA: NEGATIVE
Specific Gravity, UA: 1.014 (ref 1.005–1.030)
Urobilinogen, Ur: 0.2 mg/dL (ref 0.2–1.0)
pH, UA: 6 (ref 5.0–7.5)

## 2019-04-23 LAB — MICROSCOPIC EXAMINATION
Bacteria, UA: NONE SEEN
Casts: NONE SEEN /lpf

## 2019-04-24 LAB — COMPREHENSIVE METABOLIC PANEL
ALT: 34 IU/L — ABNORMAL HIGH (ref 0–32)
AST: 33 IU/L (ref 0–40)
Albumin/Globulin Ratio: 1.9 (ref 1.2–2.2)
Albumin: 4.6 g/dL (ref 3.8–4.9)
Alkaline Phosphatase: 101 IU/L (ref 39–117)
BUN/Creatinine Ratio: 20 (ref 9–23)
BUN: 17 mg/dL (ref 6–24)
Bilirubin Total: 0.5 mg/dL (ref 0.0–1.2)
CO2: 23 mmol/L (ref 20–29)
Calcium: 10.2 mg/dL (ref 8.7–10.2)
Chloride: 101 mmol/L (ref 96–106)
Creatinine, Ser: 0.87 mg/dL (ref 0.57–1.00)
GFR calc Af Amer: 86 mL/min/{1.73_m2} (ref >59)
GFR calc non Af Amer: 75 mL/min/{1.73_m2} (ref >59)
Globulin, Total: 2.4 g/dL (ref 1.5–4.5)
Glucose: 108 mg/dL — ABNORMAL HIGH (ref 65–99)
Potassium: 4.3 mmol/L (ref 3.5–5.2)
Sodium: 140 mmol/L (ref 134–144)
Total Protein: 7 g/dL (ref 6.0–8.5)

## 2019-04-24 LAB — CBC WITH DIFFERENTIAL/PLATELET
Basophils Absolute: 0.1 10*3/uL (ref 0.0–0.2)
Basos: 1 %
EOS (ABSOLUTE): 0.1 10*3/uL (ref 0.0–0.4)
Eos: 1 %
Hematocrit: 43 % (ref 34.0–46.6)
Hemoglobin: 14.6 g/dL (ref 11.1–15.9)
Immature Grans (Abs): 0 10*3/uL (ref 0.0–0.1)
Immature Granulocytes: 0 %
Lymphocytes Absolute: 3.3 10*3/uL — ABNORMAL HIGH (ref 0.7–3.1)
Lymphs: 35 %
MCH: 30.4 pg (ref 26.6–33.0)
MCHC: 34 g/dL (ref 31.5–35.7)
MCV: 89 fL (ref 79–97)
Monocytes Absolute: 0.7 10*3/uL (ref 0.1–0.9)
Monocytes: 8 %
Neutrophils Absolute: 5.3 10*3/uL (ref 1.4–7.0)
Neutrophils: 55 %
Platelets: 395 10*3/uL (ref 150–450)
RBC: 4.81 x10E6/uL (ref 3.77–5.28)
RDW: 13 % (ref 11.7–15.4)
WBC: 9.5 10*3/uL (ref 3.4–10.8)

## 2019-04-24 LAB — TSH: TSH: 1.6 u[IU]/mL (ref 0.450–4.500)

## 2019-04-24 LAB — T4, FREE: Free T4: 1.24 ng/dL (ref 0.82–1.77)

## 2019-04-24 LAB — VITAMIN D 1,25 DIHYDROXY
Vitamin D 1, 25 (OH)2 Total: 25 pg/mL
Vitamin D2 1, 25 (OH)2: <10 pg/mL
Vitamin D3 1, 25 (OH)2: 25 pg/mL

## 2019-04-24 LAB — LIPID PANEL
Chol/HDL Ratio: 4 ratio (ref 0.0–4.4)
Cholesterol, Total: 176 mg/dL (ref 100–199)
HDL: 44 mg/dL (ref >39)
LDL Chol Calc (NIH): 98 mg/dL (ref 0–99)
Triglycerides: 197 mg/dL — ABNORMAL HIGH (ref 0–149)
VLDL Cholesterol Cal: 34 mg/dL (ref 5–40)

## 2019-04-24 NOTE — Progress Notes (Signed)
Reviewed with patient during visit

## 2019-04-25 IMAGING — MG DIGITAL DIAGNOSTIC BILATERAL MAMMOGRAM WITH TOMO AND CAD
6 of 10 series · 6 of 30 positions shown · non-contrast
Comparison: Previous exam(s).

CLINICAL DATA: Follow-up for a probably benign right breast mass.

EXAM:
DIGITAL DIAGNOSTIC BILATERAL MAMMOGRAM WITH CAD AND TOMO
ULTRASOUND RIGHT BREAST

[L MLO synth-2D]
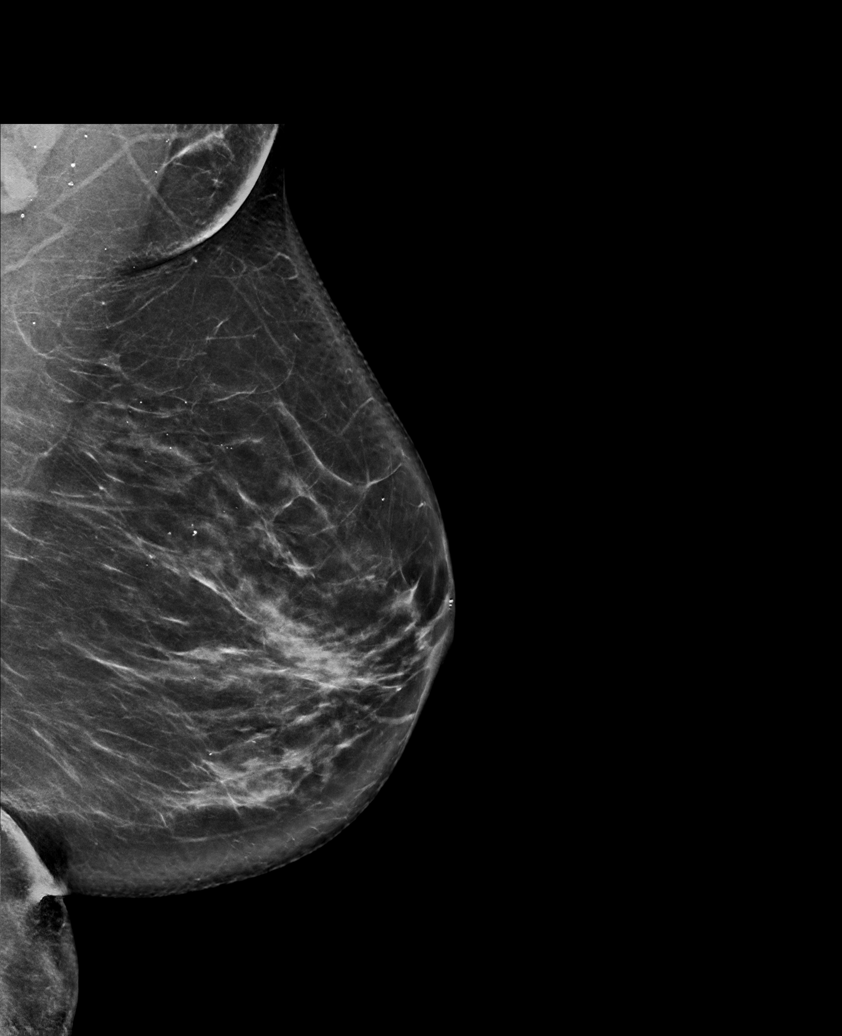

[R MLO synth-2D]
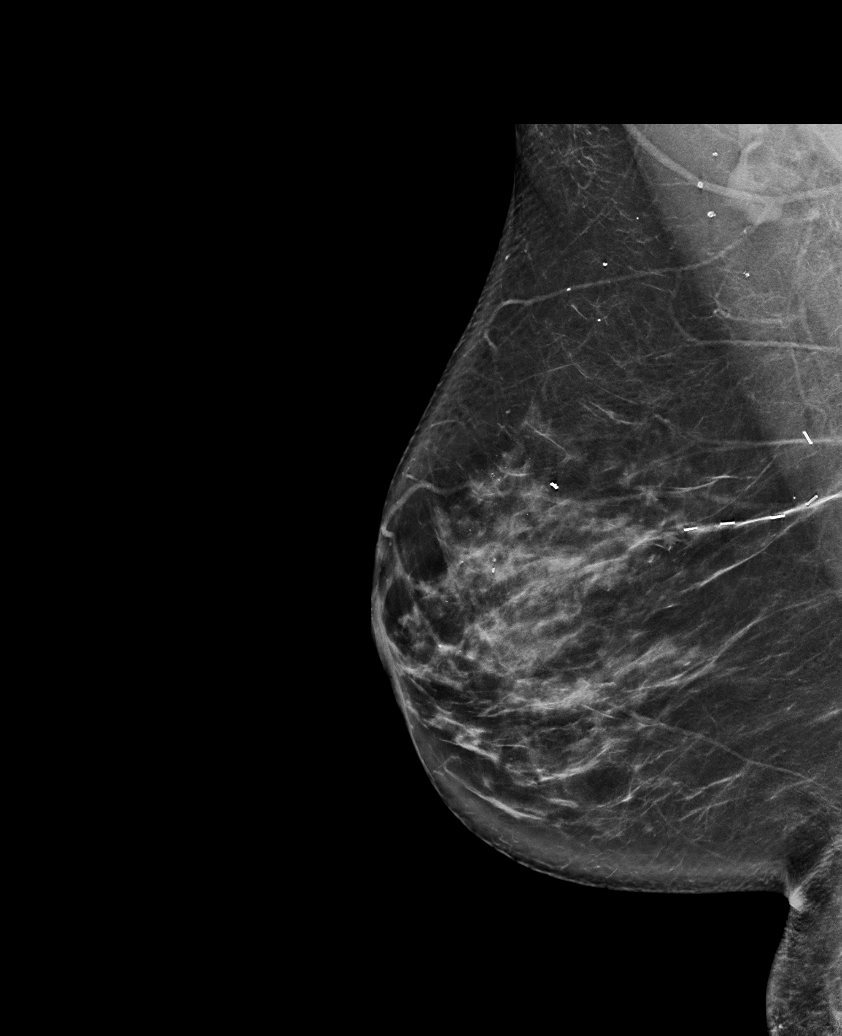

[L XCCL synth-2D]
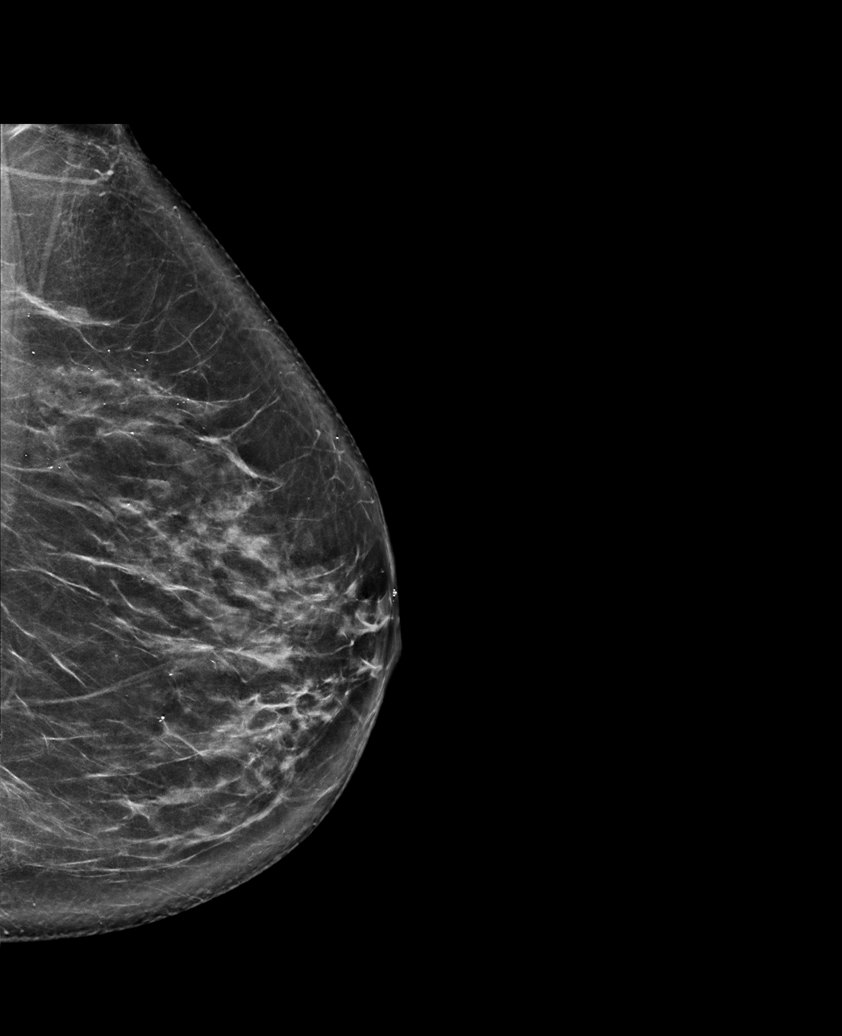

[R CC synth-2D]
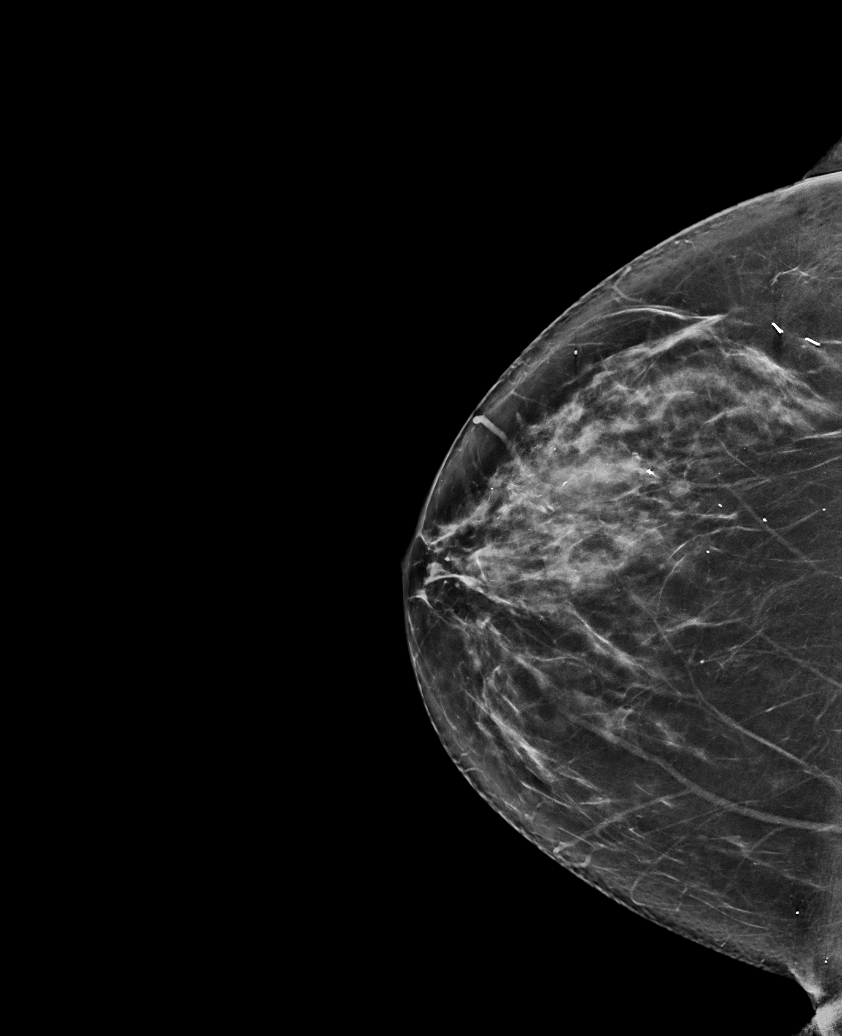

[L CC synth-2D]
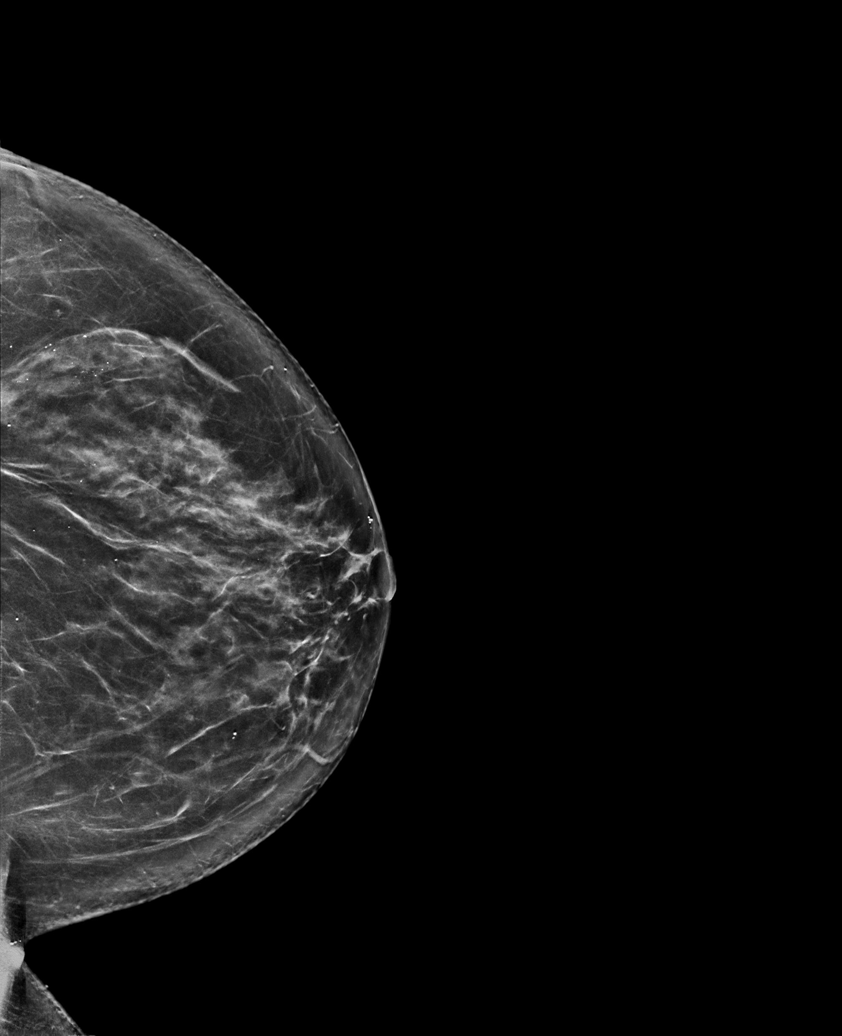

[L CC tomo · tomo slice 41/81.0]
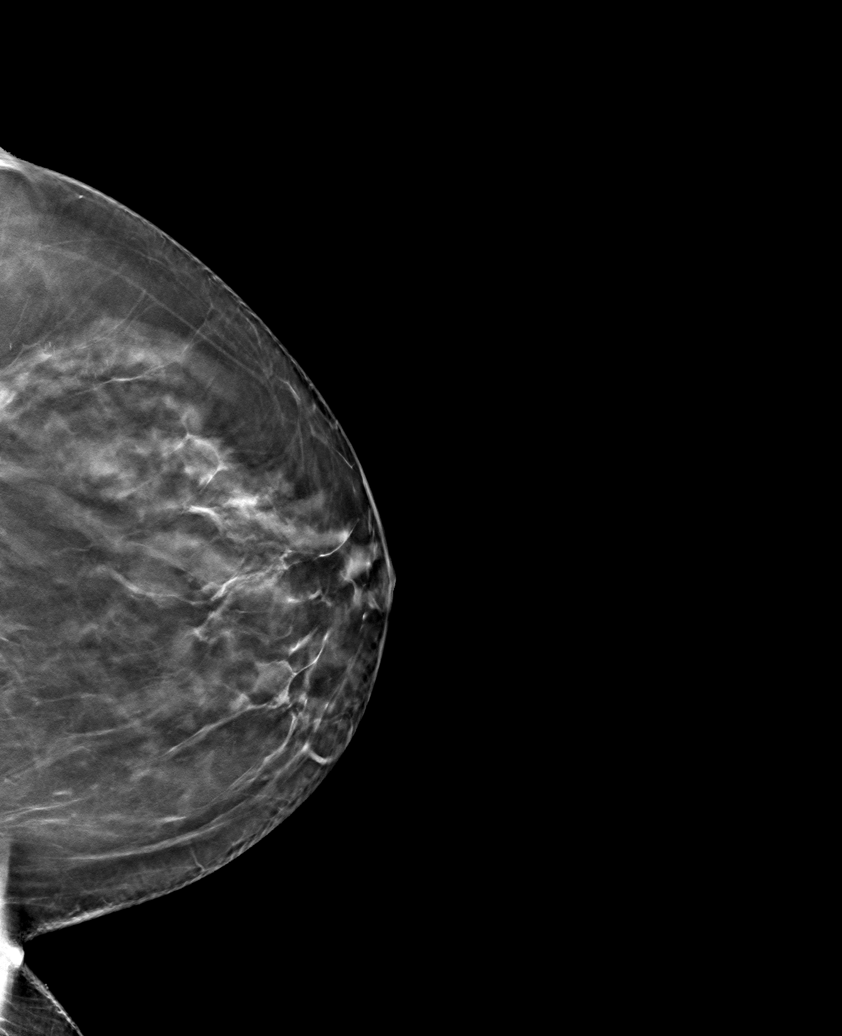

[6 of 30 positions shown; findings below may reference images not displayed]

ACR Breast Density Category c: The breast tissue is heterogeneously
dense, which may obscure small masses.
FINDINGS: No suspicious calcifications, masses or areas of distortion are seen
in the bilateral breasts.

Mammographic images were processed with CAD.

The right breast mass at 8 o'clock, 6 cm from the nipple measures 5
x 2 x 4 mm, previously measuring 6 x 3 x 6 mm. A benign cyst is seen
in the right breast at 8 o'clock, 2 cm from the nipple measuring 6 x
3 x 4 mm.
IMPRESSION: The probably benign right breast mass at 8 o'clock, 6 cm from the
nipple is stable.

RECOMMENDATION:
Bilateral diagnostic mammogram and right breast ultrasound is
recommended in 12 months.

I have discussed the findings and recommendations with the patient.
Results were also provided in writing at the conclusion of the
visit. If applicable, a reminder letter will be sent to the patient
regarding the next appointment.

BI-RADS CATEGORY  3: Probably benign.

## 2019-04-27 DIAGNOSIS — R300 Dysuria: Secondary | ICD-10-CM | POA: Insufficient documentation

## 2019-05-16 ENCOUNTER — Telehealth: Payer: Self-pay

## 2019-05-16 NOTE — Telephone Encounter (Signed)
CONFIRMED AND SCREENED FOR 05-20-19 OV. 

## 2019-05-20 ENCOUNTER — Encounter: Payer: Self-pay | Admitting: Nurse Practitioner

## 2019-05-20 ENCOUNTER — Ambulatory Visit: Payer: 59 | Admitting: Nurse Practitioner

## 2019-05-20 ENCOUNTER — Other Ambulatory Visit: Payer: Self-pay

## 2019-05-20 VITALS — BP 145/89 | HR 90 | Temp 96.8°F | Resp 16 | Ht 63.5 in | Wt 178.4 lb

## 2019-05-20 DIAGNOSIS — J309 Allergic rhinitis, unspecified: Secondary | ICD-10-CM | POA: Diagnosis not present

## 2019-05-20 DIAGNOSIS — I10 Essential (primary) hypertension: Secondary | ICD-10-CM

## 2019-05-20 DIAGNOSIS — E663 Overweight: Secondary | ICD-10-CM | POA: Diagnosis not present

## 2019-05-20 MED ORDER — PHENTERMINE HCL 37.5 MG PO TABS: 37.5000 mg | ORAL_TABLET | Freq: Every day | ORAL | 1 refills | Status: DC

## 2019-05-20 NOTE — Progress Notes (Signed)
[facility] [place], [place] [address]  Internal MEDICINE  Office Visit Note  Patient Name: Rhonda Sanders  [id number]  [id number]  Date of Service: 05/28/2019  Chief Complaint  Patient presents with  . Medical Management of Chronic Issues    Weight Management  . Hypertension  . Hyperlipidemia    The patient is here for routine follow up of weight management. She is currently taking phentermine daily. She has gotten back to the gym at least three to four days per week. She has also decreased calorie intake to 1200 calories per day. She has lost 5 pounds since her last visit. Blood pressure is well managed. She has no negative side effects associated with taking phentermine.       Current Medication: Outpatient Encounter Medications as of 05/20/2019  Medication Sig  . cetirizine (ZYRTEC) 10 MG tablet Take 1 tablet (10 mg total) by mouth daily.  . fluticasone (FLONASE) 50 MCG/ACT nasal spray Place 2 sprays into both nostrils daily.  . hydrochlorothiazide (HYDRODIURIL) 25 MG tablet Take 1 tablet (25 mg total) by mouth daily.  Marland Kitchen lisinopril (ZESTRIL) 30 MG tablet Take 1 tablet (30 mg total) by mouth daily.  . Magnesium 500 MG TABS Take by mouth daily.  . montelukast (SINGULAIR) 10 MG tablet Take 1 tablet (10 mg total) by mouth at bedtime.  . Multiple Vitamin (MULTIVITAMIN) tablet Take 1 tablet by mouth daily.  . phentermine (ADIPEX-P) 37.5 MG tablet Take 1 tablet (37.5 mg total) by mouth daily before breakfast.  . rosuvastatin (CRESTOR) 5 MG tablet Take 1 tablet (5 mg total) by mouth daily.  . valACYclovir (VALTREX) 1000 MG tablet Take 1 tab po twice a day for 5 days prn for fever blister  . [DISCONTINUED] phentermine (ADIPEX-P) 37.5 MG tablet Take 1 tablet (37.5 mg total) by mouth daily before breakfast.   No facility-administered encounter medications on file as of 05/20/2019.    Surgical History: Past Surgical History:  Procedure Laterality Date  .  ABDOMINAL HYSTERECTOMY  04/2006   partial  . BREAST BIOPSY Right 2014   CORE W/CLIP - NEG  . BREAST BIOPSY Right 2015   CORE W/OUT CLIP - NEG COLUMNAR CELL CHANGE WITH MICRO CALCIFICATIONS.   Marland Kitchen BREAST EXCISIONAL BIOPSY Right 2010   EXCISIONAL -ALH, columnar cell hyperplasia  . COLONOSCOPY WITH PROPOFOL N/A 05/26/2015   Procedure: COLONOSCOPY WITH PROPOFOL;  Surgeon: Robert Bellow, MD;  Location: [facility] ENDOSCOPY;  Service: Endoscopy;  Laterality: N/A;    Medical History: Past Medical History:  Diagnosis Date  . Allergy    seasonal   . Atypical lobular hyperplasia of right breast 12/25/2008   Sngle focus noted in area of columnar cell hyperplasia  . Hypercholesterolemia   . Hypertension   . PONV (postoperative nausea and vomiting)     Family History: Family History  Problem Relation Age of Onset  . Breast cancer Mother 24  . Heart disease Father        first MI (28s), died age 52 - MI  . Heart disease Paternal Grandfather   . Heart disease Paternal Uncle   . CVA Paternal Grandmother   . Breast cancer Maternal Aunt 56  . Crohn's disease Brother   . Stomach cancer Maternal Grandfather   . Colon cancer Neg Hx     Social History   Socioeconomic History  . Marital status: Single    Spouse name: Not on file  . Number of children: 0  . Years  of education: Not on file  . Highest education level: Not on file  Occupational History  . Not on file  Tobacco Use  . Smoking status: Never Smoker  . Smokeless tobacco: Never Used  Substance and Sexual Activity  . Alcohol use: Yes    Alcohol/week: 0.0 standard drinks    Comment: occasionally  . Drug use: No  . Sexual activity: Not on file  Other Topics Concern  . Not on file  Social History Narrative  . Not on file   Social Determinants of Health   Financial Resource Strain:   . Difficulty of Paying Living Expenses:   Food Insecurity:   . Worried About Charity fundraiser in the Last Year:   . Arboriculturist in the  Last Year:   Transportation Needs:   . Film/video editor (Medical):   Marland Kitchen Lack of Transportation (Non-Medical):   Physical Activity:   . Days of Exercise per Week:   . Minutes of Exercise per Session:   Stress:   . Feeling of Stress :   Social Connections:   . Frequency of Communication with Friends and Family:   . Frequency of Social Gatherings with Friends and Family:   . Attends Religious Services:   . Active Member of Clubs or Organizations:   . Attends Archivist Meetings:   Marland Kitchen Marital Status:   Intimate Partner Violence:   . Fear of Current or Ex-Partner:   . Emotionally Abused:   Marland Kitchen Physically Abused:   . Sexually Abused:       Review of Systems  Constitutional: Negative for activity change, chills, fatigue and unexpected weight change.       Five pound weight loss since her last visit.   HENT: Negative for congestion, postnasal drip, rhinorrhea, sneezing and sore throat.   Respiratory: Negative for cough, chest tightness, shortness of breath and wheezing.   Cardiovascular: Negative for chest pain and palpitations.  Gastrointestinal: Negative for abdominal pain, constipation, diarrhea, nausea and vomiting.  Endocrine: Negative for cold intolerance, heat intolerance, polydipsia and polyuria.  Musculoskeletal: Negative for arthralgias, back pain, joint swelling and neck pain.  Skin: Negative for rash.  Allergic/Immunologic: Positive for environmental allergies.  Neurological: Negative for dizziness, tremors, numbness and headaches.  Hematological: Negative for adenopathy. Does not bruise/bleed easily.  Psychiatric/Behavioral: Negative for behavioral problems (Depression), sleep disturbance and suicidal ideas. The patient is not nervous/anxious.     Today's Vitals   05/20/19 1615  BP: (!) 145/89  Pulse: 90  Resp: 16  Temp: (!) 96.8 F (36 C)  SpO2: 97%  Weight: 178 lb 6.4 oz (80.9 kg)  Height: 5' 3.5" (1.613 m)   Body mass index is 31.11  kg/m.  Physical Exam Vitals and nursing note reviewed.  Constitutional:      General: She is not in acute distress.    Appearance: Normal appearance. She is well-developed. She is not diaphoretic.  HENT:     Head: Normocephalic and atraumatic.     Nose: Nose normal.     Mouth/Throat:     Pharynx: No oropharyngeal exudate.  Eyes:     Pupils: Pupils are equal, round, and reactive to light.  Neck:     Thyroid: No thyromegaly.     Vascular: No JVD.     Trachea: No tracheal deviation.  Cardiovascular:     Rate and Rhythm: Normal rate and regular rhythm.     Heart sounds: Normal heart sounds. No murmur. No friction rub.  No gallop.   Pulmonary:     Effort: Pulmonary effort is normal. No respiratory distress.     Breath sounds: Normal breath sounds. No wheezing or rales.  Chest:     Chest wall: No tenderness.  Abdominal:     Palpations: Abdomen is soft.  Musculoskeletal:        General: Normal range of motion.     Cervical back: Normal range of motion and neck supple.  Lymphadenopathy:     Cervical: No cervical adenopathy.  Skin:    General: Skin is warm and dry.  Neurological:     Mental Status: She is alert and oriented to person, place, and time.     Cranial Nerves: No cranial nerve deficit.  Psychiatric:        Behavior: Behavior normal.        Thought Content: Thought content normal.        Judgment: Judgment normal.   Assessment/Plan: 1. Essential hypertension Stable. conitnue bp medication as prescribed   2. Chronic allergic rhinitis Well managed. Continue allergy medication as prescribed.   3. Overweight Improved. May continue phentermine daily. Continue to limit calorie intake to 1200 calories per day. Continue with regular exercise regimen.  - phentermine (ADIPEX-P) 37.5 MG tablet; Take 1 tablet (37.5 mg total) by mouth daily before breakfast.  Dispense: 30 tablet; Refill: 1  General Counseling: desaree petry understanding of the findings of todays visit  and agrees with plan of treatment. I have discussed any further diagnostic evaluation that may be needed or ordered today. We also reviewed her medications today. she has been encouraged to call the office with any questions or concerns that should arise related to todays visit.   There is a liability release in patients' chart. There has been a 10 minute discussion about the side effects including but not limited to elevated blood pressure, anxiety, lack of sleep and dry mouth. Pt understands and will like to start/continue on appetite suppressant at this time. There will be one month RX given at the time of visit with proper follow up. Nova diet plan with restricted calories is given to the pt. Pt understands and agrees with  plan of treatment  This patient was seen by Leretha Pol FNP Collaboration with Dr Lavera Guise as a part of collaborative care agreement  Meds ordered this encounter  Medications  . phentermine (ADIPEX-P) 37.5 MG tablet    Sig: Take 1 tablet (37.5 mg total) by mouth daily before breakfast.    Dispense:  30 tablet    Refill:  1    Order Specific Question:   Supervising Provider    Answer:   Lavera Guise [id number]    Total time spent: 20 Minutes   Time spent includes review of chart, medications, test results, and follow up plan with the patient.      Dr Lavera Guise Internal medicine

## 2019-06-27 ENCOUNTER — Telehealth: Payer: Self-pay

## 2019-06-27 NOTE — Telephone Encounter (Signed)
Lmom to confirm and screen for 07-01-19 ov.

## 2019-07-01 ENCOUNTER — Encounter: Payer: Self-pay | Admitting: Nurse Practitioner

## 2019-07-01 ENCOUNTER — Ambulatory Visit: Payer: 59 | Admitting: Nurse Practitioner

## 2019-07-01 ENCOUNTER — Other Ambulatory Visit: Payer: Self-pay

## 2019-07-01 VITALS — BP 136/87 | HR 101 | Temp 97.6°F | Resp 16 | Ht 63.5 in | Wt 176.0 lb

## 2019-07-01 DIAGNOSIS — E663 Overweight: Secondary | ICD-10-CM

## 2019-07-01 DIAGNOSIS — I10 Essential (primary) hypertension: Secondary | ICD-10-CM | POA: Diagnosis not present

## 2019-07-01 DIAGNOSIS — J301 Allergic rhinitis due to pollen: Secondary | ICD-10-CM | POA: Diagnosis not present

## 2019-07-01 MED ORDER — PHENTERMINE HCL 37.5 MG PO TABS: 37.5000 mg | ORAL_TABLET | Freq: Every day | ORAL | 1 refills | Status: DC

## 2019-07-01 NOTE — Progress Notes (Signed)
[facility] [place], [place] [address]  Internal MEDICINE  Office Visit Note  Patient Name: Rhonda Sanders  [id number]  [id number]  Date of Service: 07/16/2019  Chief Complaint  Patient presents with  . Follow-up    WEIGHT MANAGEMENT   . Hypertension  . Hyperlipidemia    The patient is here for routine follow. She is currently taking phentermine for weight management. She has cut back her calorie intake significantly. She is exercising at the gym most evenings. She has no negative side effects from taking this medication. She has lost two pounds since her last visit. Sinuses and allergies are doing well, overall. Stays indoors for the most part.       Current Medication: Outpatient Encounter Medications as of 07/01/2019  Medication Sig  . cetirizine (ZYRTEC) 10 MG tablet Take 1 tablet (10 mg total) by mouth daily.  . fluticasone (FLONASE) 50 MCG/ACT nasal spray Place 2 sprays into both nostrils daily.  . hydrochlorothiazide (HYDRODIURIL) 25 MG tablet Take 1 tablet (25 mg total) by mouth daily.  Marland Kitchen lisinopril (ZESTRIL) 30 MG tablet Take 1 tablet (30 mg total) by mouth daily.  . Magnesium 500 MG TABS Take by mouth daily.  . montelukast (SINGULAIR) 10 MG tablet Take 1 tablet (10 mg total) by mouth at bedtime.  . Multiple Vitamin (MULTIVITAMIN) tablet Take 1 tablet by mouth daily.  . phentermine (ADIPEX-P) 37.5 MG tablet Take 1 tablet (37.5 mg total) by mouth daily before breakfast.  . rosuvastatin (CRESTOR) 5 MG tablet Take 1 tablet (5 mg total) by mouth daily.  . valACYclovir (VALTREX) 1000 MG tablet Take 1 tab po twice a day for 5 days prn for fever blister  . [DISCONTINUED] phentermine (ADIPEX-P) 37.5 MG tablet Take 1 tablet (37.5 mg total) by mouth daily before breakfast.   No facility-administered encounter medications on file as of 07/01/2019.    Surgical History: Past Surgical History:  Procedure Laterality Date  . ABDOMINAL HYSTERECTOMY  04/2006    partial  . BREAST BIOPSY Right 2014   CORE W/CLIP - NEG  . BREAST BIOPSY Right 2015   CORE W/OUT CLIP - NEG COLUMNAR CELL CHANGE WITH MICRO CALCIFICATIONS.   Marland Kitchen BREAST EXCISIONAL BIOPSY Right 2010   EXCISIONAL -ALH, columnar cell hyperplasia  . COLONOSCOPY WITH PROPOFOL N/A 05/26/2015   Procedure: COLONOSCOPY WITH PROPOFOL;  Surgeon: Robert Bellow, MD;  Location: [facility] ENDOSCOPY;  Service: Endoscopy;  Laterality: N/A;    Medical History: Past Medical History:  Diagnosis Date  . Allergy    seasonal   . Atypical lobular hyperplasia of right breast 12/25/2008   Sngle focus noted in area of columnar cell hyperplasia  . Hypercholesterolemia   . Hypertension   . PONV (postoperative nausea and vomiting)     Family History: Family History  Problem Relation Age of Onset  . Breast cancer Mother 41  . Heart disease Father        first MI (65s), died age 41 - MI  . Heart disease Paternal Grandfather   . Heart disease Paternal Uncle   . CVA Paternal Grandmother   . Breast cancer Maternal Aunt 22  . Crohn's disease Brother   . Stomach cancer Maternal Grandfather   . Colon cancer Neg Hx     Social History   Socioeconomic History  . Marital status: Single    Spouse name: Not on file  . Number of children: 0  . Years of education: Not on file  .  Highest education level: Not on file  Occupational History  . Not on file  Tobacco Use  . Smoking status: Never Smoker  . Smokeless tobacco: Never Used  Substance and Sexual Activity  . Alcohol use: Yes    Alcohol/week: 0.0 standard drinks    Comment: occasionally  . Drug use: No  . Sexual activity: Not on file  Other Topics Concern  . Not on file  Social History Narrative  . Not on file   Social Determinants of Health   Financial Resource Strain:   . Difficulty of Paying Living Expenses:   Food Insecurity:   . Worried About Charity fundraiser in the Last Year:   . Arboriculturist in the Last Year:   Transportation  Needs:   . Film/video editor (Medical):   Marland Kitchen Lack of Transportation (Non-Medical):   Physical Activity:   . Days of Exercise per Week:   . Minutes of Exercise per Session:   Stress:   . Feeling of Stress :   Social Connections:   . Frequency of Communication with Friends and Family:   . Frequency of Social Gatherings with Friends and Family:   . Attends Religious Services:   . Active Member of Clubs or Organizations:   . Attends Archivist Meetings:   Marland Kitchen Marital Status:   Intimate Partner Violence:   . Fear of Current or Ex-Partner:   . Emotionally Abused:   Marland Kitchen Physically Abused:   . Sexually Abused:       Review of Systems  Constitutional: Negative for activity change, chills, fatigue and unexpected weight change.       Two pound weight loss since her last visit.   HENT: Negative for congestion, postnasal drip, rhinorrhea, sneezing and sore throat.   Respiratory: Negative for cough, chest tightness, shortness of breath and wheezing.   Cardiovascular: Negative for chest pain and palpitations.  Gastrointestinal: Negative for abdominal pain, constipation, diarrhea, nausea and vomiting.  Endocrine: Negative for cold intolerance, heat intolerance, polydipsia and polyuria.  Musculoskeletal: Negative for arthralgias, back pain, joint swelling and neck pain.  Skin: Negative for rash.  Allergic/Immunologic: Positive for environmental allergies.  Neurological: Negative for dizziness, tremors, numbness and headaches.  Hematological: Negative for adenopathy. Does not bruise/bleed easily.  Psychiatric/Behavioral: Negative for behavioral problems (Depression), sleep disturbance and suicidal ideas. The patient is not nervous/anxious.     Today's Vitals   07/01/19 1549  BP: 136/87  Pulse: (!) 101  Resp: 16  Temp: 97.6 F (36.4 C)  SpO2: 97%  Weight: 176 lb (79.8 kg)  Height: 5' 3.5" (1.613 m)   Body mass index is 30.69 kg/m.  Physical Exam Vitals and nursing note  reviewed.  Constitutional:      General: She is not in acute distress.    Appearance: Normal appearance. She is well-developed. She is not diaphoretic.  HENT:     Head: Normocephalic and atraumatic.     Nose: Nose normal.     Mouth/Throat:     Pharynx: No oropharyngeal exudate.  Eyes:     Pupils: Pupils are equal, round, and reactive to light.  Neck:     Thyroid: No thyromegaly.     Vascular: No JVD.     Trachea: No tracheal deviation.  Cardiovascular:     Rate and Rhythm: Normal rate and regular rhythm.     Heart sounds: Normal heart sounds. No murmur. No friction rub. No gallop.   Pulmonary:     Effort:  Pulmonary effort is normal. No respiratory distress.     Breath sounds: Normal breath sounds. No wheezing or rales.  Chest:     Chest wall: No tenderness.  Abdominal:     Palpations: Abdomen is soft.  Musculoskeletal:        General: Normal range of motion.     Cervical back: Normal range of motion and neck supple.  Lymphadenopathy:     Cervical: No cervical adenopathy.  Skin:    General: Skin is warm and dry.  Neurological:     Mental Status: She is alert and oriented to person, place, and time.     Cranial Nerves: No cranial nerve deficit.  Psychiatric:        Behavior: Behavior normal.        Thought Content: Thought content normal.        Judgment: Judgment normal.    Assessment/Plan: 1. Essential hypertension Blood pressure. Continue bp medication as prescribed.   2. Non-seasonal allergic rhinitis due to pollen Continue all allergy medications and nasal sprays as before.   3. Overweight Improved. May continue to take phentermine 37.5mg  tablets daily. Limit calorie intake to 1200 calories per day and incorporate exercise into daily routine.  - phentermine (ADIPEX-P) 37.5 MG tablet; Take 1 tablet (37.5 mg total) by mouth daily before breakfast.  Dispense: 30 tablet; Refill: 1  General Counseling: buena mckeller understanding of the findings of todays visit  and agrees with plan of treatment. I have discussed any further diagnostic evaluation that may be needed or ordered today. We also reviewed her medications today. she has been encouraged to call the office with any questions or concerns that should arise related to todays visit.    There is a liability release in patients' chart. There has been a 10 minute discussion about the side effects including but not limited to elevated blood pressure, anxiety, lack of sleep and dry mouth. Pt understands and will like to start/continue on appetite suppressant at this time. There will be one month RX given at the time of visit with proper follow up. Nova diet plan with restricted calories is given to the pt. Pt understands and agrees with  plan of treatment  This patient was seen by Leretha Pol FNP Collaboration with Dr Lavera Guise as a part of collaborative care agreement  Meds ordered this encounter  Medications  . phentermine (ADIPEX-P) 37.5 MG tablet    Sig: Take 1 tablet (37.5 mg total) by mouth daily before breakfast.    Dispense:  30 tablet    Refill:  1    Order Specific Question:   Supervising Provider    Answer:   Lavera Guise [id number]    Total time spent: 20 Minutes   Time spent includes review of chart, medications, test results, and follow up plan with the patient.      Dr Lavera Guise Internal medicine

## 2019-07-29 ENCOUNTER — Ambulatory Visit: Payer: 59 | Attending: Internal Medicine

## 2019-07-29 DIAGNOSIS — Z23 Encounter for immunization: Secondary | ICD-10-CM

## 2019-07-29 NOTE — Progress Notes (Signed)
   Covid-19 Vaccination Clinic  Name:  Rhonda Sanders    MRN: [id number] DOB: Jul 06, 1960  07/29/2019  [name] was observed post Covid-19 immunization for 15 minutes without incident. She was provided with Vaccine Information Sheet and instruction to access the V-Safe system.   [name] was instructed to call 911 with any severe reactions post vaccine: Marland Kitchen Difficulty breathing  . Swelling of face and throat  . A fast heartbeat  . A bad rash all over body  . Dizziness and weakness   Immunizations Administered    Name Date Dose VIS Date Route   Pfizer COVID-19 Vaccine 07/29/2019  3:27 PM 0.3 mL 04/30/2018 Intramuscular   Manufacturer: [employer], [employer]   Lot: [id number]   [place]: [id number]

## 2019-08-12 ENCOUNTER — Ambulatory Visit: Payer: 59 | Admitting: Nurse Practitioner

## 2019-08-14 ENCOUNTER — Telehealth: Payer: Self-pay

## 2019-08-14 NOTE — Telephone Encounter (Signed)
Lmom to confirm and screen for 08-18-19 ov.

## 2019-08-18 ENCOUNTER — Encounter: Payer: Self-pay | Admitting: Nurse Practitioner

## 2019-08-18 ENCOUNTER — Other Ambulatory Visit: Payer: Self-pay

## 2019-08-18 ENCOUNTER — Ambulatory Visit: Payer: 59 | Admitting: Nurse Practitioner

## 2019-08-18 VITALS — BP 128/80 | HR 94 | Temp 97.5°F | Resp 16 | Ht 63.5 in | Wt 176.0 lb

## 2019-08-18 DIAGNOSIS — E663 Overweight: Secondary | ICD-10-CM

## 2019-08-18 DIAGNOSIS — I10 Essential (primary) hypertension: Secondary | ICD-10-CM

## 2019-08-18 DIAGNOSIS — J309 Allergic rhinitis, unspecified: Secondary | ICD-10-CM | POA: Diagnosis not present

## 2019-08-18 MED ORDER — PHENTERMINE HCL 37.5 MG PO TABS: 37.5000 mg | ORAL_TABLET | Freq: Every day | ORAL | 1 refills | Status: DC

## 2019-08-18 NOTE — Progress Notes (Signed)
[facility] [place], [place] [address]  Internal MEDICINE  Office Visit Note  Patient Name: Rhonda Sanders  [id number]  [id number]  Date of Service: 08/18/2019  Chief Complaint  Patient presents with  . Follow-up  . Hypertension  . Hyperlipidemia    The patient is here for follow up of weight management. She is taking phentermine daily. She has not gained or lost any weight. She is going to the gym four to five days per week and is then doing exercise outside at least once or twice per week. She has noted that clothes are fitting looser. She is feeling well. Has increased energy. Would like to continue taking this to help manage weight. Blood pressure remains well controlled.  She had her first COVID 19 vaccine 07/29/2019. She did have slight headache and sore arm, but otherwise did well. She gets second vaccine tomorrow afternoon.       Current Medication: Outpatient Encounter Medications as of 08/18/2019  Medication Sig  . cetirizine (ZYRTEC) 10 MG tablet Take 1 tablet (10 mg total) by mouth daily.  . fluticasone (FLONASE) 50 MCG/ACT nasal spray Place 2 sprays into both nostrils daily.  . hydrochlorothiazide (HYDRODIURIL) 25 MG tablet Take 1 tablet (25 mg total) by mouth daily.  Marland Kitchen lisinopril (ZESTRIL) 30 MG tablet Take 1 tablet (30 mg total) by mouth daily.  . Magnesium 500 MG TABS Take by mouth daily.  . montelukast (SINGULAIR) 10 MG tablet Take 1 tablet (10 mg total) by mouth at bedtime.  . Multiple Vitamin (MULTIVITAMIN) tablet Take 1 tablet by mouth daily.  . phentermine (ADIPEX-P) 37.5 MG tablet Take 1 tablet (37.5 mg total) by mouth daily before breakfast.  . rosuvastatin (CRESTOR) 5 MG tablet Take 1 tablet (5 mg total) by mouth daily.  . valACYclovir (VALTREX) 1000 MG tablet Take 1 tab po twice a day for 5 days prn for fever blister  . [DISCONTINUED] phentermine (ADIPEX-P) 37.5 MG tablet Take 1 tablet (37.5 mg total) by mouth daily before breakfast.    No facility-administered encounter medications on file as of 08/18/2019.    Surgical History: Past Surgical History:  Procedure Laterality Date  . ABDOMINAL HYSTERECTOMY  04/2006   partial  . BREAST BIOPSY Right 2014   CORE W/CLIP - NEG  . BREAST BIOPSY Right 2015   CORE W/OUT CLIP - NEG COLUMNAR CELL CHANGE WITH MICRO CALCIFICATIONS.   Marland Kitchen BREAST EXCISIONAL BIOPSY Right 2010   EXCISIONAL -ALH, columnar cell hyperplasia  . COLONOSCOPY WITH PROPOFOL N/A 05/26/2015   Procedure: COLONOSCOPY WITH PROPOFOL;  Surgeon: Robert Bellow, MD;  Location: [facility] ENDOSCOPY;  Service: Endoscopy;  Laterality: N/A;    Medical History: Past Medical History:  Diagnosis Date  . Allergy    seasonal   . Atypical lobular hyperplasia of right breast 12/25/2008   Sngle focus noted in area of columnar cell hyperplasia  . Hypercholesterolemia   . Hypertension   . PONV (postoperative nausea and vomiting)     Family History: Family History  Problem Relation Age of Onset  . Breast cancer Mother 36  . Heart disease Father        first MI (21s), died age 54 - MI  . Heart disease Paternal Grandfather   . Heart disease Paternal Uncle   . CVA Paternal Grandmother   . Breast cancer Maternal Aunt 40  . Crohn's disease Brother   . Stomach cancer Maternal Grandfather   . Colon cancer Neg Hx  Social History   Socioeconomic History  . Marital status: Single    Spouse name: Not on file  . Number of children: 0  . Years of education: Not on file  . Highest education level: Not on file  Occupational History  . Not on file  Tobacco Use  . Smoking status: Never Smoker  . Smokeless tobacco: Never Used  Vaping Use  . Vaping Use: Never used  Substance and Sexual Activity  . Alcohol use: Yes    Alcohol/week: 0.0 standard drinks    Comment: occasionally  . Drug use: No  . Sexual activity: Not on file  Other Topics Concern  . Not on file  Social History Narrative  . Not on file   Social  Determinants of Health   Financial Resource Strain:   . Difficulty of Paying Living Expenses:   Food Insecurity:   . Worried About Charity fundraiser in the Last Year:   . Arboriculturist in the Last Year:   Transportation Needs:   . Film/video editor (Medical):   Marland Kitchen Lack of Transportation (Non-Medical):   Physical Activity:   . Days of Exercise per Week:   . Minutes of Exercise per Session:   Stress:   . Feeling of Stress :   Social Connections:   . Frequency of Communication with Friends and Family:   . Frequency of Social Gatherings with Friends and Family:   . Attends Religious Services:   . Active Member of Clubs or Organizations:   . Attends Archivist Meetings:   Marland Kitchen Marital Status:   Intimate Partner Violence:   . Fear of Current or Ex-Partner:   . Emotionally Abused:   Marland Kitchen Physically Abused:   . Sexually Abused:       Review of Systems  Constitutional: Negative for activity change, chills, fatigue and unexpected weight change.       Weight stable since her last visit.   HENT: Negative for congestion, postnasal drip, rhinorrhea, sneezing and sore throat.   Respiratory: Negative for cough, chest tightness, shortness of breath and wheezing.   Cardiovascular: Negative for chest pain and palpitations.  Gastrointestinal: Negative for abdominal pain, constipation, diarrhea, nausea and vomiting.  Endocrine: Negative for cold intolerance, heat intolerance, polydipsia and polyuria.  Musculoskeletal: Negative for arthralgias, back pain, joint swelling and neck pain.  Skin: Negative for rash.  Allergic/Immunologic: Positive for environmental allergies.  Neurological: Negative for dizziness, tremors, numbness and headaches.  Hematological: Negative for adenopathy. Does not bruise/bleed easily.  Psychiatric/Behavioral: Negative for behavioral problems (Depression), sleep disturbance and suicidal ideas. The patient is not nervous/anxious.     Today's Vitals    08/18/19 1518  BP: 128/80  Pulse: 94  Resp: 16  Temp: (!) 97.5 F (36.4 C)  SpO2: 98%  Weight: 176 lb (79.8 kg)  Height: 5' 3.5" (1.613 m)   Body mass index is 30.69 kg/m.  Physical Exam Vitals and nursing note reviewed.  Constitutional:      General: She is not in acute distress.    Appearance: Normal appearance. She is well-developed. She is not diaphoretic.  HENT:     Head: Normocephalic and atraumatic.     Nose: Nose normal.     Mouth/Throat:     Pharynx: No oropharyngeal exudate.  Eyes:     Pupils: Pupils are equal, round, and reactive to light.  Neck:     Thyroid: No thyromegaly.     Vascular: No JVD.  Trachea: No tracheal deviation.  Cardiovascular:     Rate and Rhythm: Normal rate and regular rhythm.     Heart sounds: Normal heart sounds. No murmur heard.  No friction rub. No gallop.   Pulmonary:     Effort: Pulmonary effort is normal. No respiratory distress.     Breath sounds: Normal breath sounds. No wheezing or rales.  Chest:     Chest wall: No tenderness.  Abdominal:     Palpations: Abdomen is soft.  Musculoskeletal:        General: Normal range of motion.     Cervical back: Normal range of motion and neck supple.  Lymphadenopathy:     Cervical: No cervical adenopathy.  Skin:    General: Skin is warm and dry.  Neurological:     Mental Status: She is alert and oriented to person, place, and time.     Cranial Nerves: No cranial nerve deficit.  Psychiatric:        Behavior: Behavior normal.        Thought Content: Thought content normal.        Judgment: Judgment normal.   Assessment/Plan: 1. Essential hypertension Well managed. Continue bp medication as prescribed   2. Overweight No change. Has noted decreased measurement sizes. Will conitnue phentermine daily. Limit calorie intake to 1200 calories per day and continue with daily exercise.  - phentermine (ADIPEX-P) 37.5 MG tablet; Take 1 tablet (37.5 mg total) by mouth daily before breakfast.   Dispense: 30 tablet; Refill: 1  3. Chronic allergic rhinitis Well controlled. Continue all allergy medication as prescribed.    General Counseling: jenel gierke understanding of the findings of todays visit and agrees with plan of treatment. I have discussed any further diagnostic evaluation that may be needed or ordered today. We also reviewed her medications today. she has been encouraged to call the office with any questions or concerns that should arise related to todays visit.   Hypertension Counseling:   The following hypertensive lifestyle modification were recommended and discussed:  1. Limiting alcohol intake to less than 1 oz/day of ethanol:(24 oz of beer or 8 oz of wine or 2 oz of 100-proof whiskey). 2. Take baby ASA 81 mg daily. 3. Importance of regular aerobic exercise and losing weight. 4. Reduce dietary saturated fat and cholesterol intake for overall cardiovascular health. 5. Maintaining adequate dietary potassium, calcium, and magnesium intake. 6. Regular monitoring of the blood pressure. 7. Reduce sodium intake to less than 100 mmol/day (less than 2.3 gm of sodium or less than 6 gm of sodium choride)   This patient was seen by [place] with Dr Lavera Guise as a part of collaborative care agreement  Meds ordered this encounter  Medications  . phentermine (ADIPEX-P) 37.5 MG tablet    Sig: Take 1 tablet (37.5 mg total) by mouth daily before breakfast.    Dispense:  30 tablet    Refill:  1    Order Specific Question:   Supervising Provider    Answer:   Lavera Guise [[id number]]    Total time spent: 25 Minutes   Time spent includes review of chart, medications, test results, and follow up plan with the patient.      Dr Lavera Guise Internal medicine

## 2019-08-19 ENCOUNTER — Ambulatory Visit: Payer: 59 | Attending: Internal Medicine

## 2019-08-19 DIAGNOSIS — Z23 Encounter for immunization: Secondary | ICD-10-CM

## 2019-08-19 NOTE — Progress Notes (Signed)
   Covid-19 Vaccination Clinic  Name:  Rhonda Sanders    MRN: [id number] DOB: [birth year]  08/19/2019  [name] was observed post Covid-19 immunization for 15 minutes without incident. She was provided with Vaccine Information Sheet and instruction to access the V-Safe system.   [name] was instructed to call 911 with any severe reactions post vaccine: Marland Kitchen Difficulty breathing  . Swelling of face and throat  . A fast heartbeat  . A bad rash all over body  . Dizziness and weakness   Immunizations Administered    Name Date Dose VIS Date Route   Pfizer COVID-19 Vaccine 08/19/2019  3:01 PM 0.3 mL 04/30/2018 Intramuscular   Manufacturer: [place]   Lot: [id number]   [place]: [address]-8

## 2019-09-26 ENCOUNTER — Telehealth: Payer: Self-pay

## 2019-09-26 NOTE — Telephone Encounter (Signed)
Lmom to confirm and screen for 09-30-19 ov.

## 2019-09-30 ENCOUNTER — Ambulatory Visit: Payer: 59 | Admitting: Nurse Practitioner

## 2019-10-14 ENCOUNTER — Telehealth: Payer: Self-pay

## 2019-10-14 NOTE — Telephone Encounter (Signed)
Confirmed and screened for 10-16-19 ov. 

## 2019-10-16 ENCOUNTER — Encounter: Payer: Self-pay | Admitting: Nurse Practitioner

## 2019-10-16 ENCOUNTER — Other Ambulatory Visit: Payer: Self-pay

## 2019-10-16 ENCOUNTER — Ambulatory Visit: Payer: 59 | Admitting: Nurse Practitioner

## 2019-10-16 VITALS — BP 134/85 | HR 100 | Temp 97.6°F | Resp 16 | Ht 63.5 in | Wt 177.6 lb

## 2019-10-16 DIAGNOSIS — J309 Allergic rhinitis, unspecified: Secondary | ICD-10-CM

## 2019-10-16 DIAGNOSIS — I10 Essential (primary) hypertension: Secondary | ICD-10-CM | POA: Diagnosis not present

## 2019-10-16 DIAGNOSIS — E663 Overweight: Secondary | ICD-10-CM

## 2019-10-16 DIAGNOSIS — E782 Mixed hyperlipidemia: Secondary | ICD-10-CM

## 2019-10-16 MED ORDER — PHENTERMINE HCL 37.5 MG PO TABS: 37.5000 mg | ORAL_TABLET | Freq: Every day | ORAL | 0 refills | Status: DC

## 2019-10-16 NOTE — Progress Notes (Signed)
[facility] [place], [place] [address]  Internal MEDICINE  Office Visit Note  Patient Name: Rhonda Sanders  [id number]  [id number]  Date of Service: 11/02/2019  Chief Complaint  Patient presents with  . Follow-up  . Hyperlipidemia  . Hypertension  . Quality Metric Gaps    Tdap    The patient is here for routine follow up. She is currently taking phentermine to help with weight management. She has had a one pound weight gain since her last visit. She continues to consume a low calorie diet and is going to the gym three to four times per week. She has no negative side effects associated with taking this medication.       Current Medication: Outpatient Encounter Medications as of 10/16/2019  Medication Sig  . cetirizine (ZYRTEC) 10 MG tablet Take 1 tablet (10 mg total) by mouth daily.  . fluticasone (FLONASE) 50 MCG/ACT nasal spray Place 2 sprays into both nostrils daily.  . hydrochlorothiazide (HYDRODIURIL) 25 MG tablet Take 1 tablet (25 mg total) by mouth daily.  Marland Kitchen lisinopril (ZESTRIL) 30 MG tablet Take 1 tablet (30 mg total) by mouth daily.  . Magnesium 500 MG TABS Take by mouth daily.  . montelukast (SINGULAIR) 10 MG tablet Take 1 tablet (10 mg total) by mouth at bedtime.  . Multiple Vitamin (MULTIVITAMIN) tablet Take 1 tablet by mouth daily.  . phentermine (ADIPEX-P) 37.5 MG tablet Take 1 tablet (37.5 mg total) by mouth daily before breakfast.  . rosuvastatin (CRESTOR) 5 MG tablet Take 1 tablet (5 mg total) by mouth daily.  . valACYclovir (VALTREX) 1000 MG tablet Take 1 tab po twice a day for 5 days prn for fever blister  . [DISCONTINUED] phentermine (ADIPEX-P) 37.5 MG tablet Take 1 tablet (37.5 mg total) by mouth daily before breakfast.   No facility-administered encounter medications on file as of 10/16/2019.    Surgical History: Past Surgical History:  Procedure Laterality Date  . ABDOMINAL HYSTERECTOMY  04/2006   partial  . BREAST BIOPSY  Right 2014   CORE W/CLIP - NEG  . BREAST BIOPSY Right 2015   CORE W/OUT CLIP - NEG COLUMNAR CELL CHANGE WITH MICRO CALCIFICATIONS.   Marland Kitchen BREAST EXCISIONAL BIOPSY Right 2010   EXCISIONAL -ALH, columnar cell hyperplasia  . COLONOSCOPY WITH PROPOFOL N/A 05/26/2015   Procedure: COLONOSCOPY WITH PROPOFOL;  Surgeon: Robert Bellow, MD;  Location: [facility] ENDOSCOPY;  Service: Endoscopy;  Laterality: N/A;    Medical History: Past Medical History:  Diagnosis Date  . Allergy    seasonal   . Atypical lobular hyperplasia of right breast 12/25/2008   Sngle focus noted in area of columnar cell hyperplasia  . Hypercholesterolemia   . Hypertension   . PONV (postoperative nausea and vomiting)     Family History: Family History  Problem Relation Age of Onset  . Breast cancer Mother 42  . Heart disease Father        first MI (60s), died age 28 - MI  . Heart disease Paternal Grandfather   . Heart disease Paternal Uncle   . CVA Paternal Grandmother   . Breast cancer Maternal Aunt 20  . Crohn's disease Brother   . Stomach cancer Maternal Grandfather   . Colon cancer Neg Hx     Social History   Socioeconomic History  . Marital status: Single    Spouse name: Not on file  . Number of children: 0  . Years of education: Not on file  .  Highest education level: Not on file  Occupational History  . Not on file  Tobacco Use  . Smoking status: Never Smoker  . Smokeless tobacco: Never Used  Vaping Use  . Vaping Use: Never used  Substance and Sexual Activity  . Alcohol use: Yes    Alcohol/week: 0.0 standard drinks    Comment: occasionally  . Drug use: No  . Sexual activity: Not on file  Other Topics Concern  . Not on file  Social History Narrative  . Not on file   Social Determinants of Health   Financial Resource Strain:   . Difficulty of Paying Living Expenses: Not on file  Food Insecurity:   . Worried About Charity fundraiser in the Last Year: Not on file  . Ran Out of Food in the  Last Year: Not on file  Transportation Needs:   . Lack of Transportation (Medical): Not on file  . Lack of Transportation (Non-Medical): Not on file  Physical Activity:   . Days of Exercise per Week: Not on file  . Minutes of Exercise per Session: Not on file  Stress:   . Feeling of Stress : Not on file  Social Connections:   . Frequency of Communication with Friends and Family: Not on file  . Frequency of Social Gatherings with Friends and Family: Not on file  . Attends Religious Services: Not on file  . Active Member of Clubs or Organizations: Not on file  . Attends Archivist Meetings: Not on file  . Marital Status: Not on file  Intimate Partner Violence:   . Fear of Current or Ex-Partner: Not on file  . Emotionally Abused: Not on file  . Physically Abused: Not on file  . Sexually Abused: Not on file      Review of Systems  Constitutional: Negative for activity change, chills, fatigue and unexpected weight change.       One pound weight gain since last visit.   HENT: Negative for congestion, postnasal drip, rhinorrhea, sneezing and sore throat.   Respiratory: Negative for cough, chest tightness, shortness of breath and wheezing.   Cardiovascular: Negative for chest pain and palpitations.  Gastrointestinal: Negative for abdominal pain, constipation, diarrhea, nausea and vomiting.  Endocrine: Negative for cold intolerance, heat intolerance, polydipsia and polyuria.  Musculoskeletal: Negative for arthralgias, back pain, joint swelling and neck pain.  Skin: Negative for rash.  Allergic/Immunologic: Positive for environmental allergies.  Neurological: Negative for dizziness, tremors, numbness and headaches.  Hematological: Negative for adenopathy. Does not bruise/bleed easily.  Psychiatric/Behavioral: Negative for behavioral problems (Depression), sleep disturbance and suicidal ideas. The patient is not nervous/anxious.    Today's Vitals   10/16/19 1559  BP: 134/85   Pulse: 100  Resp: 16  Temp: 97.6 F (36.4 C)  SpO2: 96%  Weight: 177 lb 9.6 oz (80.6 kg)  Height: 5' 3.5" (1.613 m)   Body mass index is 30.97 kg/m.  Physical Exam Vitals and nursing note reviewed.  Constitutional:      General: She is not in acute distress.    Appearance: Normal appearance. She is well-developed. She is not diaphoretic.  HENT:     Head: Normocephalic and atraumatic.     Nose: Nose normal.     Mouth/Throat:     Pharynx: No oropharyngeal exudate.  Eyes:     Pupils: Pupils are equal, round, and reactive to light.  Neck:     Thyroid: No thyromegaly.     Vascular: No JVD.  Trachea: No tracheal deviation.  Cardiovascular:     Rate and Rhythm: Normal rate and regular rhythm.     Heart sounds: Normal heart sounds. No murmur heard.  No friction rub. No gallop.   Pulmonary:     Effort: Pulmonary effort is normal. No respiratory distress.     Breath sounds: Normal breath sounds. No wheezing or rales.  Chest:     Chest wall: No tenderness.  Abdominal:     Palpations: Abdomen is soft.  Musculoskeletal:        General: Normal range of motion.     Cervical back: Normal range of motion and neck supple.  Lymphadenopathy:     Cervical: No cervical adenopathy.  Skin:    General: Skin is warm and dry.  Neurological:     Mental Status: She is alert and oriented to person, place, and time.     Cranial Nerves: No cranial nerve deficit.  Psychiatric:        Mood and Affect: Mood normal.        Behavior: Behavior normal.        Thought Content: Thought content normal.        Judgment: Judgment normal.   Assessment/Plan: 1. Essential hypertension Stable. Continue bp medication as prescribed   2. Mixed hyperlipidemia Well managed. Continue rosuvastatin as prescribed   3. Overweight Will have patient gradually wean off phentermine. Discussed instructions for weaning off phentermine. Patient voiced understanding and agreement. She should continue to limit  calorie intake to 1200 calories per day and incorporate exercise into daily routine.  - phentermine (ADIPEX-P) 37.5 MG tablet; Take 1 tablet (37.5 mg total) by mouth daily before breakfast.  Dispense: 30 tablet; Refill: 0  4. Chronic allergic rhinitis Continue all allergy medication as prescribed   General Counseling: ethelwyn gilbertson understanding of the findings of todays visit and agrees with plan of treatment. I have discussed any further diagnostic evaluation that may be needed or ordered today. We also reviewed her medications today. she has been encouraged to call the office with any questions or concerns that should arise related to todays visit.   This patient was seen by [place] with Dr Lavera Guise as a part of collaborative care agreement  Meds ordered this encounter  Medications  . phentermine (ADIPEX-P) 37.5 MG tablet    Sig: Take 1 tablet (37.5 mg total) by mouth daily before breakfast.    Dispense:  30 tablet    Refill:  0    Order Specific Question:   Supervising Provider    Answer:   Lavera Guise [[id number]]    Total time spent: 30 Minutes   Time spent includes review of chart, medications, test results, and follow up plan with the patient.      Dr Lavera Guise Internal medicine

## 2019-11-07 ENCOUNTER — Other Ambulatory Visit: Payer: Self-pay

## 2019-11-07 DIAGNOSIS — J309 Allergic rhinitis, unspecified: Secondary | ICD-10-CM

## 2019-11-07 MED ORDER — ROSUVASTATIN CALCIUM 5 MG PO TABS: 5.0000 mg | ORAL_TABLET | Freq: Every day | ORAL | 1 refills | Status: DC

## 2020-01-15 ENCOUNTER — Encounter: Payer: Self-pay | Admitting: Nurse Practitioner

## 2020-01-15 ENCOUNTER — Ambulatory Visit: Payer: 59 | Admitting: Nurse Practitioner

## 2020-01-15 ENCOUNTER — Other Ambulatory Visit: Payer: Self-pay

## 2020-01-15 VITALS — BP 138/86 | HR 100 | Temp 98.0°F | Resp 16 | Ht 63.5 in | Wt 175.8 lb

## 2020-01-15 DIAGNOSIS — R7301 Impaired fasting glucose: Secondary | ICD-10-CM | POA: Diagnosis not present

## 2020-01-15 DIAGNOSIS — Z23 Encounter for immunization: Secondary | ICD-10-CM

## 2020-01-15 DIAGNOSIS — I10 Essential (primary) hypertension: Secondary | ICD-10-CM

## 2020-01-15 LAB — POCT GLYCOSYLATED HEMOGLOBIN (HGB A1C): Hemoglobin A1C: 6.3 % — AB (ref 4.0–5.6)

## 2020-01-15 NOTE — Progress Notes (Signed)
[facility] [place], [place] [address]  Internal MEDICINE  Office Visit Note  Patient Name: Rhonda Sanders  [id number]  [id number]  Date of Service: 02/14/2020  Chief Complaint  Patient presents with  . Follow-up  . Hyperlipidemia  . Hypertension  . controlled substance form    reviewed with PT    The patient is here for follow up visit. Had employment screening for her job. She was told that blood sugar, fasting, was 117. This has gradually increased over time. She is concerned as she does have family history of diabetes. Her HgbA1c is 6.3 today. We have discussed diet and lifestyle changes needed to control blood sugars without adding medications. The patient will monitor her blood sugars closely. Her blood pressure is well managed. She has had a two pound weight loss since her last visit. She has no new concerns or comlaints today. She would like to get her flu shot.       Current Medication: Outpatient Encounter Medications as of 01/15/2020  Medication Sig  . cetirizine (ZYRTEC) 10 MG tablet Take 1 tablet (10 mg total) by mouth daily.  . fluticasone (FLONASE) 50 MCG/ACT nasal spray Place 2 sprays into both nostrils daily.  . hydrochlorothiazide (HYDRODIURIL) 25 MG tablet Take 1 tablet (25 mg total) by mouth daily.  Marland Kitchen lisinopril (ZESTRIL) 30 MG tablet Take 1 tablet (30 mg total) by mouth daily.  . Magnesium 500 MG TABS Take by mouth daily.  . montelukast (SINGULAIR) 10 MG tablet Take 1 tablet (10 mg total) by mouth at bedtime.  . Multiple Vitamin (MULTIVITAMIN) tablet Take 1 tablet by mouth daily.  . phentermine (ADIPEX-P) 37.5 MG tablet Take 1 tablet (37.5 mg total) by mouth daily before breakfast.  . rosuvastatin (CRESTOR) 5 MG tablet Take 1 tablet (5 mg total) by mouth daily.  . valACYclovir (VALTREX) 1000 MG tablet Take 1 tab po twice a day for 5 days prn for fever blister   No facility-administered encounter medications on file as of 01/15/2020.     Surgical History: Past Surgical History:  Procedure Laterality Date  . ABDOMINAL HYSTERECTOMY  04/2006   partial  . BREAST BIOPSY Right 2014   CORE W/CLIP - NEG  . BREAST BIOPSY Right 2015   CORE W/OUT CLIP - NEG COLUMNAR CELL CHANGE WITH MICRO CALCIFICATIONS.   Marland Kitchen BREAST EXCISIONAL BIOPSY Right 2010   EXCISIONAL -ALH, columnar cell hyperplasia  . COLONOSCOPY WITH PROPOFOL N/A 05/26/2015   Procedure: COLONOSCOPY WITH PROPOFOL;  Surgeon: Robert Bellow, MD;  Location: [facility] ENDOSCOPY;  Service: Endoscopy;  Laterality: N/A;    Medical History: Past Medical History:  Diagnosis Date  . Allergy    seasonal   . Atypical lobular hyperplasia of right breast 12/25/2008   Sngle focus noted in area of columnar cell hyperplasia  . Hypercholesterolemia   . Hypertension   . PONV (postoperative nausea and vomiting)     Family History: Family History  Problem Relation Age of Onset  . Breast cancer Mother 32  . Heart disease Father        first MI (81s), died age 64 - MI  . Heart disease Paternal Grandfather   . Heart disease Paternal Uncle   . CVA Paternal Grandmother   . Breast cancer Maternal Aunt 24  . Crohn's disease Brother   . Stomach cancer Maternal Grandfather   . Colon cancer Neg Hx     Social History   Socioeconomic History  . Marital status:  Single    Spouse name: Not on file  . Number of children: 0  . Years of education: Not on file  . Highest education level: Not on file  Occupational History  . Not on file  Tobacco Use  . Smoking status: Never Smoker  . Smokeless tobacco: Never Used  Vaping Use  . Vaping Use: Never used  Substance and Sexual Activity  . Alcohol use: Yes    Alcohol/week: 0.0 standard drinks    Comment: occasionally  . Drug use: No  . Sexual activity: Not on file  Other Topics Concern  . Not on file  Social History Narrative  . Not on file   Social Determinants of Health   Financial Resource Strain: Not on file  Food  Insecurity: Not on file  Transportation Needs: Not on file  Physical Activity: Not on file  Stress: Not on file  Social Connections: Not on file  Intimate Partner Violence: Not on file      Review of Systems  Constitutional: Negative for activity change, chills, fatigue and unexpected weight change.       Two pound weight loss since last visit   HENT: Negative for congestion, postnasal drip, rhinorrhea, sneezing and sore throat.   Respiratory: Negative for cough, chest tightness, shortness of breath and wheezing.   Cardiovascular: Negative for chest pain and palpitations.  Gastrointestinal: Negative for abdominal pain, constipation, diarrhea, nausea and vomiting.  Endocrine: Negative for cold intolerance, heat intolerance, polydipsia and polyuria.       Elevated blood sugar since her last visit. HgbA1c is 6.3 today,   Musculoskeletal: Negative for arthralgias, back pain, joint swelling and neck pain.  Skin: Negative for rash.  Allergic/Immunologic: Positive for environmental allergies.  Neurological: Negative for dizziness, tremors, numbness and headaches.  Hematological: Negative for adenopathy. Does not bruise/bleed easily.  Psychiatric/Behavioral: Negative for behavioral problems (Depression), sleep disturbance and suicidal ideas. The patient is not nervous/anxious.     Today's Vitals   01/15/20 1626  BP: 138/86  Pulse: 100  Resp: 16  Temp: 98 F (36.7 C)  SpO2: 98%  Weight: 175 lb 12.8 oz (79.7 kg)  Height: 5' 3.5" (1.613 m)   Body mass index is 30.65 kg/m.  Physical Exam Vitals and nursing note reviewed.  Constitutional:      General: She is not in acute distress.    Appearance: Normal appearance. She is well-developed. She is not diaphoretic.  HENT:     Head: Normocephalic and atraumatic.     Nose: Nose normal.     Mouth/Throat:     Pharynx: No oropharyngeal exudate.  Eyes:     Pupils: Pupils are equal, round, and reactive to light.  Neck:     Thyroid: No  thyromegaly.     Vascular: No JVD.     Trachea: No tracheal deviation.  Cardiovascular:     Rate and Rhythm: Normal rate and regular rhythm.     Heart sounds: Normal heart sounds. No murmur heard. No friction rub. No gallop.   Pulmonary:     Effort: Pulmonary effort is normal. No respiratory distress.     Breath sounds: Normal breath sounds. No wheezing or rales.  Chest:     Chest wall: No tenderness.  Abdominal:     Palpations: Abdomen is soft.  Musculoskeletal:        General: Normal range of motion.     Cervical back: Normal range of motion and neck supple.  Lymphadenopathy:     Cervical:  No cervical adenopathy.  Skin:    General: Skin is warm and dry.  Neurological:     Mental Status: She is alert and oriented to person, place, and time.     Cranial Nerves: No cranial nerve deficit.  Psychiatric:        Mood and Affect: Mood normal.        Behavior: Behavior normal.        Thought Content: Thought content normal.        Judgment: Judgment normal.    Assessment/Plan: 1. Essential hypertension Stable. Continue bp medication as prescribed   2. Impaired fasting glucose - POCT glycosylated hemoglobin (Hb A1C)   6.3 today. Discussed diet and lifestyle changes associated with lowering blood sugars without adding medication. Will monitor blood sugars closely.    3. Flu vaccine need Flu vaccine administered today.  - Flu Vaccine MDCK QUAD PF  General Counseling: sally-ann cutbirth understanding of the findings of todays visit and agrees with plan of treatment. I have discussed any further diagnostic evaluation that may be needed or ordered today. We also reviewed her medications today. she has been encouraged to call the office with any questions or concerns that should arise related to todays visit.  Diabetes Counseling:  1. Addition of ACE inh/ ARB'S for nephroprotection. Microalbumin is updated  2. Diabetic foot care, prevention of complications. Podiatry consult 3.  Exercise and lose weight.  4. Diabetic eye examination, Diabetic eye exam is updated  5. Monitor blood sugar closlely. nutrition counseling.  6. Sign and symptoms of hypoglycemia including shaking sweating,confusion and headaches.  This patient was seen by Leretha Pol FNP Collaboration with Dr Lavera Guise as a part of collaborative care agreement  Orders Placed This Encounter  Procedures  . Flu Vaccine MDCK QUAD PF  . POCT glycosylated hemoglobin (Hb A1C)    Total time spent: 30 Minutes   Time spent includes review of chart, medications, test results, and follow up plan with the patient.      Dr Lavera Guise Internal medicine

## 2020-02-14 DIAGNOSIS — Z23 Encounter for immunization: Secondary | ICD-10-CM | POA: Insufficient documentation

## 2020-02-19 ENCOUNTER — Other Ambulatory Visit: Payer: Self-pay

## 2020-02-19 DIAGNOSIS — I10 Essential (primary) hypertension: Secondary | ICD-10-CM

## 2020-02-19 MED ORDER — HYDROCHLOROTHIAZIDE 25 MG PO TABS: 25.0000 mg | ORAL_TABLET | Freq: Every day | ORAL | 3 refills | Status: DC

## 2020-04-13 ENCOUNTER — Telehealth: Payer: Self-pay

## 2020-04-13 ENCOUNTER — Other Ambulatory Visit: Payer: Self-pay

## 2020-04-13 DIAGNOSIS — Z1231 Encounter for screening mammogram for malignant neoplasm of breast: Secondary | ICD-10-CM

## 2020-04-13 NOTE — Telephone Encounter (Signed)
LMOM following up on a message she left. Wanted to make sure the order for her mammogram was taken care of. The order looked like it was in the computer and everything had been resolved, but wanted to double check with pt.

## 2020-04-13 NOTE — Progress Notes (Signed)
Added yearly screen mammo. Rhonda Sanders

## 2020-04-23 ENCOUNTER — Encounter: Payer: 59 | Admitting: Hospice and Palliative Medicine

## 2020-05-05 ENCOUNTER — Other Ambulatory Visit: Payer: Self-pay | Admitting: Nurse Practitioner

## 2020-05-05 DIAGNOSIS — J309 Allergic rhinitis, unspecified: Secondary | ICD-10-CM

## 2020-05-05 DIAGNOSIS — I10 Essential (primary) hypertension: Secondary | ICD-10-CM

## 2020-05-08 ENCOUNTER — Other Ambulatory Visit: Payer: Self-pay | Admitting: Nurse Practitioner

## 2020-05-08 DIAGNOSIS — I10 Essential (primary) hypertension: Secondary | ICD-10-CM

## 2020-05-08 DIAGNOSIS — J309 Allergic rhinitis, unspecified: Secondary | ICD-10-CM

## 2020-05-12 ENCOUNTER — Other Ambulatory Visit: Payer: Self-pay

## 2020-05-12 DIAGNOSIS — J309 Allergic rhinitis, unspecified: Secondary | ICD-10-CM

## 2020-05-12 DIAGNOSIS — I10 Essential (primary) hypertension: Secondary | ICD-10-CM

## 2020-05-12 MED ORDER — MONTELUKAST SODIUM 10 MG PO TABS: 10.0000 mg | ORAL_TABLET | Freq: Every day | ORAL | 3 refills | Status: DC

## 2020-05-12 MED ORDER — LISINOPRIL 30 MG PO TABS: 30.0000 mg | ORAL_TABLET | Freq: Every day | ORAL | 3 refills | Status: DC

## 2020-05-12 MED ORDER — ROSUVASTATIN CALCIUM 5 MG PO TABS: 5.0000 mg | ORAL_TABLET | Freq: Every day | ORAL | 1 refills | Status: DC

## 2020-05-12 MED ORDER — HYDROCHLOROTHIAZIDE 25 MG PO TABS: 25.0000 mg | ORAL_TABLET | Freq: Every day | ORAL | 1 refills | Status: DC

## 2020-05-13 ENCOUNTER — Encounter: Payer: Self-pay | Admitting: Hospice and Palliative Medicine

## 2020-05-13 ENCOUNTER — Ambulatory Visit: Payer: 59 | Admitting: Hospice and Palliative Medicine

## 2020-05-13 ENCOUNTER — Other Ambulatory Visit: Payer: Self-pay

## 2020-05-13 VITALS — BP 140/88 | HR 72 | Temp 97.8°F | Resp 16 | Ht 63.5 in | Wt 186.0 lb

## 2020-05-13 DIAGNOSIS — R7301 Impaired fasting glucose: Secondary | ICD-10-CM

## 2020-05-13 DIAGNOSIS — I10 Essential (primary) hypertension: Secondary | ICD-10-CM

## 2020-05-13 DIAGNOSIS — D229 Melanocytic nevi, unspecified: Secondary | ICD-10-CM

## 2020-05-13 DIAGNOSIS — R5383 Other fatigue: Secondary | ICD-10-CM

## 2020-05-13 DIAGNOSIS — J309 Allergic rhinitis, unspecified: Secondary | ICD-10-CM

## 2020-05-13 DIAGNOSIS — Z0001 Encounter for general adult medical examination with abnormal findings: Secondary | ICD-10-CM

## 2020-05-13 DIAGNOSIS — Z124 Encounter for screening for malignant neoplasm of cervix: Secondary | ICD-10-CM | POA: Diagnosis not present

## 2020-05-13 DIAGNOSIS — R300 Dysuria: Secondary | ICD-10-CM

## 2020-05-13 DIAGNOSIS — R7303 Prediabetes: Secondary | ICD-10-CM | POA: Diagnosis not present

## 2020-05-13 LAB — POCT GLYCOSYLATED HEMOGLOBIN (HGB A1C): Hemoglobin A1C: 5.8 % — AB (ref 4.0–5.6)

## 2020-05-13 MED ORDER — CETIRIZINE HCL 10 MG PO TABS: 10.0000 mg | ORAL_TABLET | Freq: Every day | ORAL | 3 refills | Status: DC

## 2020-05-13 MED ORDER — RYBELSUS 3 MG PO TABS: 3.0000 mg | ORAL_TABLET | Freq: Every day | ORAL | 0 refills | Status: DC

## 2020-05-13 NOTE — Progress Notes (Signed)
[facility] [place], [place] [address]  Internal MEDICINE  Office Visit Note  Patient Name: Rhonda Sanders  [id number]  [id number]  Date of Service: 05/15/2020  Chief Complaint  Patient presents with  . Annual Exam  . Hypertension  . Hyperlipidemia     HPI Pt is here for routine health maintenance examination Overall, things have been going well Last visit, A1C 6.3--prediabetic range, has been working on healthy eating habits and increasing her physical activity, but has struggled lately with taking time to cook and meal prep Noted 11 pound weight gain since last visit Concerned about her weight gain  Due for fasting labs Mammogram scheduled for Friday Colonoscopy completed 2017--normal findings, recommend repeat screening 10 years, 2027  Current Medication: Outpatient Encounter Medications as of 05/13/2020  Medication Sig  . fluticasone (FLONASE) 50 MCG/ACT nasal spray Place 2 sprays into both nostrils daily.  . hydrochlorothiazide (HYDRODIURIL) 25 MG tablet Take 1 tablet (25 mg total) by mouth daily.  Marland Kitchen lisinopril (ZESTRIL) 30 MG tablet Take 1 tablet (30 mg total) by mouth daily.  . montelukast (SINGULAIR) 10 MG tablet Take 1 tablet (10 mg total) by mouth at bedtime.  . Multiple Vitamin (MULTIVITAMIN) tablet Take 1 tablet by mouth daily.  . rosuvastatin (CRESTOR) 5 MG tablet Take 1 tablet (5 mg total) by mouth daily.  . Semaglutide (RYBELSUS) 3 MG TABS Take 3 mg by mouth daily.  . [DISCONTINUED] cetirizine (ZYRTEC) 10 MG tablet Take 1 tablet (10 mg total) by mouth daily.  . [DISCONTINUED] Magnesium 500 MG TABS Take by mouth daily.  . [DISCONTINUED] valACYclovir (VALTREX) 1000 MG tablet Take 1 tab po twice a day for 5 days prn for fever blister  . [DISCONTINUED] phentermine (ADIPEX-P) 37.5 MG tablet Take 1 tablet (37.5 mg total) by mouth daily before breakfast. (Patient not taking: Reported on 05/13/2020)   No facility-administered encounter  medications on file as of 05/13/2020.    Surgical History: Past Surgical History:  Procedure Laterality Date  . ABDOMINAL HYSTERECTOMY  04/2006   partial  . BREAST BIOPSY Right 2014   CORE W/CLIP - NEG  . BREAST BIOPSY Right 2015   CORE W/OUT CLIP - NEG COLUMNAR CELL CHANGE WITH MICRO CALCIFICATIONS.   Marland Kitchen BREAST EXCISIONAL BIOPSY Right 2010   EXCISIONAL -ALH, columnar cell hyperplasia  . COLONOSCOPY WITH PROPOFOL N/A 05/26/2015   Procedure: COLONOSCOPY WITH PROPOFOL;  Surgeon: Robert Bellow, MD;  Location: [facility] ENDOSCOPY;  Service: Endoscopy;  Laterality: N/A;    Medical History: Past Medical History:  Diagnosis Date  . Allergy    seasonal   . Atypical lobular hyperplasia of right breast 12/25/2008   Sngle focus noted in area of columnar cell hyperplasia  . Hypercholesterolemia   . Hypertension   . PONV (postoperative nausea and vomiting)     Family History: Family History  Problem Relation Age of Onset  . Breast cancer Mother 33  . Heart disease Father        first MI (45s), died age 42 - [place]  . Heart disease Paternal Grandfather   . Heart disease Paternal Uncle   . CVA Paternal Grandmother   . Breast cancer Maternal Aunt 30  . Crohn's disease Brother   . Stomach cancer Maternal Grandfather   . Colon cancer Neg Hx       Review of Systems  Constitutional: Negative for chills, diaphoresis and fatigue.  HENT: Negative for ear pain, postnasal drip and sinus pressure.   Eyes:  Negative for photophobia, discharge, redness, itching and visual disturbance.  Respiratory: Negative for cough, shortness of breath and wheezing.   Cardiovascular: Negative for chest pain, palpitations and leg swelling.  Gastrointestinal: Negative for abdominal pain, constipation, diarrhea, nausea and vomiting.  Genitourinary: Negative for dysuria and flank pain.  Musculoskeletal: Negative for arthralgias, back pain, gait problem and neck pain.  Skin: Negative for color change.   Allergic/Immunologic: Negative for environmental allergies and food allergies.  Neurological: Negative for dizziness and headaches.  Hematological: Does not bruise/bleed easily.  Psychiatric/Behavioral: Negative for agitation, behavioral problems (depression) and hallucinations.     Vital Signs: BP 140/88   Pulse 72   Temp 97.8 F (36.6 C)   Resp 16   Ht 5' 3.5" (1.613 m)   Wt 186 lb (84.4 kg)   SpO2 98%   BMI 32.43 kg/m    Physical Exam Vitals reviewed.  Constitutional:      Appearance: Normal appearance. She is obese.  Cardiovascular:     Rate and Rhythm: Normal rate and regular rhythm.     Pulses: Normal pulses.     Heart sounds: Normal heart sounds.  Pulmonary:     Effort: Pulmonary effort is normal.     Breath sounds: Normal breath sounds.  Chest:  Breasts:     Right: Normal.     Left: Normal.    Abdominal:     General: Abdomen is flat.     Palpations: Abdomen is soft.  Musculoskeletal:        General: Normal range of motion.     Cervical back: Normal range of motion.  Skin:    General: Skin is warm.  Neurological:     General: No focal deficit present.     Mental Status: She is alert and oriented to person, place, and time. Mental status is at baseline.  Psychiatric:        Mood and Affect: Mood normal.        Behavior: Behavior normal.        Thought Content: Thought content normal.        Judgment: Judgment normal.      LABS: Recent Results (from the past 2160 hour(s))  POCT HgB A1C     Status: Abnormal   Collection Time: 05/13/20  3:42 PM  Result Value Ref Range   Hemoglobin A1C 5.8 (A) 4.0 - 5.6 %   HbA1c POC (<> result, manual entry)     HbA1c, POC (prediabetic range)     HbA1c, POC (controlled diabetic range)    UA/M w/rflx Culture, Routine     Status: None   Collection Time: 05/13/20  4:05 PM   Specimen: Urine   Urine  Result Value Ref Range   Specific Gravity, UA 1.020 1.005 - 1.030   pH, UA 5.0 5.0 - 7.5   Color, UA Yellow  Yellow   Appearance Ur Clear Clear   Leukocytes,UA Negative Negative   Protein,UA Negative Negative/Trace   Glucose, UA Negative Negative   Ketones, UA Negative Negative   RBC, UA Negative Negative   Bilirubin, UA Negative Negative   Urobilinogen, Ur 0.2 0.2 - 1.0 mg/dL   Nitrite, UA Negative Negative   Microscopic Examination Comment     Comment: Microscopic follows if indicated.   Microscopic Examination See below:     Comment: Microscopic was indicated and was performed.   Urinalysis Reflex Comment     Comment: This specimen will not reflex to a Urine Culture.  Microscopic Examination  Status: None   Collection Time: 05/13/20  4:05 PM   Urine  Result Value Ref Range   WBC, UA None seen 0 - 5 /hpf   RBC 0-2 0 - 2 /hpf   Epithelial Cells (non renal) 0-10 0 - 10 /hpf   Casts None seen None seen /lpf   Bacteria, UA None seen None seen/Few    Assessment/Plan: 1. Encounter for routine adult health examination with abnormal findings Well appearing 61 year old female Up to date on PHM Review labs and adjust therapy as indicated - CBC w/Diff/Platelet - Comprehensive Metabolic Panel (CMET) - Lipid Panel With LDL/HDL Ratio - TSH + free T4 - FSH/LH - B12 - Vitamin D (25 hydroxy)  2. Essential hypertension BP and HR well controlled, continue to monitor  3. Prediabetes A1C 5.8--improved Start low dose Rybelsus for further control and weight managment - POCT HgB A1C - Semaglutide (RYBELSUS) 3 MG TABS; Take 3 mg by mouth daily.  Dispense: 90 tablet; Refill: 0  4. Multiple nevi - Ambulatory referral to Dermatology  5. Other fatigue Review labs, consider PSG - CBC w/Diff/Platelet - Comprehensive Metabolic Panel (CMET) - Lipid Panel With LDL/HDL Ratio - TSH + free T4 - FSH/LH - B12 - Vitamin D (25 hydroxy)  6. Dysuria - UA/M w/rflx Culture, Routine - Microscopic Examination  General Counseling: linley moskal understanding of the findings of todays visit and  agrees with plan of treatment. I have discussed any further diagnostic evaluation that may be needed or ordered today. We also reviewed her medications today. she has been encouraged to call the office with any questions or concerns that should arise related to todays visit.    Counseling:    Orders Placed This Encounter  Procedures  . Microscopic Examination  . CBC w/Diff/Platelet  . Comprehensive Metabolic Panel (CMET)  . Lipid Panel With LDL/HDL Ratio  . TSH + free T4  . FSH/LH  . B12  . Vitamin D (25 hydroxy)  . UA/M w/rflx Culture, Routine  . Ambulatory referral to Dermatology  . POCT HgB A1C    Meds ordered this encounter  Medications  . Semaglutide (RYBELSUS) 3 MG TABS    Sig: Take 3 mg by mouth daily.    Dispense:  90 tablet    Refill:  0    Will bring coupon card from [employer].    Total time spent: 30 Minutes  Time spent includes review of chart, medications, test results, and follow up plan with the patient.   This patient was seen by Theodoro Grist AGNP-C Collaboration with Dr Lavera Guise as a part of collaborative care agreement   Tanna Furry. [facility] Internal Medicine

## 2020-05-14 ENCOUNTER — Other Ambulatory Visit: Payer: Self-pay

## 2020-05-14 ENCOUNTER — Ambulatory Visit
Admission: RE | Admit: 2020-05-14 | Discharge: 2020-05-14 | Disposition: A | Discharge status: Home / Self Care | Payer: 59 | Source: Ambulatory Visit | Attending: Hospice and Palliative Medicine | Admitting: Hospice and Palliative Medicine

## 2020-05-14 DIAGNOSIS — Z1231 Encounter for screening mammogram for malignant neoplasm of breast: Secondary | ICD-10-CM | POA: Diagnosis present

## 2020-05-14 LAB — UA/M W/RFLX CULTURE, ROUTINE
Bilirubin, UA: NEGATIVE
Glucose, UA: NEGATIVE
Ketones, UA: NEGATIVE
Leukocytes,UA: NEGATIVE
Nitrite, UA: NEGATIVE
Protein,UA: NEGATIVE
RBC, UA: NEGATIVE
Specific Gravity, UA: 1.02 (ref 1.005–1.030)
Urobilinogen, Ur: 0.2 mg/dL (ref 0.2–1.0)
pH, UA: 5 (ref 5.0–7.5)

## 2020-05-14 LAB — MICROSCOPIC EXAMINATION
Bacteria, UA: NONE SEEN
Casts: NONE SEEN /lpf
WBC, UA: NONE SEEN /hpf (ref 0–5)

## 2020-05-15 ENCOUNTER — Encounter: Payer: Self-pay | Admitting: Hospice and Palliative Medicine

## 2020-06-02 ENCOUNTER — Telehealth: Payer: Self-pay

## 2020-06-02 NOTE — Telephone Encounter (Signed)
Pt advised that samples ready for pickup at front for [facility]

## 2020-08-16 ENCOUNTER — Ambulatory Visit: Payer: 59 | Admitting: Nurse Practitioner

## 2020-08-17 ENCOUNTER — Ambulatory Visit: Payer: Self-pay | Admitting: Nurse Practitioner

## 2020-08-17 ENCOUNTER — Other Ambulatory Visit: Payer: Self-pay

## 2020-08-17 ENCOUNTER — Encounter: Payer: Self-pay | Admitting: Nurse Practitioner

## 2020-08-17 VITALS — BP 148/94 | HR 81 | Temp 98.4°F | Resp 16 | Ht 64.0 in | Wt 192.0 lb

## 2020-08-17 DIAGNOSIS — E669 Obesity, unspecified: Secondary | ICD-10-CM | POA: Diagnosis not present

## 2020-08-17 DIAGNOSIS — N951 Menopausal and female climacteric states: Secondary | ICD-10-CM

## 2020-08-17 DIAGNOSIS — I10 Essential (primary) hypertension: Secondary | ICD-10-CM | POA: Diagnosis not present

## 2020-08-17 DIAGNOSIS — Z6832 Body mass index (BMI) 32.0-32.9, adult: Secondary | ICD-10-CM

## 2020-08-17 DIAGNOSIS — R7303 Prediabetes: Secondary | ICD-10-CM | POA: Diagnosis not present

## 2020-08-17 LAB — POCT GLYCOSYLATED HEMOGLOBIN (HGB A1C): Hemoglobin A1C: 6 % — AB (ref 4.0–5.6)

## 2020-08-17 MED ORDER — VENLAFAXINE HCL ER 37.5 MG PO CP24: 37.5000 mg | ORAL_CAPSULE | Freq: Every day | ORAL | 0 refills | Status: DC

## 2020-08-17 MED ORDER — RYBELSUS 3 MG PO TABS: 3.0000 mg | ORAL_TABLET | Freq: Every day | ORAL | 0 refills | Status: DC

## 2020-08-17 NOTE — Progress Notes (Signed)
[facility] [place], [place] [address]  Internal MEDICINE  Office Visit Note  Patient Name: Rhonda Sanders  [id number]  [id number]  Date of Service: 08/25/2020  Chief Complaint  Patient presents with   Follow-up    A1C, med review, pt never received Rybelsus,    Diabetes    HPI Rhonda Sanders presents for a follow up visit for diabetes, A1C check and medication review. She was previously supposed to start on Rybelsus but never received the prescription. Her A1C in march 2022 was 5.8. Today's A1C is 6.0 which was an increase of 0.2. She is also reporting vasomotor symptoms of menopause that are bothering her including hot flashes and night sweats. Discussed medications that can improve vasomotor symptoms and patient showed interest.  -patient is still interested in trying Rybelsus for prediabetes and weight loss.     Current Medication: Outpatient Encounter Medications as of 08/17/2020  Medication Sig   cetirizine (ZYRTEC) 10 MG tablet Take 1 tablet (10 mg total) by mouth daily.   fluticasone (FLONASE) 50 MCG/ACT nasal spray Place 2 sprays into both nostrils daily.   hydrochlorothiazide (HYDRODIURIL) 25 MG tablet Take 1 tablet (25 mg total) by mouth daily.   lisinopril (ZESTRIL) 30 MG tablet Take 1 tablet (30 mg total) by mouth daily.   montelukast (SINGULAIR) 10 MG tablet Take 1 tablet (10 mg total) by mouth at bedtime.   Multiple Vitamin (MULTIVITAMIN) tablet Take 1 tablet by mouth daily.   rosuvastatin (CRESTOR) 5 MG tablet Take 1 tablet (5 mg total) by mouth daily.   Semaglutide (RYBELSUS) 3 MG TABS Take 3 mg by mouth daily before breakfast. With no more than 4 oz of water, wait at least 30 minutes before eating, drinking or taking other medications.   venlafaxine XR (EFFEXOR-XR) 37.5 MG 24 hr capsule Take 1 capsule (37.5 mg total) by mouth daily with breakfast.   [DISCONTINUED] Semaglutide (RYBELSUS) 3 MG TABS Take 3 mg by mouth daily. (Patient not taking:  Reported on 08/17/2020)   No facility-administered encounter medications on file as of 08/17/2020.    Surgical History: Past Surgical History:  Procedure Laterality Date   ABDOMINAL HYSTERECTOMY  04/2006   partial   BREAST BIOPSY Right 2014   CORE W/CLIP - NEG   BREAST BIOPSY Right 2015   CORE W/OUT CLIP - NEG COLUMNAR CELL CHANGE WITH MICRO CALCIFICATIONS.    BREAST EXCISIONAL BIOPSY Right 2010   EXCISIONAL -ALH, columnar cell hyperplasia   COLONOSCOPY WITH PROPOFOL N/A 05/26/2015   Procedure: COLONOSCOPY WITH PROPOFOL;  Surgeon: Robert Bellow, MD;  Location: [facility] ENDOSCOPY;  Service: Endoscopy;  Laterality: N/A;    Medical History: Past Medical History:  Diagnosis Date   Allergy    seasonal    Atypical lobular hyperplasia of right breast 12/25/2008   Sngle focus noted in area of columnar cell hyperplasia   Hypercholesterolemia    Hypertension    PONV (postoperative nausea and vomiting)     Family History: Family History  Problem Relation Age of Onset   Breast cancer Mother 51   Heart disease Father        first MI (39s), died age 90 - MI   Heart disease Paternal Grandfather    Heart disease Paternal Uncle    CVA Paternal Grandmother    Breast cancer Maternal Aunt 84   Crohn's disease Brother    Stomach cancer Maternal Grandfather    Colon cancer Neg Hx     Social History  Socioeconomic History   Marital status: Single    Spouse name: Not on file   Number of children: 0   Years of education: Not on file   Highest education level: Not on file  Occupational History   Not on file  Tobacco Use   Smoking status: Never   Smokeless tobacco: Never  Vaping Use   Vaping Use: Never used  Substance and Sexual Activity   Alcohol use: Yes    Alcohol/week: 0.0 standard drinks    Comment: occasionally   Drug use: No   Sexual activity: Not on file  Other Topics Concern   Not on file  Social History Narrative   Not on file   Social Determinants of Health    Financial Resource Strain: Not on file  Food Insecurity: Not on file  Transportation Needs: Not on file  Physical Activity: Not on file  Stress: Not on file  Social Connections: Not on file  Intimate Partner Violence: Not on file      Review of Systems  Constitutional:  Negative for chills, fatigue and unexpected weight change.  HENT:  Negative for congestion, rhinorrhea, sneezing and sore throat.   Eyes:  Negative for redness.  Respiratory:  Negative for cough, chest tightness and shortness of breath.   Cardiovascular:  Negative for chest pain and palpitations.  Gastrointestinal:  Negative for abdominal pain, constipation, diarrhea, nausea and vomiting.  Endocrine: Positive for heat intolerance (hot flashes and night sweats).  Genitourinary:  Negative for dysuria and frequency.  Musculoskeletal:  Negative for arthralgias, back pain, joint swelling and neck pain.  Skin:  Negative for rash.  Neurological: Negative.  Negative for tremors and numbness.  Hematological:  Negative for adenopathy. Does not bruise/bleed easily.  Psychiatric/Behavioral:  Negative for behavioral problems (Depression), sleep disturbance and suicidal ideas. The patient is not nervous/anxious.    Vital Signs: BP (!) 148/94   Pulse 81   Temp 98.4 F (36.9 C)   Resp 16   Ht 5\' 4"  (1.626 m)   Wt 192 lb (87.1 kg)   SpO2 99%   BMI 32.96 kg/m    Physical Exam Vitals reviewed.  Constitutional:      General: She is not in acute distress.    Appearance: Normal appearance. She is well-developed. She is obese. She is not ill-appearing or diaphoretic.  HENT:     Head: Normocephalic and atraumatic.  Neck:     Thyroid: No thyromegaly.     Vascular: No JVD.     Trachea: No tracheal deviation.  Cardiovascular:     Rate and Rhythm: Normal rate and regular rhythm.     Pulses: Normal pulses.     Heart sounds: Normal heart sounds. No murmur heard.   No friction rub. No gallop.  Pulmonary:     Effort:  Pulmonary effort is normal. No respiratory distress.     Breath sounds: Normal breath sounds. No wheezing or rales.  Chest:     Chest wall: No tenderness.  Skin:    General: Skin is warm and dry.     Capillary Refill: Capillary refill takes less than 2 seconds.  Neurological:     Mental Status: She is alert and oriented to person, place, and time.  Psychiatric:        Mood and Affect: Mood normal.        Behavior: Behavior normal.    Assessment/Plan: 1. Prediabetes Prediabetes with A1C of 6.0 today, was previously prescribed rybelsus but was never able to  fill the prescription. Rybelsus reordered.  - POCT glycosylated hemoglobin (Hb A1C) - Semaglutide (RYBELSUS) 3 MG TABS; Take 3 mg by mouth daily before breakfast. With no more than 4 oz of water, wait at least 30 minutes before eating, drinking or taking other medications.  Dispense: 30 tablet; Refill: 0  2. Essential hypertension Blood pressure is elevated today, patient reports she has increased stress level and her blood pressure is usually lower at home. She is taking lisinopril and hydrochlorothiazide. Will consider adjusting blood pressure medication if her blood pressure is elevated at her next office visit.   3. Vasomotor symptoms due to menopause Patient has hot flashes and night sweats on a daily basis, she is interested in trying venlafaxine to see if it helps her vasomotor symptoms to improve.  - venlafaxine XR (EFFEXOR-XR) 37.5 MG 24 hr capsule; Take 1 capsule (37.5 mg total) by mouth daily with breakfast.  Dispense: 30 capsule; Refill: 0  4. Class 1 obesity with body mass index (BMI) of 32.0 to 32.9 in adult, unspecified obesity type, unspecified whether serious comorbidity present Patient is prediabetic and obese. Was previously prescribed Rybelsus but was never able to fill the presciption. Rybelsus reordered, follow up in 4 weeks to evaluate weight loss, and possibly increase dose.  - Semaglutide (RYBELSUS) 3 MG TABS;  Take 3 mg by mouth daily before breakfast. With no more than 4 oz of water, wait at least 30 minutes before eating, drinking or taking other medications.  Dispense: 30 tablet; Refill: 0   General Counseling: shikita vaillancourt understanding of the findings of todays visit and agrees with plan of treatment. I have discussed any further diagnostic evaluation that may be needed or ordered today. We also reviewed her medications today. she has been encouraged to call the office with any questions or concerns that should arise related to todays visit.    Orders Placed This Encounter  Procedures   POCT glycosylated hemoglobin (Hb A1C)    Meds ordered this encounter  Medications   Semaglutide (RYBELSUS) 3 MG TABS    Sig: Take 3 mg by mouth daily before breakfast. With no more than 4 oz of water, wait at least 30 minutes before eating, drinking or taking other medications.    Dispense:  30 tablet    Refill:  0   venlafaxine XR (EFFEXOR-XR) 37.5 MG 24 hr capsule    Sig: Take 1 capsule (37.5 mg total) by mouth daily with breakfast.    Dispense:  30 capsule    Refill:  0    Return in about 4 weeks (around 09/14/2020) for F/U, Weight loss and menopausal symptoms.   Total time spent:30 Minutes Time spent includes review of chart, medications, test results, and follow up plan with the patient.   [place] Controlled Substance Database was reviewed by me.  This patient was seen by Jonetta Osgood, FNP-C in collaboration with Dr. Clayborn Bigness as a part of collaborative care agreement.   Awa Bachicha R. Valetta Fuller, MSN, FNP-C Internal medicine

## 2020-08-25 DIAGNOSIS — R7303 Prediabetes: Secondary | ICD-10-CM | POA: Insufficient documentation

## 2020-08-25 DIAGNOSIS — N951 Menopausal and female climacteric states: Secondary | ICD-10-CM | POA: Insufficient documentation

## 2020-09-16 ENCOUNTER — Other Ambulatory Visit: Payer: Self-pay | Admitting: Nurse Practitioner

## 2020-09-16 ENCOUNTER — Telehealth: Payer: Self-pay

## 2020-09-16 DIAGNOSIS — N951 Menopausal and female climacteric states: Secondary | ICD-10-CM

## 2020-09-16 NOTE — Telephone Encounter (Signed)
Resent PA on Rybelsus 3 mg

## 2020-10-05 ENCOUNTER — Ambulatory Visit: Payer: BC Managed Care – PPO | Admitting: Nurse Practitioner

## 2020-10-05 ENCOUNTER — Other Ambulatory Visit: Payer: Self-pay

## 2020-10-05 ENCOUNTER — Encounter: Payer: Self-pay | Admitting: Nurse Practitioner

## 2020-10-05 VITALS — BP 142/80 | HR 98 | Temp 97.4°F | Resp 16 | Ht 63.5 in | Wt 190.4 lb

## 2020-10-05 DIAGNOSIS — Z6832 Body mass index (BMI) 32.0-32.9, adult: Secondary | ICD-10-CM

## 2020-10-05 DIAGNOSIS — N951 Menopausal and female climacteric states: Secondary | ICD-10-CM

## 2020-10-05 DIAGNOSIS — Z23 Encounter for immunization: Secondary | ICD-10-CM

## 2020-10-05 DIAGNOSIS — I10 Essential (primary) hypertension: Secondary | ICD-10-CM | POA: Diagnosis not present

## 2020-10-05 DIAGNOSIS — R7303 Prediabetes: Secondary | ICD-10-CM | POA: Diagnosis not present

## 2020-10-05 DIAGNOSIS — E669 Obesity, unspecified: Secondary | ICD-10-CM

## 2020-10-05 MED ORDER — ZOSTER VAC RECOMB ADJUVANTED 50 MCG/0.5ML IM SUSR: 0.5000 mL | Freq: Once | INTRAMUSCULAR | 0 refills | Status: AC

## 2020-10-05 MED ORDER — METFORMIN HCL 500 MG PO TABS
500.0000 mg | ORAL_TABLET | Freq: Two times a day (BID) | ORAL | 0 refills | Status: DC
Start: 2020-10-05 — End: 2020-12-07

## 2020-10-05 MED ORDER — VENLAFAXINE HCL 75 MG PO TABS: 75.0000 mg | ORAL_TABLET | Freq: Two times a day (BID) | ORAL | 2 refills | Status: DC

## 2020-10-05 MED ORDER — LISINOPRIL 40 MG PO TABS: 40.0000 mg | ORAL_TABLET | Freq: Every day | ORAL | 1 refills | Status: DC

## 2020-10-05 MED ORDER — TETANUS-DIPHTH-ACELL PERTUSSIS 5-2.5-18.5 LF-MCG/0.5 IM SUSP: 0.5000 mL | Freq: Once | INTRAMUSCULAR | 0 refills | Status: AC

## 2020-10-05 MED ORDER — RYBELSUS 3 MG PO TABS: 3.0000 mg | ORAL_TABLET | Freq: Every day | ORAL | 0 refills | Status: DC

## 2020-10-05 NOTE — Progress Notes (Signed)
[facility] [place], [place] [address]  Internal MEDICINE  Office Visit Note  Patient Name: Rhonda Sanders  [id number]  [id number]  Date of Service: 10/05/2020  Chief Complaint  Patient presents with   Follow-up    rybelsus PA was denied, BP, Weight loss, and menopausal symptoms   Hyperlipidemia   Hypertension    HPI Dazani presents for a follow visit for weight loss and menopausal symptoms. She has prediabetes and impaired fasting glucose. Her Rybelsus prescription prior authorization was previously denied.  She has lost 2 lbs since her last office visit  She was started on venlafaxine last office visit, she reports that this medication has been helping with her hot flashes and she would like to continue at the 75 mg daily.     Current Medication: Outpatient Encounter Medications as of 10/05/2020  Medication Sig   cetirizine (ZYRTEC) 10 MG tablet Take 1 tablet (10 mg total) by mouth daily.   fluticasone (FLONASE) 50 MCG/ACT nasal spray Place 2 sprays into both nostrils daily.   hydrochlorothiazide (HYDRODIURIL) 25 MG tablet Take 1 tablet (25 mg total) by mouth daily.   lisinopril (ZESTRIL) 40 MG tablet Take 1 tablet (40 mg total) by mouth daily.   metFORMIN (GLUCOPHAGE) 500 MG tablet Take 1 tablet (500 mg total) by mouth 2 (two) times daily with a meal.   montelukast (SINGULAIR) 10 MG tablet Take 1 tablet (10 mg total) by mouth at bedtime.   Multiple Vitamin (MULTIVITAMIN) tablet Take 1 tablet by mouth daily.   rosuvastatin (CRESTOR) 5 MG tablet Take 1 tablet (5 mg total) by mouth daily.   venlafaxine (EFFEXOR) 75 MG tablet Take 1 tablet (75 mg total) by mouth 2 (two) times daily.   [DISCONTINUED] lisinopril (ZESTRIL) 30 MG tablet Take 1 tablet (30 mg total) by mouth daily.   [DISCONTINUED] Tdap (BOOSTRIX) 5-2.5-18.5 LF-MCG/0.5 injection Inject 0.5 mLs into the muscle once.   [DISCONTINUED] venlafaxine XR (EFFEXOR-XR) 37.5 MG 24 hr capsule TAKE 1 CAPSULE BY  MOUTH ONCE DAILY WITH BREAKFAST   [DISCONTINUED] Zoster Vaccine Adjuvanted [facility]) injection Inject 0.5 mLs into the muscle once.   Semaglutide (RYBELSUS) 3 MG TABS Take 3 mg by mouth daily before breakfast. With no more than 4 oz of water, wait at least 30 minutes before eating, drinking or taking other medications.   [EXPIRED] Tdap (BOOSTRIX) 5-2.5-18.5 LF-MCG/0.5 injection Inject 0.5 mLs into the muscle once for 1 dose.   [EXPIRED] Zoster Vaccine Adjuvanted [facility]) injection Inject 0.5 mLs into the muscle once for 1 dose.   [DISCONTINUED] Semaglutide (RYBELSUS) 3 MG TABS Take 3 mg by mouth daily before breakfast. With no more than 4 oz of water, wait at least 30 minutes before eating, drinking or taking other medications. (Patient not taking: Reported on 10/05/2020)   No facility-administered encounter medications on file as of 10/05/2020.    Surgical History: Past Surgical History:  Procedure Laterality Date   ABDOMINAL HYSTERECTOMY  04/2006   partial   BREAST BIOPSY Right 2014   CORE W/CLIP - NEG   BREAST BIOPSY Right 2015   CORE W/OUT CLIP - NEG COLUMNAR CELL CHANGE WITH MICRO CALCIFICATIONS.    BREAST EXCISIONAL BIOPSY Right 2010   EXCISIONAL -ALH, columnar cell hyperplasia   COLONOSCOPY WITH PROPOFOL N/A 05/26/2015   Procedure: COLONOSCOPY WITH PROPOFOL;  Surgeon: Robert Bellow, MD;  Location: [facility] ENDOSCOPY;  Service: Endoscopy;  Laterality: N/A;    Medical History: Past Medical History:  Diagnosis Date  Actinic keratosis 07/18/2012   R distal med pretibial   Allergy    seasonal    Atypical lobular hyperplasia of right breast 12/25/2008   Sngle focus noted in area of columnar cell hyperplasia   Basal cell carcinoma 06/19/2006   sternum   BCC (basal cell carcinoma of skin) 05/17/2009   L lat chest sup to breast   BCC (basal cell carcinoma of skin) 05/17/2009   R med mid calf   BCC (basal cell carcinoma of skin) 06/02/2013   R sup distal lat pretibial   BCC  (basal cell carcinoma of skin) 06/02/2013   R inf distal lat pretibial   BCC (basal cell carcinoma of skin) 12/08/2015   R inf med knee   BCC (basal cell carcinoma of skin) 12/08/2015   R mid to distal lat pretibial   BCC (basal cell carcinoma of skin) 05/03/2016   L lat pretibial below knee   BCC (basal cell carcinoma of skin) 05/03/2016   L prox calf   BCC (basal cell carcinoma of skin) 05/03/2016   R prox calf prox   BCC (basal cell carcinoma of skin) 05/03/2016   R prox calf distal   Hypercholesterolemia    Hypertension    PONV (postoperative nausea and vomiting)     Family History: Family History  Problem Relation Age of Onset   Breast cancer Mother 53   Heart disease Father        first MI (29s), died age 55 - MI   Heart disease Paternal Grandfather    Heart disease Paternal Uncle    CVA Paternal Grandmother    Breast cancer Maternal Aunt 60   Crohn's disease Brother    Stomach cancer Maternal Grandfather    Colon cancer Neg Hx     Social History   Socioeconomic History   Marital status: Single    Spouse name: Not on file   Number of children: 0   Years of education: Not on file   Highest education level: Not on file  Occupational History   Not on file  Tobacco Use   Smoking status: Never   Smokeless tobacco: Never  Vaping Use   Vaping Use: Never used  Substance and Sexual Activity   Alcohol use: Yes    Alcohol/week: 0.0 standard drinks    Comment: occasionally   Drug use: No   Sexual activity: Not on file  Other Topics Concern   Not on file  Social History Narrative   Not on file   Social Determinants of Health   Financial Resource Strain: Not on file  Food Insecurity: Not on file  Transportation Needs: Not on file  Physical Activity: Not on file  Stress: Not on file  Social Connections: Not on file  Intimate Partner Violence: Not on file      Review of Systems  Constitutional:  Negative for chills, fatigue and unexpected weight change.   HENT:  Negative for congestion, rhinorrhea, sneezing and sore throat.   Eyes:  Negative for redness.  Respiratory:  Negative for cough, chest tightness and shortness of breath.   Cardiovascular:  Negative for chest pain and palpitations.  Gastrointestinal:  Negative for abdominal pain, constipation, diarrhea, nausea and vomiting.  Genitourinary:  Negative for dysuria and frequency.  Musculoskeletal:  Negative for arthralgias, back pain, joint swelling and neck pain.  Skin:  Negative for rash.  Neurological: Negative.  Negative for tremors and numbness.  Hematological:  Negative for adenopathy. Does not bruise/bleed easily.  Psychiatric/Behavioral:  Negative for behavioral problems (Depression), sleep disturbance and suicidal ideas. The patient is not nervous/anxious.    Vital Signs: BP (!) 142/80 Comment: 144/86  Pulse 98   Temp (!) 97.4 F (36.3 C)   Resp 16   Ht 5' 3.5" (1.613 m)   Wt 190 lb 6.4 oz (86.4 kg)   SpO2 97%   BMI 33.20 kg/m    Physical Exam Vitals reviewed.  Constitutional:      General: She is not in acute distress.    Appearance: Normal appearance. She is not ill-appearing.  HENT:     Head: Normocephalic and atraumatic.  Cardiovascular:     Rate and Rhythm: Normal rate and regular rhythm.  Pulmonary:     Effort: Pulmonary effort is normal. No respiratory distress.  Skin:    General: Skin is warm and dry.     Capillary Refill: Capillary refill takes less than 2 seconds.  Neurological:     Mental Status: She is alert and oriented to person, place, and time.  Psychiatric:        Mood and Affect: Mood normal.        Behavior: Behavior normal.     Assessment/Plan: 1. Prediabetes Metformin prescribed and Rybelsus reordered.  - metFORMIN (GLUCOPHAGE) 500 MG tablet; Take 1 tablet (500 mg total) by mouth 2 (two) times daily with a meal.  Dispense: 30 tablet; Refill: 0 - Semaglutide (RYBELSUS) 3 MG TABS; Take 3 mg by mouth daily before breakfast. With no  more than 4 oz of water, wait at least 30 minutes before eating, drinking or taking other medications.  Dispense: 30 tablet; Refill: 0  2. Class 1 obesity with body mass index (BMI) of 32.0 to 32.9 in adult, unspecified obesity type, unspecified whether serious comorbidity present Reorder Rybelsus to attempt PA again.  - Semaglutide (RYBELSUS) 3 MG TABS; Take 3 mg by mouth daily before breakfast. With no more than 4 oz of water, wait at least 30 minutes before eating, drinking or taking other medications.  Dispense: 30 tablet; Refill: 0  3. Essential hypertension Blood pressure is elevated, lisinopril increased to 40 mg daily. Follow up in 4 weeks.  - lisinopril (ZESTRIL) 40 MG tablet; Take 1 tablet (40 mg total) by mouth daily.  Dispense: 90 tablet; Refill: 1  4. Vasomotor symptoms due to menopause Refill ordered.  - venlafaxine (EFFEXOR) 75 MG tablet; Take 1 tablet (75 mg total) by mouth 2 (two) times daily.  Dispense: 30 tablet; Refill: 2  5. Encounter for vaccination Orders sent to pharmacy - Tdap (Chauncey) 5-2.5-18.5 LF-MCG/0.5 injection; Inject 0.5 mLs into the muscle once for 1 dose.  Dispense: 0.5 mL; Refill: 0 - Zoster Vaccine Adjuvanted [facility]) injection; Inject 0.5 mLs into the muscle once for 1 dose.  Dispense: 0.5 mL; Refill: 0   General Counseling: elvine lauinger understanding of the findings of todays visit and agrees with plan of treatment. I have discussed any further diagnostic evaluation that may be needed or ordered today. We also reviewed her medications today. she has been encouraged to call the office with any questions or concerns that should arise related to todays visit.    No orders of the defined types were placed in this encounter.   Meds ordered this encounter  Medications   Tdap (BOOSTRIX) 5-2.5-18.5 LF-MCG/0.5 injection    Sig: Inject 0.5 mLs into the muscle once for 1 dose.    Dispense:  0.5 mL    Refill:  0   Zoster  Vaccine Adjuvanted  [facility] injection    Sig: Inject 0.5 mLs into the muscle once for 1 dose.    Dispense:  0.5 mL    Refill:  0   metFORMIN (GLUCOPHAGE) 500 MG tablet    Sig: Take 1 tablet (500 mg total) by mouth 2 (two) times daily with a meal.    Dispense:  30 tablet    Refill:  0   venlafaxine (EFFEXOR) 75 MG tablet    Sig: Take 1 tablet (75 mg total) by mouth 2 (two) times daily.    Dispense:  30 tablet    Refill:  2   Semaglutide (RYBELSUS) 3 MG TABS    Sig: Take 3 mg by mouth daily before breakfast. With no more than 4 oz of water, wait at least 30 minutes before eating, drinking or taking other medications.    Dispense:  30 tablet    Refill:  0   lisinopril (ZESTRIL) 40 MG tablet    Sig: Take 1 tablet (40 mg total) by mouth daily.    Dispense:  90 tablet    Refill:  1    Return in about 4 weeks (around 11/02/2020) for F/U, BP check, Samuell Knoble PCP.   Total time spent:30 Minutes Time spent includes review of chart, medications, test results, and follow up plan with the patient.   [place] Controlled Substance Database was reviewed by me.  This patient was seen by Jonetta Osgood, FNP-C in collaboration with Dr. Clayborn Bigness as a part of collaborative care agreement.   Danasha Melman R. Valetta Fuller, MSN, FNP-C Internal medicine

## 2020-10-07 ENCOUNTER — Telehealth: Payer: Self-pay

## 2020-10-07 NOTE — Telephone Encounter (Signed)
PA for RYBELSUS 3 mg was denied.

## 2020-11-02 ENCOUNTER — Ambulatory Visit: Payer: BC Managed Care – PPO | Admitting: Nurse Practitioner

## 2020-11-02 ENCOUNTER — Encounter: Payer: Self-pay | Admitting: Nurse Practitioner

## 2020-11-02 ENCOUNTER — Other Ambulatory Visit: Payer: Self-pay

## 2020-11-02 VITALS — BP 150/84 | HR 95 | Temp 97.5°F | Resp 16 | Ht 63.5 in | Wt 187.8 lb

## 2020-11-02 DIAGNOSIS — N951 Menopausal and female climacteric states: Secondary | ICD-10-CM | POA: Diagnosis not present

## 2020-11-02 DIAGNOSIS — E669 Obesity, unspecified: Secondary | ICD-10-CM | POA: Diagnosis not present

## 2020-11-02 DIAGNOSIS — R5383 Other fatigue: Secondary | ICD-10-CM | POA: Diagnosis not present

## 2020-11-02 DIAGNOSIS — I10 Essential (primary) hypertension: Secondary | ICD-10-CM | POA: Diagnosis not present

## 2020-11-02 DIAGNOSIS — R7303 Prediabetes: Secondary | ICD-10-CM

## 2020-11-02 DIAGNOSIS — Z6832 Body mass index (BMI) 32.0-32.9, adult: Secondary | ICD-10-CM

## 2020-11-02 MED ORDER — AMLODIPINE BESYLATE 5 MG PO TABS: 5.0000 mg | ORAL_TABLET | Freq: Every day | ORAL | 0 refills | Status: DC

## 2020-11-02 NOTE — Progress Notes (Signed)
[facility] [place], [place] [address]  Internal MEDICINE  Office Visit Note  Patient Name: Rhonda Sanders  [id number]  [id number]  Date of Service: 11/02/2020  Chief Complaint  Patient presents with   Follow-up    Discuss meds   Hyperlipidemia   Hypertension   Quality Metric Gaps    Shingrix and tdap sent to pharmacy last visit, next pap will be done here     HPI Rhonda Sanders presents for a follow up visit for medication review and hypertension. Her blood pressure is not well controlled with current medication. Since her previous office visit, she was able to get the metformin and Rybelsus from her pharmacy and the Rybelsus was not expensive.  Her venlafaxine dose was increased to 75 mg at her previous office visit and her vasomotor symptoms have greatly improved. She reports that she may still have hot flashes sometimes but they are very mild.     Current Medication: Outpatient Encounter Medications as of 11/02/2020  Medication Sig   amLODipine (NORVASC) 5 MG tablet Take 1 tablet (5 mg total) by mouth daily.   cetirizine (ZYRTEC) 10 MG tablet Take 1 tablet (10 mg total) by mouth daily.   fluticasone (FLONASE) 50 MCG/ACT nasal spray Place 2 sprays into both nostrils daily.   hydrochlorothiazide (HYDRODIURIL) 25 MG tablet Take 1 tablet (25 mg total) by mouth daily.   lisinopril (ZESTRIL) 40 MG tablet Take 1 tablet (40 mg total) by mouth daily.   metFORMIN (GLUCOPHAGE) 500 MG tablet Take 1 tablet (500 mg total) by mouth 2 (two) times daily with a meal.   montelukast (SINGULAIR) 10 MG tablet Take 1 tablet (10 mg total) by mouth at bedtime.   Multiple Vitamin (MULTIVITAMIN) tablet Take 1 tablet by mouth daily.   rosuvastatin (CRESTOR) 5 MG tablet Take 1 tablet (5 mg total) by mouth daily.   Semaglutide (RYBELSUS) 3 MG TABS Take 3 mg by mouth daily before breakfast. With no more than 4 oz of water, wait at least 30 minutes before eating, drinking or taking other  medications.   venlafaxine (EFFEXOR) 75 MG tablet Take 1 tablet (75 mg total) by mouth 2 (two) times daily.   No facility-administered encounter medications on file as of 11/02/2020.    Surgical History: Past Surgical History:  Procedure Laterality Date   ABDOMINAL HYSTERECTOMY  04/2006   partial   BREAST BIOPSY Right 2014   CORE W/CLIP - NEG   BREAST BIOPSY Right 2015   CORE W/OUT CLIP - NEG COLUMNAR CELL CHANGE WITH MICRO CALCIFICATIONS.    BREAST EXCISIONAL BIOPSY Right 2010   EXCISIONAL -ALH, columnar cell hyperplasia   COLONOSCOPY WITH PROPOFOL N/A 05/26/2015   Procedure: COLONOSCOPY WITH PROPOFOL;  Surgeon: Robert Bellow, MD;  Location: [facility] ENDOSCOPY;  Service: Endoscopy;  Laterality: N/A;    Medical History: Past Medical History:  Diagnosis Date   Actinic keratosis 07/18/2012   R distal med pretibial   Allergy    seasonal    Atypical lobular hyperplasia of right breast 12/25/2008   Sngle focus noted in area of columnar cell hyperplasia   Basal cell carcinoma 06/19/2006   sternum   BCC (basal cell carcinoma of skin) 05/17/2009   L lat chest sup to breast   BCC (basal cell carcinoma of skin) 05/17/2009   R med mid calf   BCC (basal cell carcinoma of skin) 06/02/2013   R sup distal lat pretibial   BCC (basal cell carcinoma of skin) 06/02/2013  R inf distal lat pretibial   BCC (basal cell carcinoma of skin) 12/08/2015   R inf med knee   BCC (basal cell carcinoma of skin) 12/08/2015   R mid to distal lat pretibial   BCC (basal cell carcinoma of skin) 05/03/2016   L lat pretibial below knee   BCC (basal cell carcinoma of skin) 05/03/2016   L prox calf   BCC (basal cell carcinoma of skin) 05/03/2016   R prox calf prox   BCC (basal cell carcinoma of skin) 05/03/2016   R prox calf distal   Hypercholesterolemia    Hypertension    PONV (postoperative nausea and vomiting)     Family History: Family History  Problem Relation Age of Onset   Breast cancer Mother  37   Heart disease Father        first MI (62s), died age 49 - MI   Heart disease Paternal Grandfather    Heart disease Paternal Uncle    CVA Paternal Grandmother    Breast cancer Maternal Aunt 40   Crohn's disease Brother    Stomach cancer Maternal Grandfather    Colon cancer Neg Hx     Social History   Socioeconomic History   Marital status: Single    Spouse name: Not on file   Number of children: 0   Years of education: Not on file   Highest education level: Not on file  Occupational History   Not on file  Tobacco Use   Smoking status: Never   Smokeless tobacco: Never  Vaping Use   Vaping Use: Never used  Substance and Sexual Activity   Alcohol use: Yes    Alcohol/week: 0.0 standard drinks    Comment: occasionally   Drug use: No   Sexual activity: Not on file  Other Topics Concern   Not on file  Social History Narrative   Not on file   Social Determinants of Health   Financial Resource Strain: Not on file  Food Insecurity: Not on file  Transportation Needs: Not on file  Physical Activity: Not on file  Stress: Not on file  Social Connections: Not on file  Intimate Partner Violence: Not on file      Review of Systems  Constitutional:  Negative for chills, fatigue and unexpected weight change.  HENT:  Negative for congestion, rhinorrhea, sneezing and sore throat.   Eyes:  Negative for redness.  Respiratory:  Negative for cough, chest tightness and shortness of breath.   Cardiovascular:  Negative for chest pain and palpitations.  Gastrointestinal:  Negative for abdominal pain, constipation, diarrhea, nausea and vomiting.  Genitourinary:  Negative for dysuria and frequency.  Musculoskeletal:  Negative for arthralgias, back pain, joint swelling and neck pain.  Skin:  Negative for rash.  Neurological: Negative.  Negative for tremors and numbness.  Hematological:  Negative for adenopathy. Does not bruise/bleed easily.  Psychiatric/Behavioral:  Negative for  behavioral problems (Depression), sleep disturbance and suicidal ideas. The patient is not nervous/anxious.    Vital Signs: BP (!) 150/84 Comment: 155/80  Pulse 95   Temp (!) 97.5 F (36.4 C)   Resp 16   Ht 5' 3.5" (1.613 m)   Wt 187 lb 12.8 oz (85.2 kg)   SpO2 97%   BMI 32.75 kg/m    Physical Exam Vitals reviewed.  Constitutional:      General: She is not in acute distress.    Appearance: Normal appearance. She is obese. She is not ill-appearing.  HENT:  Head: Normocephalic and atraumatic.  Eyes:     Extraocular Movements: Extraocular movements intact.     Pupils: Pupils are equal, round, and reactive to light.  Cardiovascular:     Rate and Rhythm: Normal rate and regular rhythm.  Pulmonary:     Effort: Pulmonary effort is normal. No respiratory distress.  Neurological:     Mental Status: She is alert and oriented to person, place, and time.     Cranial Nerves: No cranial nerve deficit.     Coordination: Coordination normal.     Gait: Gait normal.  Psychiatric:        Mood and Affect: Mood normal.        Behavior: Behavior normal.     Assessment/Plan: 1. Essential hypertension Add amlodipine 5 mg daily to help control blood pressure. Follow up in 4 weeks.  - amLODipine (NORVASC) 5 MG tablet; Take 1 tablet (5 mg total) by mouth daily.  Dispense: 90 tablet; Refill: 0  2. Prediabetes Stable, patient started taking Rybelsus. She has metformin at home but has not started it yet.   3. Vasomotor symptoms due to menopause Improved with current dose of 75 mg venlafaxine daily, continue as prescribed.  4. Class 1 obesity with body mass index (BMI) of 32.0 to 32.9 in adult, unspecified obesity type, unspecified whether serious comorbidity present She was able to get Rybelsus from her pharmacy as prescribed for prediabetes and obesity. She plans to take Rybelsus as an adjunct therapy to her current diet and lifestyle modifications that were discussed in detail at today's  visit. She has lost 5 lbs since her office visit in June.    General Counseling: Rhonda Sanders understanding of the findings of todays visit and agrees with plan of treatment. I have discussed any further diagnostic evaluation that may be needed or ordered today. We also reviewed her medications today. she has been encouraged to call the office with any questions or concerns that should arise related to todays visit.    No orders of the defined types were placed in this encounter.   Meds ordered this encounter  Medications   amLODipine (NORVASC) 5 MG tablet    Sig: Take 1 tablet (5 mg total) by mouth daily.    Dispense:  90 tablet    Refill:  0    Return in about 4 weeks (around 11/30/2020) for F/U, BP check, Rhonda Sanders PCP.   Total time spent:20 Minutes Time spent includes review of chart, medications, test results, and follow up plan with the patient.   [place] Controlled Substance Database was reviewed by me.  This patient was seen by Jonetta Osgood, FNP-C in collaboration with Dr. Clayborn Bigness as a part of collaborative care agreement.   Rhonda Sanders R. Valetta Fuller, MSN, FNP-C Internal medicine

## 2020-11-03 LAB — COMPREHENSIVE METABOLIC PANEL
ALT: 34 IU/L — ABNORMAL HIGH (ref 0–32)
AST: 27 IU/L (ref 0–40)
Albumin/Globulin Ratio: 1.8 (ref 1.2–2.2)
Albumin: 4.6 g/dL (ref 3.8–4.9)
Alkaline Phosphatase: 102 IU/L (ref 44–121)
BUN/Creatinine Ratio: 24 — ABNORMAL HIGH (ref 9–23)
BUN: 23 mg/dL (ref 6–24)
Bilirubin Total: 0.5 mg/dL (ref 0.0–1.2)
CO2: 22 mmol/L (ref 20–29)
Calcium: 10.1 mg/dL (ref 8.7–10.2)
Chloride: 101 mmol/L (ref 96–106)
Creatinine, Ser: 0.94 mg/dL (ref 0.57–1.00)
Globulin, Total: 2.5 g/dL (ref 1.5–4.5)
Glucose: 98 mg/dL (ref 65–99)
Potassium: 4.4 mmol/L (ref 3.5–5.2)
Sodium: 140 mmol/L (ref 134–144)
Total Protein: 7.1 g/dL (ref 6.0–8.5)
eGFR: 70 mL/min/{1.73_m2} (ref >59)

## 2020-11-03 LAB — LIPID PANEL WITH LDL/HDL RATIO
Cholesterol, Total: 181 mg/dL (ref 100–199)
HDL: 40 mg/dL (ref >39)
LDL Chol Calc (NIH): 108 mg/dL — ABNORMAL HIGH (ref 0–99)
LDL/HDL Ratio: 2.7 ratio (ref 0.0–3.2)
Triglycerides: 191 mg/dL — ABNORMAL HIGH (ref 0–149)
VLDL Cholesterol Cal: 33 mg/dL (ref 5–40)

## 2020-11-03 LAB — TSH+FREE T4
Free T4: 1.31 ng/dL (ref 0.82–1.77)
TSH: 1.36 u[IU]/mL (ref 0.450–4.500)

## 2020-11-03 LAB — CBC WITH DIFFERENTIAL/PLATELET
Basophils Absolute: 0.1 10*3/uL (ref 0.0–0.2)
Basos: 1 %
EOS (ABSOLUTE): 0.4 10*3/uL (ref 0.0–0.4)
Eos: 5 %
Hematocrit: 45.1 % (ref 34.0–46.6)
Hemoglobin: 15.1 g/dL (ref 11.1–15.9)
Immature Grans (Abs): 0 10*3/uL (ref 0.0–0.1)
Immature Granulocytes: 0 %
Lymphocytes Absolute: 2.9 10*3/uL (ref 0.7–3.1)
Lymphs: 32 %
MCH: 29.7 pg (ref 26.6–33.0)
MCHC: 33.5 g/dL (ref 31.5–35.7)
MCV: 89 fL (ref 79–97)
Monocytes Absolute: 0.8 10*3/uL (ref 0.1–0.9)
Monocytes: 9 %
Neutrophils Absolute: 4.8 10*3/uL (ref 1.4–7.0)
Neutrophils: 53 %
Platelets: 375 10*3/uL (ref 150–450)
RBC: 5.08 x10E6/uL (ref 3.77–5.28)
RDW: 12.3 % (ref 11.7–15.4)
WBC: 9 10*3/uL (ref 3.4–10.8)

## 2020-11-03 LAB — VITAMIN D 25 HYDROXY (VIT D DEFICIENCY, FRACTURES): Vit D, 25-Hydroxy: 29.9 ng/mL — ABNORMAL LOW (ref 30.0–100.0)

## 2020-11-03 LAB — VITAMIN B12: Vitamin B-12: 867 pg/mL (ref 232–1245)

## 2020-11-03 LAB — FSH/LH
FSH: 51.8 m[IU]/mL
LH: 27.6 m[IU]/mL

## 2020-11-15 ENCOUNTER — Other Ambulatory Visit: Payer: Self-pay

## 2020-11-15 DIAGNOSIS — R7303 Prediabetes: Secondary | ICD-10-CM

## 2020-11-15 DIAGNOSIS — E669 Obesity, unspecified: Secondary | ICD-10-CM

## 2020-11-15 DIAGNOSIS — J309 Allergic rhinitis, unspecified: Secondary | ICD-10-CM

## 2020-11-15 DIAGNOSIS — I10 Essential (primary) hypertension: Secondary | ICD-10-CM

## 2020-11-15 MED ORDER — RYBELSUS 3 MG PO TABS: 3.0000 mg | ORAL_TABLET | Freq: Every day | ORAL | 1 refills | Status: DC

## 2020-11-15 MED ORDER — HYDROCHLOROTHIAZIDE 25 MG PO TABS: 25.0000 mg | ORAL_TABLET | Freq: Every day | ORAL | 1 refills | Status: DC

## 2020-11-15 MED ORDER — ROSUVASTATIN CALCIUM 5 MG PO TABS: 5.0000 mg | ORAL_TABLET | Freq: Every day | ORAL | 1 refills | Status: DC

## 2020-11-15 NOTE — Telephone Encounter (Signed)
Pt called need refills for rybelsus ,crestor and hctz send to [facility]

## 2020-11-29 ENCOUNTER — Ambulatory Visit: Payer: 59 | Admitting: Dermatology

## 2020-12-07 ENCOUNTER — Ambulatory Visit: Payer: BC Managed Care – PPO | Admitting: Nurse Practitioner

## 2020-12-07 ENCOUNTER — Encounter: Payer: Self-pay | Admitting: Nurse Practitioner

## 2020-12-07 ENCOUNTER — Other Ambulatory Visit: Payer: Self-pay

## 2020-12-07 VITALS — BP 130/80 | HR 98 | Temp 97.8°F | Resp 16 | Ht 63.0 in | Wt 187.0 lb

## 2020-12-07 DIAGNOSIS — N951 Menopausal and female climacteric states: Secondary | ICD-10-CM | POA: Diagnosis not present

## 2020-12-07 DIAGNOSIS — Z23 Encounter for immunization: Secondary | ICD-10-CM

## 2020-12-07 DIAGNOSIS — R7303 Prediabetes: Secondary | ICD-10-CM

## 2020-12-07 DIAGNOSIS — E669 Obesity, unspecified: Secondary | ICD-10-CM

## 2020-12-07 DIAGNOSIS — Z6832 Body mass index (BMI) 32.0-32.9, adult: Secondary | ICD-10-CM

## 2020-12-07 DIAGNOSIS — I10 Essential (primary) hypertension: Secondary | ICD-10-CM

## 2020-12-07 MED ORDER — RYBELSUS 3 MG PO TABS: 3.0000 mg | ORAL_TABLET | Freq: Every day | ORAL | 2 refills | Status: DC

## 2020-12-07 MED ORDER — AMLODIPINE BESYLATE 5 MG PO TABS: 5.0000 mg | ORAL_TABLET | Freq: Every day | ORAL | 1 refills | Status: DC

## 2020-12-07 MED ORDER — VENLAFAXINE HCL 75 MG PO TABS: 75.0000 mg | ORAL_TABLET | Freq: Every day | ORAL | 2 refills | Status: DC

## 2020-12-07 NOTE — Progress Notes (Signed)
[facility] [place], [place] [address]  Internal MEDICINE  Office Visit Note  Patient Name: Rhonda Sanders  [id number]  [id number]  Date of Service: 12/07/2020  Chief Complaint  Patient presents with   Medical Management of Chronic Issues    Weight management    Hyperlipidemia   Hypertension    HPI Rhonda Sanders presents for a follow up visit for weight loss management and hypertension. Her blood pressure is much more controlled today. At her previous office visit, she was started on amlodipine 5 mg daily. This has improved her blood pressure and she reports no issue with medication side effects. She also continues to take lisinopril 40 mg daily.  As of today's office visit, Rhonda Sanders has not gained or lost any weight since her previous office visit. There was so initial struggle with getting her insurance to cover Rybelsus. Metformin was prescribed at her previous office visit in an effort to get her insurance to cover the Rybelsus. She is not having no issue with getting the Rybelsus at the pharmacy. She reports that she is tolerating the medication well. She is prediabetic so this medication will help with weight loss as well as control her glucose levels.  She went on vacation recently and did not take the medication every day while she was on vacation. She is continuing to work on lifestyle and diet modifications while taking rybelsus.  She is also tolerating the venlafaxine which has been helping her hot flashes, night sweats and mood swings related to menopause.  She is requesting the flu vaccine.   Current Medication: Outpatient Encounter Medications as of 12/07/2020  Medication Sig   cetirizine (ZYRTEC) 10 MG tablet Take 1 tablet (10 mg total) by mouth daily.   fluticasone (FLONASE) 50 MCG/ACT nasal spray Place 2 sprays into both nostrils daily.   hydrochlorothiazide (HYDRODIURIL) 25 MG tablet Take 1 tablet (25 mg total) by mouth daily.   lisinopril (ZESTRIL) 40 MG  tablet Take 1 tablet (40 mg total) by mouth daily.   montelukast (SINGULAIR) 10 MG tablet Take 1 tablet (10 mg total) by mouth at bedtime.   Multiple Vitamin (MULTIVITAMIN) tablet Take 1 tablet by mouth daily.   rosuvastatin (CRESTOR) 5 MG tablet Take 1 tablet (5 mg total) by mouth daily.   [DISCONTINUED] amLODipine (NORVASC) 5 MG tablet Take 1 tablet (5 mg total) by mouth daily.   [DISCONTINUED] Semaglutide (RYBELSUS) 3 MG TABS Take 3 mg by mouth daily before breakfast. With no more than 4 oz of water, wait at least 30 minutes before eating, drinking or taking other medications.   [DISCONTINUED] venlafaxine (EFFEXOR) 75 MG tablet Take 1 tablet (75 mg total) by mouth 2 (two) times daily.   amLODipine (NORVASC) 5 MG tablet Take 1 tablet (5 mg total) by mouth daily.   Semaglutide (RYBELSUS) 3 MG TABS Take 3 mg by mouth daily before breakfast. With no more than 4 oz of water, wait at least 30 minutes before eating, drinking or taking other medications.   venlafaxine (EFFEXOR) 75 MG tablet Take 1 tablet (75 mg total) by mouth daily.   [DISCONTINUED] metFORMIN (GLUCOPHAGE) 500 MG tablet Take 1 tablet (500 mg total) by mouth 2 (two) times daily with a meal.   No facility-administered encounter medications on file as of 12/07/2020.    Surgical History: Past Surgical History:  Procedure Laterality Date   ABDOMINAL HYSTERECTOMY  04/2006   partial   BREAST BIOPSY Right 2014   CORE W/CLIP - NEG  BREAST BIOPSY Right 2015   CORE W/OUT CLIP - NEG COLUMNAR CELL CHANGE WITH MICRO CALCIFICATIONS.    BREAST EXCISIONAL BIOPSY Right 2010   EXCISIONAL -ALH, columnar cell hyperplasia   COLONOSCOPY WITH PROPOFOL N/A 05/26/2015   Procedure: COLONOSCOPY WITH PROPOFOL;  Surgeon: Robert Bellow, MD;  Location: [facility] ENDOSCOPY;  Service: Endoscopy;  Laterality: N/A;    Medical History: Past Medical History:  Diagnosis Date   Actinic keratosis 07/18/2012   R distal med pretibial   Allergy    seasonal     Atypical lobular hyperplasia of right breast 12/25/2008   Sngle focus noted in area of columnar cell hyperplasia   Basal cell carcinoma 06/19/2006   sternum   BCC (basal cell carcinoma of skin) 05/17/2009   L lat chest sup to breast   BCC (basal cell carcinoma of skin) 05/17/2009   R med mid calf   BCC (basal cell carcinoma of skin) 06/02/2013   R sup distal lat pretibial   BCC (basal cell carcinoma of skin) 06/02/2013   R inf distal lat pretibial   BCC (basal cell carcinoma of skin) 12/08/2015   R inf med knee   BCC (basal cell carcinoma of skin) 12/08/2015   R mid to distal lat pretibial   BCC (basal cell carcinoma of skin) 05/03/2016   L lat pretibial below knee   BCC (basal cell carcinoma of skin) 05/03/2016   L prox calf   BCC (basal cell carcinoma of skin) 05/03/2016   R prox calf prox   BCC (basal cell carcinoma of skin) 05/03/2016   R prox calf distal   Hypercholesterolemia    Hypertension    PONV (postoperative nausea and vomiting)     Family History: Family History  Problem Relation Age of Onset   Breast cancer Mother 63   Heart disease Father        first MI (20s), died age 39 - MI   Heart disease Paternal Grandfather    Heart disease Paternal Uncle    CVA Paternal Grandmother    Breast cancer Maternal Aunt 6   Crohn's disease Brother    Stomach cancer Maternal Grandfather    Colon cancer Neg Hx     Social History   Socioeconomic History   Marital status: Single    Spouse name: Not on file   Number of children: 0   Years of education: Not on file   Highest education level: Not on file  Occupational History   Not on file  Tobacco Use   Smoking status: Never   Smokeless tobacco: Never  Vaping Use   Vaping Use: Never used  Substance and Sexual Activity   Alcohol use: Yes    Alcohol/week: 0.0 standard drinks    Comment: occasionally   Drug use: No   Sexual activity: Not on file  Other Topics Concern   Not on file  Social History Narrative    Not on file   Social Determinants of Health   Financial Resource Strain: Not on file  Food Insecurity: Not on file  Transportation Needs: Not on file  Physical Activity: Not on file  Stress: Not on file  Social Connections: Not on file  Intimate Partner Violence: Not on file      Review of Systems  Constitutional:  Negative for chills, fatigue and unexpected weight change.  HENT:  Negative for congestion, postnasal drip, rhinorrhea, sneezing and sore throat.   Eyes:  Negative for redness.  Respiratory:  Negative for  cough, chest tightness and shortness of breath.   Cardiovascular:  Negative for chest pain and palpitations.  Gastrointestinal:  Negative for abdominal pain, constipation, diarrhea, nausea and vomiting.  Genitourinary:  Negative for dysuria and frequency.  Musculoskeletal:  Negative for arthralgias, back pain, joint swelling and neck pain.  Skin:  Negative for rash.  Neurological: Negative.  Negative for tremors and numbness.  Hematological:  Negative for adenopathy. Does not bruise/bleed easily.  Psychiatric/Behavioral:  Negative for behavioral problems (Depression), sleep disturbance and suicidal ideas. The patient is not nervous/anxious.    Vital Signs: BP 130/80   Pulse 98   Temp 97.8 F (36.6 C)   Resp 16   Ht 5\' 3"  (1.6 m)   Wt 187 lb (84.8 kg)   SpO2 98%   BMI 33.13 kg/m    Physical Exam Vitals reviewed.  Constitutional:      General: She is not in acute distress.    Appearance: Normal appearance. She is obese. She is not ill-appearing.  HENT:     Head: Normocephalic and atraumatic.  Eyes:     Extraocular Movements: Extraocular movements intact.     Pupils: Pupils are equal, round, and reactive to light.  Cardiovascular:     Rate and Rhythm: Normal rate and regular rhythm.  Pulmonary:     Effort: Pulmonary effort is normal. No respiratory distress.  Neurological:     Mental Status: She is alert and oriented to person, place, and time.      Cranial Nerves: No cranial nerve deficit.     Coordination: Coordination normal.     Gait: Gait normal.  Psychiatric:        Mood and Affect: Mood normal.        Behavior: Behavior normal.       Assessment/Plan: 1. Essential hypertension BP better controlled with amlodipine. Continue as prescribed, refill ordered  - amLODipine (NORVASC) 5 MG tablet; Take 1 tablet (5 mg total) by mouth daily.  Dispense: 90 tablet; Refill: 1  2. Vasomotor symptoms due to menopause Working well, continue as prescribed, refill ordered  - venlafaxine (EFFEXOR) 75 MG tablet; Take 1 tablet (75 mg total) by mouth daily.  Dispense: 30 tablet; Refill: 2  3. Prediabetes Continue rybelsus as prescribed.  - Semaglutide (RYBELSUS) 3 MG TABS; Take 3 mg by mouth daily before breakfast. With no more than 4 oz of water, wait at least 30 minutes before eating, drinking or taking other medications.  Dispense: 30 tablet; Refill: 2  4. Class 1 obesity with body mass index (BMI) of 32.0 to 32.9 in adult, unspecified obesity type, unspecified whether serious comorbidity present She has not gained or lost any weight since her previous office visit. Continue Rybelsus as prescribed to aid current diet and lifestyle modifications as previously discussed.  - Semaglutide (RYBELSUS) 3 MG TABS; Take 3 mg by mouth daily before breakfast. With no more than 4 oz of water, wait at least 30 minutes before eating, drinking or taking other medications.  Dispense: 30 tablet; Refill: 2  5. Flu vaccine need Administered in office today - Flu Vaccine MDCK QUAD PF   General Counseling: Rhonda Sanders understanding of the findings of todays visit and agrees with plan of treatment. I have discussed any further diagnostic evaluation that may be needed or ordered today. We also reviewed her medications today. she has been encouraged to call the office with any questions or concerns that should arise related to todays visit.    Orders Placed  This  Encounter  Procedures   Flu Vaccine MDCK QUAD PF    Meds ordered this encounter  Medications   venlafaxine (EFFEXOR) 75 MG tablet    Sig: Take 1 tablet (75 mg total) by mouth daily.    Dispense:  30 tablet    Refill:  2   Semaglutide (RYBELSUS) 3 MG TABS    Sig: Take 3 mg by mouth daily before breakfast. With no more than 4 oz of water, wait at least 30 minutes before eating, drinking or taking other medications.    Dispense:  30 tablet    Refill:  2   amLODipine (NORVASC) 5 MG tablet    Sig: Take 1 tablet (5 mg total) by mouth daily.    Dispense:  90 tablet    Refill:  1    Return in about 3 months (around 03/09/2021) for F/U, Weight loss, Jleigh Striplin PCP.   Total time spent:30 Minutes Time spent includes review of chart, medications, test results, and follow up plan with the patient.   [place] Controlled Substance Database was reviewed by me.  This patient was seen by Jonetta Osgood, FNP-C in collaboration with Dr. Clayborn Bigness as a part of collaborative care agreement.   Sigmond Patalano R. Valetta Fuller, MSN, FNP-C Internal medicine

## 2021-03-08 ENCOUNTER — Ambulatory Visit: Payer: BC Managed Care – PPO | Admitting: Nurse Practitioner

## 2021-03-17 ENCOUNTER — Ambulatory Visit: Payer: BC Managed Care – PPO | Admitting: Nurse Practitioner

## 2021-03-17 ENCOUNTER — Encounter: Payer: Self-pay | Admitting: Nurse Practitioner

## 2021-03-17 ENCOUNTER — Other Ambulatory Visit: Payer: Self-pay

## 2021-03-17 VITALS — BP 130/70 | HR 97 | Temp 98.6°F | Resp 16 | Ht 63.5 in | Wt 187.0 lb

## 2021-03-17 DIAGNOSIS — J309 Allergic rhinitis, unspecified: Secondary | ICD-10-CM

## 2021-03-17 DIAGNOSIS — N951 Menopausal and female climacteric states: Secondary | ICD-10-CM

## 2021-03-17 DIAGNOSIS — Z6832 Body mass index (BMI) 32.0-32.9, adult: Secondary | ICD-10-CM

## 2021-03-17 DIAGNOSIS — J0110 Acute frontal sinusitis, unspecified: Secondary | ICD-10-CM

## 2021-03-17 DIAGNOSIS — E669 Obesity, unspecified: Secondary | ICD-10-CM

## 2021-03-17 DIAGNOSIS — I10 Essential (primary) hypertension: Secondary | ICD-10-CM

## 2021-03-17 DIAGNOSIS — R7303 Prediabetes: Secondary | ICD-10-CM

## 2021-03-17 MED ORDER — HYDROCHLOROTHIAZIDE 25 MG PO TABS: 25.0000 mg | ORAL_TABLET | Freq: Every day | ORAL | 1 refills | Status: DC

## 2021-03-17 MED ORDER — VENLAFAXINE HCL 75 MG PO TABS: 75.0000 mg | ORAL_TABLET | Freq: Every day | ORAL | 2 refills | Status: DC

## 2021-03-17 MED ORDER — RYBELSUS 3 MG PO TABS: 3.0000 mg | ORAL_TABLET | Freq: Every day | ORAL | 2 refills | Status: DC

## 2021-03-17 MED ORDER — AZITHROMYCIN 250 MG PO TABS
ORAL_TABLET | ORAL | 0 refills | Status: AC
Start: 2021-03-17 — End: 2021-03-22

## 2021-03-17 MED ORDER — AMLODIPINE BESYLATE 5 MG PO TABS: 5.0000 mg | ORAL_TABLET | Freq: Every day | ORAL | 1 refills | Status: DC

## 2021-03-17 MED ORDER — ROSUVASTATIN CALCIUM 5 MG PO TABS: 5.0000 mg | ORAL_TABLET | Freq: Every day | ORAL | 1 refills | Status: DC

## 2021-03-17 MED ORDER — LISINOPRIL 40 MG PO TABS: 40.0000 mg | ORAL_TABLET | Freq: Every day | ORAL | 1 refills | Status: DC

## 2021-03-17 MED ORDER — MONTELUKAST SODIUM 10 MG PO TABS: 10.0000 mg | ORAL_TABLET | Freq: Every day | ORAL | 3 refills | Status: DC

## 2021-03-17 NOTE — Progress Notes (Signed)
[facility] [place],  [address]  Internal MEDICINE  Office Visit Note  Patient Name: Rhonda Sanders  [id number]  [id number]  Date of Service: 03/17/2021  Chief Complaint  Patient presents with   Follow-up    Right ear pain   Hyperlipidemia   Hypertension   Weight Loss   Sinusitis   Medication Refill    HPI Caris presents for a follow up visit for weight loss management, prediabetes and possible sinus infection. She is taking Rybelsus, we just came through the holidays. She has not gained or lost any weight since her previous office visit.  She reports have sinus headache and pressure, nasal congestion and drainage. She has some ear fullness on the right side and her right neck feels sore below her ear.   Current Medication: Outpatient Encounter Medications as of 03/17/2021  Medication Sig   azithromycin (ZITHROMAX) 250 MG tablet Take 2 tablets on day 1, then 1 tablet daily on days 2 through 5   cetirizine (ZYRTEC) 10 MG tablet Take 1 tablet (10 mg total) by mouth daily.   fluticasone (FLONASE) 50 MCG/ACT nasal spray Place 2 sprays into both nostrils daily.   Multiple Vitamin (MULTIVITAMIN) tablet Take 1 tablet by mouth daily.   [DISCONTINUED] amLODipine (NORVASC) 5 MG tablet Take 1 tablet (5 mg total) by mouth daily.   [DISCONTINUED] hydrochlorothiazide (HYDRODIURIL) 25 MG tablet Take 1 tablet (25 mg total) by mouth daily.   [DISCONTINUED] lisinopril (ZESTRIL) 40 MG tablet Take 1 tablet (40 mg total) by mouth daily.   [DISCONTINUED] montelukast (SINGULAIR) 10 MG tablet Take 1 tablet (10 mg total) by mouth at bedtime.   [DISCONTINUED] rosuvastatin (CRESTOR) 5 MG tablet Take 1 tablet (5 mg total) by mouth daily.   [DISCONTINUED] Semaglutide (RYBELSUS) 3 MG TABS Take 3 mg by mouth daily before breakfast. With no more than 4 oz of water, wait at least 30 minutes before eating, drinking or taking other medications.   [DISCONTINUED] venlafaxine (EFFEXOR)  75 MG tablet Take 1 tablet (75 mg total) by mouth daily.   amLODipine (NORVASC) 5 MG tablet Take 1 tablet (5 mg total) by mouth daily.   hydrochlorothiazide (HYDRODIURIL) 25 MG tablet Take 1 tablet (25 mg total) by mouth daily.   lisinopril (ZESTRIL) 40 MG tablet Take 1 tablet (40 mg total) by mouth daily.   montelukast (SINGULAIR) 10 MG tablet Take 1 tablet (10 mg total) by mouth at bedtime.   rosuvastatin (CRESTOR) 5 MG tablet Take 1 tablet (5 mg total) by mouth daily.   Semaglutide (RYBELSUS) 3 MG TABS Take 3 mg by mouth daily before breakfast. With no more than 4 oz of water, wait at least 30 minutes before eating, drinking or taking other medications.   venlafaxine (EFFEXOR) 75 MG tablet Take 1 tablet (75 mg total) by mouth daily.   No facility-administered encounter medications on file as of 03/17/2021.    Surgical History: Past Surgical History:  Procedure Laterality Date   ABDOMINAL HYSTERECTOMY  04/2006   partial   BREAST BIOPSY Right 2014   CORE W/CLIP - NEG   BREAST BIOPSY Right 2015   CORE W/OUT CLIP - NEG COLUMNAR CELL CHANGE WITH MICRO CALCIFICATIONS.    BREAST EXCISIONAL BIOPSY Right 2010   EXCISIONAL -ALH, columnar cell hyperplasia   COLONOSCOPY WITH PROPOFOL N/A 05/26/2015   Procedure: COLONOSCOPY WITH PROPOFOL;  Surgeon: Robert Bellow, MD;  Location: [facility] ENDOSCOPY;  Service: Endoscopy;  Laterality: N/A;    Medical History:  Past Medical History:  Diagnosis Date   Actinic keratosis 07/18/2012   R distal med pretibial   Allergy    seasonal    Atypical lobular hyperplasia of right breast 12/25/2008   Sngle focus noted in area of columnar cell hyperplasia   Basal cell carcinoma 06/19/2006   sternum   BCC (basal cell carcinoma of skin) 05/17/2009   L lat chest sup to breast   BCC (basal cell carcinoma of skin) 05/17/2009   R med mid calf   BCC (basal cell carcinoma of skin) 06/02/2013   R sup distal lat pretibial   BCC (basal cell carcinoma of skin)  06/02/2013   R inf distal lat pretibial   BCC (basal cell carcinoma of skin) 12/08/2015   R inf med knee   BCC (basal cell carcinoma of skin) 12/08/2015   R mid to distal lat pretibial   BCC (basal cell carcinoma of skin) 05/03/2016   L lat pretibial below knee   BCC (basal cell carcinoma of skin) 05/03/2016   L prox calf   BCC (basal cell carcinoma of skin) 05/03/2016   R prox calf prox   BCC (basal cell carcinoma of skin) 05/03/2016   R prox calf distal   Hypercholesterolemia    Hypertension    PONV (postoperative nausea and vomiting)     Family History: Family History  Problem Relation Age of Onset   Breast cancer Mother 41   Heart disease Father        first MI (79s), died age 40 - MI   Heart disease Paternal Grandfather    Heart disease Paternal Uncle    CVA Paternal Grandmother    Breast cancer Maternal Aunt 57   Crohn's disease Brother    Stomach cancer Maternal Grandfather    Colon cancer Neg Hx     Social History   Socioeconomic History   Marital status: Single    Spouse name: Not on file   Number of children: 0   Years of education: Not on file   Highest education level: Not on file  Occupational History   Not on file  Tobacco Use   Smoking status: Never   Smokeless tobacco: Never  Vaping Use   Vaping Use: Never used  Substance and Sexual Activity   Alcohol use: Yes    Alcohol/week: 0.0 standard drinks    Comment: occasionally   Drug use: No   Sexual activity: Not on file  Other Topics Concern   Not on file  Social History Narrative   Not on file   Social Determinants of Health   Financial Resource Strain: Not on file  Food Insecurity: Not on file  Transportation Needs: Not on file  Physical Activity: Not on file  Stress: Not on file  Social Connections: Not on file  Intimate Partner Violence: Not on file      Review of Systems  Constitutional:  Negative for chills, fatigue, fever and unexpected weight change.  HENT:  Positive for  congestion, ear pain, postnasal drip, sinus pressure and sinus pain. Negative for rhinorrhea, sneezing and sore throat.   Eyes:  Negative for redness.  Respiratory:  Negative for cough, chest tightness, shortness of breath and wheezing.   Cardiovascular:  Negative for chest pain and palpitations.  Gastrointestinal:  Negative for abdominal pain, constipation, diarrhea, nausea and vomiting.  Genitourinary:  Negative for dysuria and frequency.  Musculoskeletal:  Negative for arthralgias, back pain, joint swelling and neck pain.  Skin:  Negative for  rash.  Neurological:  Positive for headaches. Negative for tremors and numbness.  Hematological:  Negative for adenopathy. Does not bruise/bleed easily.  Psychiatric/Behavioral:  Negative for behavioral problems (Depression), sleep disturbance and suicidal ideas. The patient is not nervous/anxious.    Vital Signs: BP 130/70 Comment: 150/90   Pulse 97    Temp 98.6 F (37 C)    Resp 16    Ht 5' 3.5" (1.613 m)    Wt 187 lb (84.8 kg)    SpO2 98%    BMI 32.61 kg/m    Physical Exam Vitals reviewed.  Constitutional:      General: She is not in acute distress.    Appearance: Normal appearance. She is obese. She is not ill-appearing.  HENT:     Head: Normocephalic and atraumatic.     Right Ear: Tympanic membrane, ear canal and external ear normal.     Left Ear: Tympanic membrane, ear canal and external ear normal.     Nose: Congestion present. No rhinorrhea.     Mouth/Throat:     Mouth: Mucous membranes are moist.     Pharynx: Posterior oropharyngeal erythema present. No oropharyngeal exudate.  Eyes:     Pupils: Pupils are equal, round, and reactive to light.  Cardiovascular:     Rate and Rhythm: Normal rate and regular rhythm.  Pulmonary:     Effort: Pulmonary effort is normal. No respiratory distress.  Lymphadenopathy:     Cervical: Cervical adenopathy present.  Neurological:     Mental Status: She is alert.  Psychiatric:        Mood and  Affect: Mood normal.        Behavior: Behavior normal.       Assessment/Plan: 1. Acute non-recurrent frontal sinusitis Empiric antibiotic treatment prescribed, call the clinic in no improvement or worsening of symptoms.  - azithromycin (ZITHROMAX) 250 MG tablet; Take 2 tablets on day 1, then 1 tablet daily on days 2 through 5  Dispense: 6 tablet; Refill: 0  2. Essential hypertension Stable with current medications, continue as prescribed.  - hydrochlorothiazide (HYDRODIURIL) 25 MG tablet; Take 1 tablet (25 mg total) by mouth daily.  Dispense: 90 tablet; Refill: 1 - lisinopril (ZESTRIL) 40 MG tablet; Take 1 tablet (40 mg total) by mouth daily.  Dispense: 90 tablet; Refill: 1 - amLODipine (NORVASC) 5 MG tablet; Take 1 tablet (5 mg total) by mouth daily.  Dispense: 90 tablet; Refill: 1  3. Chronic allergic rhinitis Stable, continue montelukast as prescribed - montelukast (SINGULAIR) 10 MG tablet; Take 1 tablet (10 mg total) by mouth at bedtime.  Dispense: 90 tablet; Refill: 3  4. Vasomotor symptoms due to menopause Stable, continue as prescribed.  - venlafaxine (EFFEXOR) 75 MG tablet; Take 1 tablet (75 mg total) by mouth daily.  Dispense: 30 tablet; Refill: 2  5. Prediabetes Continue as prescribed, follow up in 2 months, will consider increasing dose then - Semaglutide (RYBELSUS) 3 MG TABS; Take 3 mg by mouth daily before breakfast. With no more than 4 oz of water, wait at least 30 minutes before eating, drinking or taking other medications.  Dispense: 30 tablet; Refill: 2  6. Class 1 obesity with body mass index (BMI) of 32.0 to 32.9 in adult, unspecified obesity type, unspecified whether serious comorbidity present No weight loss or gain this visit, follow up in 2 months, will consider increasing rybelsus dose then - Semaglutide (RYBELSUS) 3 MG TABS; Take 3 mg by mouth daily before breakfast. With no more than 4  oz of water, wait at least 30 minutes before eating, drinking or taking  other medications.  Dispense: 30 tablet; Refill: 2   General Counseling: horace wishon understanding of the findings of todays visit and agrees with plan of treatment. I have discussed any further diagnostic evaluation that may be needed or ordered today. We also reviewed her medications today. she has been encouraged to call the office with any questions or concerns that should arise related to todays visit.    No orders of the defined types were placed in this encounter.   Meds ordered this encounter  Medications   azithromycin (ZITHROMAX) 250 MG tablet    Sig: Take 2 tablets on day 1, then 1 tablet daily on days 2 through 5    Dispense:  6 tablet    Refill:  0   hydrochlorothiazide (HYDRODIURIL) 25 MG tablet    Sig: Take 1 tablet (25 mg total) by mouth daily.    Dispense:  90 tablet    Refill:  1   lisinopril (ZESTRIL) 40 MG tablet    Sig: Take 1 tablet (40 mg total) by mouth daily.    Dispense:  90 tablet    Refill:  1   amLODipine (NORVASC) 5 MG tablet    Sig: Take 1 tablet (5 mg total) by mouth daily.    Dispense:  90 tablet    Refill:  1   rosuvastatin (CRESTOR) 5 MG tablet    Sig: Take 1 tablet (5 mg total) by mouth daily.    Dispense:  90 tablet    Refill:  1    Please fill as 90 day prescriptions if possible.   venlafaxine (EFFEXOR) 75 MG tablet    Sig: Take 1 tablet (75 mg total) by mouth daily.    Dispense:  30 tablet    Refill:  2   montelukast (SINGULAIR) 10 MG tablet    Sig: Take 1 tablet (10 mg total) by mouth at bedtime.    Dispense:  90 tablet    Refill:  3   Semaglutide (RYBELSUS) 3 MG TABS    Sig: Take 3 mg by mouth daily before breakfast. With no more than 4 oz of water, wait at least 30 minutes before eating, drinking or taking other medications.    Dispense:  30 tablet    Refill:  2    Return in about 2 months (around 05/15/2021) for F/U, Weight loss, Cambree Hendrix PCP.   Total time spent:30 Minutes Time spent includes review of chart, medications,  test results, and follow up plan with the patient.   [place] Controlled Substance Database was reviewed by me.  This patient was seen by Jonetta Osgood, FNP-C in collaboration with Dr. Clayborn Bigness as a part of collaborative care agreement.   Biddie Sebek R. Valetta Fuller, MSN, FNP-C Internal medicine

## 2021-03-20 ENCOUNTER — Other Ambulatory Visit: Payer: Self-pay

## 2021-03-20 NOTE — Telephone Encounter (Signed)
PA for RYBELSUS 3 mg sent 03/20/21 @ 912pm

## 2021-03-22 ENCOUNTER — Telehealth: Payer: Self-pay

## 2021-03-22 DIAGNOSIS — Z6832 Body mass index (BMI) 32.0-32.9, adult: Secondary | ICD-10-CM

## 2021-03-22 DIAGNOSIS — E669 Obesity, unspecified: Secondary | ICD-10-CM

## 2021-03-22 DIAGNOSIS — R7303 Prediabetes: Secondary | ICD-10-CM

## 2021-03-22 MED ORDER — RYBELSUS 3 MG PO TABS: 3.0000 mg | ORAL_TABLET | Freq: Every day | ORAL | 2 refills | Status: DC

## 2021-03-22 NOTE — Telephone Encounter (Signed)
PA for RYBELSUS 3 mg was approved 03/20/21 to 03/19/2022 new prescription sent to pharmacy

## 2021-04-04 ENCOUNTER — Telehealth: Payer: Self-pay

## 2021-04-04 ENCOUNTER — Other Ambulatory Visit: Payer: Self-pay

## 2021-04-04 DIAGNOSIS — E669 Obesity, unspecified: Secondary | ICD-10-CM

## 2021-04-04 DIAGNOSIS — R7303 Prediabetes: Secondary | ICD-10-CM

## 2021-04-04 DIAGNOSIS — Z6832 Body mass index (BMI) 32.0-32.9, adult: Secondary | ICD-10-CM

## 2021-04-04 MED ORDER — RYBELSUS 3 MG PO TABS: 3.0000 mg | ORAL_TABLET | Freq: Every day | ORAL | 2 refills | Status: DC

## 2021-04-04 NOTE — Telephone Encounter (Signed)
Pt called and was asking about her PA for RYBELSUS 3 mg, I informed pt that it was approved on 03/20/21 and I resent a new rx to her pharmacy

## 2021-05-11 ENCOUNTER — Other Ambulatory Visit: Payer: Self-pay | Admitting: Nurse Practitioner

## 2021-05-11 ENCOUNTER — Other Ambulatory Visit: Payer: Self-pay | Admitting: Internal Medicine

## 2021-05-11 DIAGNOSIS — Z1231 Encounter for screening mammogram for malignant neoplasm of breast: Secondary | ICD-10-CM

## 2021-05-19 ENCOUNTER — Other Ambulatory Visit: Payer: Self-pay

## 2021-05-19 ENCOUNTER — Ambulatory Visit: Payer: BC Managed Care – PPO | Admitting: Nurse Practitioner

## 2021-05-19 MED ORDER — CETIRIZINE HCL 10 MG PO TABS: 10.0000 mg | ORAL_TABLET | Freq: Every day | ORAL | 3 refills | Status: DC

## 2021-05-20 ENCOUNTER — Other Ambulatory Visit: Payer: Self-pay

## 2021-05-20 DIAGNOSIS — J309 Allergic rhinitis, unspecified: Secondary | ICD-10-CM

## 2021-05-20 MED ORDER — CETIRIZINE HCL 10 MG PO TABS: 10.0000 mg | ORAL_TABLET | Freq: Every day | ORAL | 3 refills | Status: DC

## 2021-06-20 ENCOUNTER — Ambulatory Visit
Admission: RE | Admit: 2021-06-20 | Discharge: 2021-06-20 | Disposition: A | Discharge status: Home / Self Care | Payer: BC Managed Care – PPO | Source: Ambulatory Visit | Attending: Nurse Practitioner | Admitting: Nurse Practitioner

## 2021-06-20 DIAGNOSIS — Z1231 Encounter for screening mammogram for malignant neoplasm of breast: Secondary | ICD-10-CM | POA: Insufficient documentation

## 2021-06-21 ENCOUNTER — Ambulatory Visit: Payer: BC Managed Care – PPO | Admitting: Nurse Practitioner

## 2021-06-21 ENCOUNTER — Encounter: Payer: Self-pay | Admitting: Nurse Practitioner

## 2021-06-21 VITALS — BP 132/80 | HR 94 | Temp 98.7°F | Resp 16 | Ht 63.5 in | Wt 188.0 lb

## 2021-06-21 DIAGNOSIS — R7303 Prediabetes: Secondary | ICD-10-CM

## 2021-06-21 DIAGNOSIS — E669 Obesity, unspecified: Secondary | ICD-10-CM | POA: Diagnosis not present

## 2021-06-21 DIAGNOSIS — J0110 Acute frontal sinusitis, unspecified: Secondary | ICD-10-CM

## 2021-06-21 DIAGNOSIS — I10 Essential (primary) hypertension: Secondary | ICD-10-CM | POA: Diagnosis not present

## 2021-06-21 DIAGNOSIS — Z6832 Body mass index (BMI) 32.0-32.9, adult: Secondary | ICD-10-CM

## 2021-06-21 MED ORDER — PHENDIMETRAZINE TARTRATE 35 MG PO TABS: 35.0000 mg | ORAL_TABLET | Freq: Three times a day (TID) | ORAL | 0 refills | Status: DC

## 2021-06-21 NOTE — Progress Notes (Signed)
[facility] ?[address] ?[place], [place] [address] ? ?Internal MEDICINE  ?Office Visit Note ? ?Patient Name: Rhonda Sanders ? [id number]  ?[id number] ? ?Date of Service: 06/21/2021 ? ?Chief Complaint  ?Patient presents with  ? Follow-up  ? Hyperlipidemia  ? Hypertension  ? ? ?HPI ?Rhonda Sanders presents for a follow up visit for weight loss management, hypertension and hyperlipidemia. Rhonda Sanders has been taking phentermine to aid in weight loss but has been on it so much that Rhonda Sanders would like to try something different because Rhonda Sanders feels like the phentermine is not helping.  ?Rhonda Sanders blood pressure is stable and well controlled. The sinus infection Rhonda Sanders had in January is completely resolved.  ? ? ?Current Medication: ?Outpatient Encounter Medications as of 06/21/2021  ?Medication Sig  ? amLODipine (NORVASC) 5 MG tablet Take 1 tablet (5 mg total) by mouth daily.  ? cetirizine (ZYRTEC) 10 MG tablet Take 1 tablet (10 mg total) by mouth daily.  ? fluticasone (FLONASE) 50 MCG/ACT nasal spray Place 2 sprays into both nostrils daily.  ? hydrochlorothiazide (HYDRODIURIL) 25 MG tablet Take 1 tablet (25 mg total) by mouth daily.  ? lisinopril (ZESTRIL) 40 MG tablet Take 1 tablet (40 mg total) by mouth daily.  ? montelukast (SINGULAIR) 10 MG tablet Take 1 tablet (10 mg total) by mouth at bedtime.  ? Multiple Vitamin (MULTIVITAMIN) tablet Take 1 tablet by mouth daily.  ? Phendimetrazine Tartrate 35 MG TABS Take 1 tablet (35 mg total) by mouth 3 (three) times daily with meals.  ? rosuvastatin (CRESTOR) 5 MG tablet Take 1 tablet (5 mg total) by mouth daily.  ? [DISCONTINUED] Semaglutide (RYBELSUS) 3 MG TABS Take 3 mg by mouth daily before breakfast. With no more than 4 oz of water, wait at least 30 minutes before eating, drinking or taking other medications.  ? [DISCONTINUED] venlafaxine (EFFEXOR) 75 MG tablet Take 1 tablet (75 mg total) by mouth daily.  ? ?No facility-administered encounter medications on file as of 06/21/2021.  ? ? ?Surgical  History: ?Past Surgical History:  ?Procedure Laterality Date  ? ABDOMINAL HYSTERECTOMY  04/2006  ? partial  ? BREAST BIOPSY Right 2014  ? CORE W/CLIP - NEG  ? BREAST BIOPSY Right 2015  ? CORE W/OUT CLIP - NEG COLUMNAR CELL CHANGE WITH MICRO CALCIFICATIONS.   ? BREAST EXCISIONAL BIOPSY Right 2010  ? EXCISIONAL -ALH, columnar cell hyperplasia  ? COLONOSCOPY WITH PROPOFOL N/A 05/26/2015  ? Procedure: COLONOSCOPY WITH PROPOFOL;  Surgeon: Robert Bellow, MD;  Location: [facility] ENDOSCOPY;  Service: Endoscopy;  Laterality: N/A;  ? ? ?Medical History: ?Past Medical History:  ?Diagnosis Date  ? Actinic keratosis 07/18/2012  ? R distal med pretibial  ? Allergy   ? seasonal   ? Atypical lobular hyperplasia of right breast 12/25/2008  ? Sngle focus noted in area of columnar cell hyperplasia  ? Basal cell carcinoma 06/19/2006  ? sternum  ? BCC (basal cell carcinoma of skin) 05/17/2009  ? L lat chest sup to breast  ? BCC (basal cell carcinoma of skin) 05/17/2009  ? R med mid calf  ? BCC (basal cell carcinoma of skin) 06/02/2013  ? R sup distal lat pretibial  ? BCC (basal cell carcinoma of skin) 06/02/2013  ? R inf distal lat pretibial  ? BCC (basal cell carcinoma of skin) 12/08/2015  ? R inf med knee  ? BCC (basal cell carcinoma of skin) 12/08/2015  ? R mid to distal lat pretibial  ? BCC (basal cell carcinoma  of skin) 05/03/2016  ? L lat pretibial below knee  ? BCC (basal cell carcinoma of skin) 05/03/2016  ? L prox calf  ? BCC (basal cell carcinoma of skin) 05/03/2016  ? R prox calf prox  ? BCC (basal cell carcinoma of skin) 05/03/2016  ? R prox calf distal  ? Hypercholesterolemia   ? Hypertension   ? PONV (postoperative nausea and vomiting)   ? ? ?Family History: ?Family History  ?Problem Relation Age of Onset  ? Breast cancer Mother 62  ? Heart disease Father   ?     first MI (61s), died age 37 - MI  ? Heart disease Paternal Grandfather   ? Heart disease Paternal Uncle   ? CVA Paternal Grandmother   ? Breast cancer Maternal Aunt  25  ? Crohn's disease Brother   ? Stomach cancer Maternal Grandfather   ? Colon cancer Neg Hx   ? ? ?Social History  ? ?Socioeconomic History  ? Marital status: Single  ?  Spouse name: Not on file  ? Number of children: 0  ? Years of education: Not on file  ? Highest education level: Not on file  ?Occupational History  ? Not on file  ?Tobacco Use  ? Smoking status: Never  ? Smokeless tobacco: Never  ?Vaping Use  ? Vaping Use: Never used  ?Substance and Sexual Activity  ? Alcohol use: Yes  ?  Alcohol/week: 0.0 standard drinks  ?  Comment: occasionally  ? Drug use: No  ? Sexual activity: Not on file  ?Other Topics Concern  ? Not on file  ?Social History Narrative  ? Not on file  ? ?Social Determinants of Health  ? ?Financial Resource Strain: Not on file  ?Food Insecurity: Not on file  ?Transportation Needs: Not on file  ?Physical Activity: Not on file  ?Stress: Not on file  ?Social Connections: Not on file  ?Intimate Partner Violence: Not on file  ? ? ? ? ?Review of Systems  ?Constitutional:  Negative for chills, fatigue and unexpected weight change.  ?HENT:  Negative for congestion, rhinorrhea, sneezing and sore throat.   ?Eyes:  Negative for redness.  ?Respiratory: Negative.  Negative for cough, chest tightness, shortness of breath and wheezing.   ?Cardiovascular: Negative.  Negative for chest pain and palpitations.  ?Gastrointestinal:  Negative for abdominal pain, constipation, diarrhea, nausea and vomiting.  ?Genitourinary:  Negative for dysuria and frequency.  ?Musculoskeletal:  Negative for arthralgias, back pain, joint swelling and neck pain.  ?Skin:  Negative for rash.  ?Neurological: Negative.  Negative for tremors and numbness.  ?Hematological:  Negative for adenopathy. Does not bruise/bleed easily.  ?Psychiatric/Behavioral:  Negative for behavioral problems (Depression), sleep disturbance and suicidal ideas. The patient is not nervous/anxious.   ? ?Vital Signs: ?BP 132/80   Pulse 94   Temp 98.7 ?F (37.1  ?C)   Resp 16   Ht 5' 3.5" (1.613 m)   Wt 188 lb (85.3 kg)   SpO2 97%   BMI 32.78 kg/m?  ? ? ?Physical Exam ?Vitals reviewed.  ?Constitutional:   ?   General: Rhonda Sanders is not in acute distress. ?   Appearance: Normal appearance. Rhonda Sanders is obese. Rhonda Sanders is not ill-appearing.  ?HENT:  ?   Head: Normocephalic and atraumatic.  ?Eyes:  ?   Pupils: Pupils are equal, round, and reactive to light.  ?Cardiovascular:  ?   Rate and Rhythm: Normal rate and regular rhythm.  ?Pulmonary:  ?   Effort: Pulmonary effort is  normal. No respiratory distress.  ?Neurological:  ?   Mental Status: Rhonda Sanders is alert and oriented to person, place, and time.  ?Psychiatric:     ?   Mood and Affect: Mood normal.     ?   Behavior: Behavior normal.  ? ? ? ? ? ?Assessment/Plan: ?1. Essential hypertension ?Blood pressure is stable and controlled, continue current medication as prescribed.  ? ?2. Prediabetes ?Stable, consider repeat A1C at next office visit if due.  ? ?3. Acute non-recurrent frontal sinusitis ?resolved ? ?4. Class 1 obesity with body mass index (BMI) of 32.0 to 32.9 in adult, unspecified obesity type, unspecified whether serious comorbidity present ?Phendimetrazine prescribed, follow up in 1 month ? ? ?General Counseling: Rhonda Sanders understanding of the findings of todays visit and agrees with plan of treatment. I have discussed any further diagnostic evaluation that may be needed or ordered today. We also reviewed Rhonda Sanders medications today. Rhonda Sanders has been encouraged to call the office with any questions or concerns that should arise related to todays visit. ? ? ? ?No orders of the defined types were placed in this encounter. ? ? ?Meds ordered this encounter  ?Medications  ? Phendimetrazine Tartrate 35 MG TABS  ?  Sig: Take 1 tablet (35 mg total) by mouth 3 (three) times daily with meals.  ?  Dispense:  90 tablet  ?  Refill:  0  ?  Do not bill through insurance, patient had a goodrx card Rhonda Sanders will provide.  ? ? ?Return in about 4 weeks (around  07/19/2021) for F/U, Weight loss, Rhonda Sanders PCP. ? ? ?Total time spent:30 Minutes ?Time spent includes review of chart, medications, test results, and follow up plan with the patient.  ? ?[place] Controlled Substance

## 2021-06-26 ENCOUNTER — Other Ambulatory Visit: Payer: Self-pay | Admitting: Nurse Practitioner

## 2021-06-26 DIAGNOSIS — N951 Menopausal and female climacteric states: Secondary | ICD-10-CM

## 2021-07-07 ENCOUNTER — Encounter: Payer: BC Managed Care – PPO | Admitting: Nurse Practitioner

## 2021-07-19 ENCOUNTER — Ambulatory Visit: Payer: BC Managed Care – PPO | Admitting: Nurse Practitioner

## 2021-07-26 ENCOUNTER — Encounter: Payer: Self-pay | Admitting: Nurse Practitioner

## 2021-07-26 ENCOUNTER — Ambulatory Visit (INDEPENDENT_AMBULATORY_CARE_PROVIDER_SITE_OTHER): Payer: BC Managed Care – PPO | Admitting: Nurse Practitioner

## 2021-07-26 VITALS — BP 128/82 | HR 100 | Temp 98.5°F | Resp 16 | Ht 63.5 in | Wt 184.4 lb

## 2021-07-26 DIAGNOSIS — R748 Abnormal levels of other serum enzymes: Secondary | ICD-10-CM

## 2021-07-26 DIAGNOSIS — R300 Dysuria: Secondary | ICD-10-CM

## 2021-07-26 DIAGNOSIS — Z0001 Encounter for general adult medical examination with abnormal findings: Secondary | ICD-10-CM | POA: Diagnosis not present

## 2021-07-26 DIAGNOSIS — E66811 Obesity, class 1: Secondary | ICD-10-CM

## 2021-07-26 DIAGNOSIS — Z6832 Body mass index (BMI) 32.0-32.9, adult: Secondary | ICD-10-CM

## 2021-07-26 DIAGNOSIS — E782 Mixed hyperlipidemia: Secondary | ICD-10-CM

## 2021-07-26 DIAGNOSIS — E669 Obesity, unspecified: Secondary | ICD-10-CM

## 2021-07-26 DIAGNOSIS — N951 Menopausal and female climacteric states: Secondary | ICD-10-CM

## 2021-07-26 DIAGNOSIS — I10 Essential (primary) hypertension: Secondary | ICD-10-CM | POA: Diagnosis not present

## 2021-07-26 DIAGNOSIS — E559 Vitamin D deficiency, unspecified: Secondary | ICD-10-CM

## 2021-07-26 DIAGNOSIS — J309 Allergic rhinitis, unspecified: Secondary | ICD-10-CM

## 2021-07-26 DIAGNOSIS — E7800 Pure hypercholesterolemia, unspecified: Secondary | ICD-10-CM

## 2021-07-26 DIAGNOSIS — R7303 Prediabetes: Secondary | ICD-10-CM | POA: Diagnosis not present

## 2021-07-26 MED ORDER — AMLODIPINE BESYLATE 5 MG PO TABS: 5.0000 mg | ORAL_TABLET | Freq: Every day | ORAL | 1 refills | Status: DC

## 2021-07-26 MED ORDER — LISINOPRIL 40 MG PO TABS: 40.0000 mg | ORAL_TABLET | Freq: Every day | ORAL | 1 refills | Status: DC

## 2021-07-26 MED ORDER — HYDROCHLOROTHIAZIDE 25 MG PO TABS: 25.0000 mg | ORAL_TABLET | Freq: Every day | ORAL | 1 refills | Status: DC

## 2021-07-26 MED ORDER — VENLAFAXINE HCL 75 MG PO TABS: 75.0000 mg | ORAL_TABLET | Freq: Every day | ORAL | 1 refills | Status: DC

## 2021-07-26 MED ORDER — PHENDIMETRAZINE TARTRATE ER 105 MG PO CP24: 105.0000 mg | ORAL_CAPSULE | Freq: Every day | ORAL | 0 refills | Status: DC

## 2021-07-26 MED ORDER — ROSUVASTATIN CALCIUM 5 MG PO TABS: 5.0000 mg | ORAL_TABLET | Freq: Every day | ORAL | 1 refills | Status: DC

## 2021-07-26 NOTE — Progress Notes (Signed)
[facility] [place], [place] [address]  Internal MEDICINE  Office Visit Note  Patient Name: Rhonda Sanders  [id number]  [id number]  Date of Service: 07/26/2021  Chief Complaint  Patient presents with   Annual Exam    Discuss meds   Hyperlipidemia   Hypertension   Weight Loss    HPI Rhonda Sanders presents for an annual well visit and physical exam.  She is a well-appearing 61 year old female with hypertension, allergic rhinitis, high cholesterol, vitamin D deficiency, prediabetes and menopausal vasomotor symptoms.  She had her routine screening mammogram in April of this year and the result was BI-RADS Category 1 negative.  Annual screening mammogram is still recommended.  She is due for a Pap smear in May next year.  And she had a routine colonoscopy in 2017 and is due to have a repeat routine colonoscopy in 2027.  She is due for routine labs and medication refills today. She is still concerned about weight loss management and has been taking phendimetrazine 35 mg 3 times daily before meals but would like to switch to the extended release version of phendimetrazine.  She has lost 4 pounds since her previous office visit so this is a reasonable request.  Her blood pressure is stable and controlled with current medication.  Her other vital signs are also stable and within normal limits. Overall she is in good spirits and positive mood with the weight loss medication helping along with diet and lifestyle modifications that she has made in her life. Is prediabetic with her last A1c being 6.0 in June 2022.  We will add a repeat A1c to her routine labs      Current Medication: Outpatient Encounter Medications as of 07/26/2021  Medication Sig   cetirizine (ZYRTEC) 10 MG tablet Take 1 tablet (10 mg total) by mouth daily.   fluticasone (FLONASE) 50 MCG/ACT nasal spray Place 2 sprays into both nostrils daily.   montelukast (SINGULAIR) 10 MG tablet Take 1 tablet (10 mg total) by  mouth at bedtime.   Multiple Vitamin (MULTIVITAMIN) tablet Take 1 tablet by mouth daily.   Phendimetrazine Tartrate 105 MG CP24 Take 1 capsule (105 mg total) by mouth daily before breakfast.   [DISCONTINUED] amLODipine (NORVASC) 5 MG tablet Take 1 tablet (5 mg total) by mouth daily.   [DISCONTINUED] hydrochlorothiazide (HYDRODIURIL) 25 MG tablet Take 1 tablet (25 mg total) by mouth daily.   [DISCONTINUED] lisinopril (ZESTRIL) 40 MG tablet Take 1 tablet (40 mg total) by mouth daily.   [DISCONTINUED] Phendimetrazine Tartrate 35 MG TABS Take 1 tablet (35 mg total) by mouth 3 (three) times daily with meals.   [DISCONTINUED] rosuvastatin (CRESTOR) 5 MG tablet Take 1 tablet (5 mg total) by mouth daily.   [DISCONTINUED] venlafaxine (EFFEXOR) 75 MG tablet Take 1 tablet by mouth once daily   amLODipine (NORVASC) 5 MG tablet Take 1 tablet (5 mg total) by mouth daily.   hydrochlorothiazide (HYDRODIURIL) 25 MG tablet Take 1 tablet (25 mg total) by mouth daily.   lisinopril (ZESTRIL) 40 MG tablet Take 1 tablet (40 mg total) by mouth daily.   rosuvastatin (CRESTOR) 5 MG tablet Take 1 tablet (5 mg total) by mouth daily.   venlafaxine (EFFEXOR) 75 MG tablet Take 1 tablet (75 mg total) by mouth daily.   No facility-administered encounter medications on file as of 07/26/2021.    Surgical History: Past Surgical History:  Procedure Laterality Date   ABDOMINAL HYSTERECTOMY  04/2006   partial   BREAST  BIOPSY Right 2014   CORE W/CLIP - NEG   BREAST BIOPSY Right 2015   CORE W/OUT CLIP - NEG COLUMNAR CELL CHANGE WITH MICRO CALCIFICATIONS.    BREAST EXCISIONAL BIOPSY Right 2010   EXCISIONAL -ALH, columnar cell hyperplasia   COLONOSCOPY WITH PROPOFOL N/A 05/26/2015   Procedure: COLONOSCOPY WITH PROPOFOL;  Surgeon: Robert Bellow, MD;  Location: [facility] ENDOSCOPY;  Service: Endoscopy;  Laterality: N/A;    Medical History: Past Medical History:  Diagnosis Date   Actinic keratosis 07/18/2012   R distal med  pretibial   Allergy    seasonal    Atypical lobular hyperplasia of right breast 12/25/2008   Sngle focus noted in area of columnar cell hyperplasia   Basal cell carcinoma 06/19/2006   sternum   BCC (basal cell carcinoma of skin) 05/17/2009   L lat chest sup to breast   BCC (basal cell carcinoma of skin) 05/17/2009   R med mid calf   BCC (basal cell carcinoma of skin) 06/02/2013   R sup distal lat pretibial   BCC (basal cell carcinoma of skin) 06/02/2013   R inf distal lat pretibial   BCC (basal cell carcinoma of skin) 12/08/2015   R inf med knee   BCC (basal cell carcinoma of skin) 12/08/2015   R mid to distal lat pretibial   BCC (basal cell carcinoma of skin) 05/03/2016   L lat pretibial below knee   BCC (basal cell carcinoma of skin) 05/03/2016   L prox calf   BCC (basal cell carcinoma of skin) 05/03/2016   R prox calf prox   BCC (basal cell carcinoma of skin) 05/03/2016   R prox calf distal   Hypercholesterolemia    Hypertension    PONV (postoperative nausea and vomiting)     Family History: Family History  Problem Relation Age of Onset   Breast cancer Mother 58   Heart disease Father        first MI (65s), died age 63 - MI   Heart disease Paternal Grandfather    Heart disease Paternal Uncle    CVA Paternal Grandmother    Breast cancer Maternal Aunt 3   Crohn's disease Brother    Stomach cancer Maternal Grandfather    Colon cancer Neg Hx     Social History   Socioeconomic History   Marital status: Single    Spouse name: Not on file   Number of children: 0   Years of education: Not on file   Highest education level: Not on file  Occupational History   Not on file  Tobacco Use   Smoking status: Never   Smokeless tobacco: Never  Vaping Use   Vaping Use: Never used  Substance and Sexual Activity   Alcohol use: Yes    Alcohol/week: 0.0 standard drinks    Comment: occasionally   Drug use: No   Sexual activity: Not on file  Other Topics Concern   Not  on file  Social History Narrative   Not on file   Social Determinants of Health   Financial Resource Strain: Not on file  Food Insecurity: Not on file  Transportation Needs: Not on file  Physical Activity: Not on file  Stress: Not on file  Social Connections: Not on file  Intimate Partner Violence: Not on file      Review of Systems  Constitutional:  Negative for activity change, appetite change, chills, fatigue, fever and unexpected weight change.  HENT: Negative.  Negative for congestion, ear pain,  rhinorrhea, sore throat and trouble swallowing.   Eyes: Negative.   Respiratory: Negative.  Negative for cough, chest tightness, shortness of breath and wheezing.   Cardiovascular: Negative.  Negative for chest pain.  Gastrointestinal: Negative.  Negative for abdominal pain, blood in stool, constipation, diarrhea, nausea and vomiting.  Endocrine: Negative.   Genitourinary: Negative.  Negative for difficulty urinating, dysuria, frequency, hematuria and urgency.  Musculoskeletal: Negative.  Negative for arthralgias, back pain, joint swelling, myalgias and neck pain.  Skin: Negative.  Negative for rash and wound.  Allergic/Immunologic: Negative.  Negative for immunocompromised state.  Neurological: Negative.  Negative for dizziness, seizures, numbness and headaches.  Hematological: Negative.   Psychiatric/Behavioral: Negative.  Negative for behavioral problems, self-injury and suicidal ideas. The patient is not nervous/anxious.    Vital Signs: BP 128/82   Pulse 100   Temp 98.5 F (36.9 C)   Resp 16   Ht 5' 3.5" (1.613 m)   Wt 184 lb 6.4 oz (83.6 kg)   SpO2 99%   BMI 32.15 kg/m    Physical Exam Vitals reviewed.  Constitutional:      General: She is awake. She is not in acute distress.    Appearance: Normal appearance. She is well-developed and well-groomed. She is obese. She is not ill-appearing or diaphoretic.  HENT:     Head: Normocephalic and atraumatic.     Right Ear:  Tympanic membrane, ear canal and external ear normal.     Left Ear: Tympanic membrane, ear canal and external ear normal.     Nose: Nose normal.     Mouth/Throat:     Lips: Pink.     Mouth: Mucous membranes are moist.     Pharynx: Oropharynx is clear. Uvula midline. No oropharyngeal exudate or posterior oropharyngeal erythema.  Eyes:     General: Lids are normal. Vision grossly intact. Gaze aligned appropriately. No scleral icterus.       Right eye: No discharge.        Left eye: No discharge.     Conjunctiva/sclera: Conjunctivae normal.     Pupils: Pupils are equal, round, and reactive to light.     Funduscopic exam:    Right eye: Red reflex present.        Left eye: Red reflex present. Neck:     Thyroid: No thyromegaly.     Vascular: No JVD.     Trachea: Trachea and phonation normal. No tracheal deviation.  Cardiovascular:     Rate and Rhythm: Normal rate and regular rhythm.     Pulses: Normal pulses.     Heart sounds: Normal heart sounds, S1 normal and S2 normal. No murmur heard.    No friction rub. No gallop.  Pulmonary:     Effort: Pulmonary effort is normal. No accessory muscle usage or respiratory distress.     Breath sounds: Normal breath sounds and air entry. No stridor. No wheezing or rales.  Chest:     Chest wall: No tenderness.     Comments: Declined clinical breast exam Abdominal:     General: Bowel sounds are normal. There is no distension.     Palpations: Abdomen is soft. There is no shifting dullness, fluid wave, mass or pulsatile mass.     Tenderness: There is no abdominal tenderness. There is no guarding or rebound.  Musculoskeletal:        General: No tenderness or deformity. Normal range of motion.     Cervical back: Normal range of motion and neck supple.  Right lower leg: No edema.     Left lower leg: No edema.  Lymphadenopathy:     Cervical: No cervical adenopathy.  Skin:    General: Skin is warm and dry.     Coloration: Skin is not pale.      Findings: No erythema or rash.  Neurological:     Mental Status: She is alert.     Cranial Nerves: No cranial nerve deficit.     Motor: No abnormal muscle tone.     Coordination: Coordination normal.     Deep Tendon Reflexes: Reflexes are normal and symmetric.  Psychiatric:        Behavior: Behavior normal. Behavior is cooperative.        Thought Content: Thought content normal.        Judgment: Judgment normal.        Assessment/Plan: 1. Encounter for routine adult health examination with abnormal findings Age-appropriate preventive screenings and vaccinations discussed, annual physical exam completed. Routine labs for health maintenance ordered, see below. PHM updated.  - CBC with Differential/Platelet - CMP14+EGFR - Lipid Profile - Vitamin D (25 hydroxy) - Hgb A1C w/o eAG  2. Essential hypertension Blood pressure is stable and well-controlled on current medications, routine labs ordered, and medication refills ordered, continue medications as prescribed, no changes. - CBC with Differential/Platelet - CMP14+EGFR - Lipid Profile - Vitamin D (25 hydroxy) - lisinopril (ZESTRIL) 40 MG tablet; Take 1 tablet (40 mg total) by mouth daily.  Dispense: 90 tablet; Refill: 1 - amLODipine (NORVASC) 5 MG tablet; Take 1 tablet (5 mg total) by mouth daily.  Dispense: 90 tablet; Refill: 1 - hydrochlorothiazide (HYDRODIURIL) 25 MG tablet; Take 1 tablet (25 mg total) by mouth daily.  Dispense: 90 tablet; Refill: 1  3. Vasomotor symptoms due to menopause Current dose of venlafaxine continues to be effective in controlling her vasomotor symptoms, refills ordered. - venlafaxine (EFFEXOR) 75 MG tablet; Take 1 tablet (75 mg total) by mouth daily.  Dispense: 90 tablet; Refill: 1  4. Prediabetes Repeat A1c added to labs that will be drawn in August, patient is not on any medication at this time and her last A1c was 6.0 - CMP14+EGFR - Lipid Profile - Hgb A1C w/o eAG  5. Abnormal liver  enzymes Routine labs ordered, will review liver function labs on the metabolic panel, labs will be drawn in August - CBC with Differential/Platelet - CMP14+EGFR - Lipid Profile  6. Mixed hyperlipidemia Routine lab ordered, to be drawn in August.  Rosuvastatin refills ordered, continue as prescribed - Lipid Profile - rosuvastatin (CRESTOR) 5 MG tablet; Take 1 tablet (5 mg total) by mouth daily.  Dispense: 90 tablet; Refill: 1  7. Vitamin D deficiency Routine lab ordered, to be done in August - Vitamin D (25 hydroxy)  8. Dysuria Routine urinalysis done - UA/M w/rflx Culture, Routine - Microscopic Examination  9. Class 1 obesity with body mass index (BMI) of 32.0 to 32.9 in adult, unspecified obesity type, unspecified whether serious comorbidity present Patient has lost 4 pounds since previous office visit, phendimetrazine changed to the extended release capsule 105 mg once daily before breakfast, follow-up in 1 month - Phendimetrazine Tartrate 105 MG CP24; Take 1 capsule (105 mg total) by mouth daily before breakfast.  Dispense: 30 capsule; Refill: 0      General Counseling: siearra amberg understanding of the findings of todays visit and agrees with plan of treatment. I have discussed any further diagnostic evaluation that may be needed or ordered today.  We also reviewed her medications today. she has been encouraged to call the office with any questions or concerns that should arise related to todays visit.    Orders Placed This Encounter  Procedures   UA/M w/rflx Culture, Routine   CBC with Differential/Platelet   CMP14+EGFR   Lipid Profile   Vitamin D (25 hydroxy)   POCT glycosylated hemoglobin (Hb A1C)    Meds ordered this encounter  Medications   Phendimetrazine Tartrate 105 MG CP24    Sig: Take 1 capsule (105 mg total) by mouth daily before breakfast.    Dispense:  30 capsule    Refill:  0    Do not run through insurance, patient has a good rx coupon she will  bring to pharmacy   lisinopril (ZESTRIL) 40 MG tablet    Sig: Take 1 tablet (40 mg total) by mouth daily.    Dispense:  90 tablet    Refill:  1   amLODipine (NORVASC) 5 MG tablet    Sig: Take 1 tablet (5 mg total) by mouth daily.    Dispense:  90 tablet    Refill:  1   rosuvastatin (CRESTOR) 5 MG tablet    Sig: Take 1 tablet (5 mg total) by mouth daily.    Dispense:  90 tablet    Refill:  1    Please fill as 90 day prescriptions if possible.   venlafaxine (EFFEXOR) 75 MG tablet    Sig: Take 1 tablet (75 mg total) by mouth daily.    Dispense:  90 tablet    Refill:  1   hydrochlorothiazide (HYDRODIURIL) 25 MG tablet    Sig: Take 1 tablet (25 mg total) by mouth daily.    Dispense:  90 tablet    Refill:  1    Return in about 4 weeks (around 08/23/2021) for F/U, Weight loss, Kewanna Kasprzak PCP.   Total time spent: 30 minutes Time spent includes review of chart, medications, test results, and follow up plan with the patient.   [place] Controlled Substance Database was reviewed by me.  This patient was seen by Jonetta Osgood, FNP-C in collaboration with Dr. Clayborn Bigness as a part of collaborative care agreement.  Franchon Ketterman R. Valetta Fuller, MSN, FNP-C Internal medicine

## 2021-07-27 LAB — UA/M W/RFLX CULTURE, ROUTINE
Bilirubin, UA: NEGATIVE
Glucose, UA: NEGATIVE
Ketones, UA: NEGATIVE
Leukocytes,UA: NEGATIVE
Nitrite, UA: NEGATIVE
Protein,UA: NEGATIVE
RBC, UA: NEGATIVE
Specific Gravity, UA: 1.026 (ref 1.005–1.030)
Urobilinogen, Ur: 0.2 mg/dL (ref 0.2–1.0)
pH, UA: 5.5 (ref 5.0–7.5)

## 2021-07-27 LAB — MICROSCOPIC EXAMINATION
Bacteria, UA: NONE SEEN
Casts: NONE SEEN /lpf
WBC, UA: NONE SEEN /hpf (ref 0–5)

## 2021-08-23 ENCOUNTER — Ambulatory Visit: Payer: BC Managed Care – PPO | Admitting: Nurse Practitioner

## 2021-09-15 ENCOUNTER — Encounter: Payer: Self-pay | Admitting: Nurse Practitioner

## 2021-10-04 ENCOUNTER — Encounter: Payer: Self-pay | Admitting: Nurse Practitioner

## 2021-10-04 ENCOUNTER — Ambulatory Visit: Payer: BC Managed Care – PPO | Admitting: Nurse Practitioner

## 2021-10-04 VITALS — BP 139/83 | HR 99 | Temp 98.6°F | Resp 16 | Ht 63.5 in | Wt 184.0 lb

## 2021-10-04 DIAGNOSIS — Z6832 Body mass index (BMI) 32.0-32.9, adult: Secondary | ICD-10-CM

## 2021-10-04 DIAGNOSIS — E669 Obesity, unspecified: Secondary | ICD-10-CM | POA: Diagnosis not present

## 2021-10-04 DIAGNOSIS — N951 Menopausal and female climacteric states: Secondary | ICD-10-CM | POA: Diagnosis not present

## 2021-10-04 DIAGNOSIS — R7303 Prediabetes: Secondary | ICD-10-CM

## 2021-10-04 DIAGNOSIS — I10 Essential (primary) hypertension: Secondary | ICD-10-CM | POA: Diagnosis not present

## 2021-10-04 MED ORDER — PHENDIMETRAZINE TARTRATE ER 105 MG PO CP24: 105.0000 mg | ORAL_CAPSULE | Freq: Every day | ORAL | 1 refills | Status: DC

## 2021-10-04 NOTE — Progress Notes (Signed)
[facility] [place], [place] [address]  Internal MEDICINE  Office Visit Note  Sanders Name: Rhonda Sanders  [id number]  [id number]  Date of Service: 10/04/2021  Chief Complaint  Sanders presents with   Follow-up   Hyperlipidemia   Hypertension   Weight Loss    HPI  Rhonda Sanders presents for follow-up visit for hypertension, hyperlipidemia and weight loss.  At Rhonda Sanders previous office visit Rhonda Sanders was switched to Rhonda extended release form of phendimetrazine which Rhonda Sanders did notice has started to help suppress Rhonda Sanders appetite but Rhonda Sanders had some unexpected things, up at home and had to push Rhonda Sanders follow-up visit out an extra month.  Rhonda Sanders is interested in restarting Rhonda phendimetrazine ER for a weight loss aid and Rhonda Sanders will continue Rhonda Sanders diet and lifestyle modifications as previously discussed.    Current Medication: Outpatient Encounter Medications as of 10/04/2021  Medication Sig   amLODipine (NORVASC) 5 MG tablet Take 1 tablet (5 mg total) by mouth daily.   cetirizine (ZYRTEC) 10 MG tablet Take 1 tablet (10 mg total) by mouth daily.   fluticasone (FLONASE) 50 MCG/ACT nasal spray Place 2 sprays into both nostrils daily.   hydrochlorothiazide (HYDRODIURIL) 25 MG tablet Take 1 tablet (25 mg total) by mouth daily.   lisinopril (ZESTRIL) 40 MG tablet Take 1 tablet (40 mg total) by mouth daily.   montelukast (SINGULAIR) 10 MG tablet Take 1 tablet (10 mg total) by mouth at bedtime.   Multiple Vitamin (MULTIVITAMIN) tablet Take 1 tablet by mouth daily.   rosuvastatin (CRESTOR) 5 MG tablet Take 1 tablet (5 mg total) by mouth daily.   venlafaxine (EFFEXOR) 75 MG tablet Take 1 tablet (75 mg total) by mouth daily.   [DISCONTINUED] Phendimetrazine Tartrate 105 MG CP24 Take 1 capsule (105 mg total) by mouth daily before breakfast.   [DISCONTINUED] Phendimetrazine Tartrate 105 MG CP24 Take 1 capsule (105 mg total) by mouth daily before breakfast.   No facility-administered encounter medications on  file as of 10/04/2021.    Surgical History: Past Surgical History:  Procedure Laterality Date   ABDOMINAL HYSTERECTOMY  04/2006   partial   BREAST BIOPSY Right 2014   CORE W/CLIP - NEG   BREAST BIOPSY Right 2015   CORE W/OUT CLIP - NEG COLUMNAR CELL CHANGE WITH MICRO CALCIFICATIONS.    BREAST EXCISIONAL BIOPSY Right 2010   EXCISIONAL -ALH, columnar cell hyperplasia   COLONOSCOPY WITH PROPOFOL N/A 05/26/2015   Procedure: COLONOSCOPY WITH PROPOFOL;  Surgeon: Robert Bellow, MD;  Location: [facility] ENDOSCOPY;  Service: Endoscopy;  Laterality: N/A;    Medical History: Past Medical History:  Diagnosis Date   Actinic keratosis 07/18/2012   R distal med pretibial   Allergy    seasonal    Atypical lobular hyperplasia of right breast 12/25/2008   Sngle focus noted in area of columnar cell hyperplasia   Basal cell carcinoma 06/19/2006   sternum   BCC (basal cell carcinoma of skin) 05/17/2009   L lat chest sup to breast   BCC (basal cell carcinoma of skin) 05/17/2009   R med mid calf   BCC (basal cell carcinoma of skin) 06/02/2013   R sup distal lat pretibial   BCC (basal cell carcinoma of skin) 06/02/2013   R inf distal lat pretibial   BCC (basal cell carcinoma of skin) 12/08/2015   R inf med knee   BCC (basal cell carcinoma of skin) 12/08/2015   R mid to distal lat pretibial   BCC (basal  cell carcinoma of skin) 05/03/2016   L lat pretibial below knee   BCC (basal cell carcinoma of skin) 05/03/2016   L prox calf   BCC (basal cell carcinoma of skin) 05/03/2016   R prox calf prox   BCC (basal cell carcinoma of skin) 05/03/2016   R prox calf distal   Hypercholesterolemia    Hypertension    PONV (postoperative nausea and vomiting)     Family History: Family History  Problem Relation Age of Onset   Breast cancer Mother 33   Heart disease Father        first MI (63s), died age 34 - MI   Heart disease Paternal Grandfather    Heart disease Paternal Uncle    CVA Paternal  Grandmother    Breast cancer Maternal Aunt 40   Crohn's disease Brother    Stomach cancer Maternal Grandfather    Colon cancer Neg Hx     Social History   Socioeconomic History   Marital status: Single    Spouse name: Not on file   Number of children: 0   Years of education: Not on file   Highest education level: Not on file  Occupational History   Not on file  Tobacco Use   Smoking status: Never   Smokeless tobacco: Never  Vaping Use   Vaping Use: Never used  Substance and Sexual Activity   Alcohol use: Yes    Alcohol/week: 0.0 standard drinks of alcohol    Comment: occasionally   Drug use: No   Sexual activity: Not on file  Other Topics Concern   Not on file  Social History Narrative   Not on file   Social Determinants of Health   Financial Resource Strain: Not on file  Food Insecurity: Not on file  Transportation Needs: Not on file  Physical Activity: Not on file  Stress: Not on file  Social Connections: Not on file  Intimate Partner Violence: Not on file      Review of Systems  Constitutional:  Negative for chills, fatigue and unexpected weight change.  HENT:  Negative for congestion, rhinorrhea, sneezing and sore throat.   Eyes:  Negative for redness.  Respiratory: Negative.  Negative for cough, chest tightness, shortness of breath and wheezing.   Cardiovascular: Negative.  Negative for chest pain and palpitations.  Gastrointestinal:  Negative for abdominal pain, constipation, diarrhea, nausea and vomiting.  Genitourinary:  Negative for dysuria and frequency.  Musculoskeletal:  Negative for arthralgias, back pain, joint swelling and neck pain.  Skin:  Negative for rash.  Neurological: Negative.  Negative for tremors and numbness.  Hematological:  Negative for adenopathy. Does not bruise/bleed easily.  Psychiatric/Behavioral:  Negative for behavioral problems (Depression), sleep disturbance and suicidal ideas. Rhonda Sanders is not nervous/anxious.      Vital Signs: BP 139/83   Pulse 99   Temp 98.6 F (37 C)   Resp 16   Ht 5' 3.5" (1.613 m)   Wt 184 lb (83.5 kg)   SpO2 98%   BMI 32.08 kg/m    Physical Exam Vitals reviewed.  Constitutional:      General: Rhonda Sanders is not in acute distress.    Appearance: Normal appearance. Rhonda Sanders is obese. Rhonda Sanders is not ill-appearing.  HENT:     Head: Normocephalic and atraumatic.  Eyes:     Pupils: Pupils are equal, round, and reactive to light.  Cardiovascular:     Rate and Rhythm: Normal rate and regular rhythm.  Pulmonary:  Effort: Pulmonary effort is normal. No respiratory distress.  Neurological:     Mental Status: Rhonda Sanders is alert and oriented to person, place, and time.  Psychiatric:        Mood and Affect: Mood normal.        Behavior: Behavior normal.        Assessment/Plan: 1. Prediabetes Previous A1c was prediabetic, a repeat A1c has been ordered but Rhonda Sanders has not had it drawn yet, Sanders reminded to have Rhonda Sanders labs drawn  2. Essential hypertension Blood pressure is stable on current medications, no changes  3. Vasomotor symptoms due to menopause Venlafaxine remains effective at 75 mg daily in controlling Rhonda Sanders vasomotor symptoms.  No changes  4. Class 1 obesity with body mass index (BMI) of 32.0 to 32.9 in adult, unspecified obesity type, unspecified whether serious comorbidity present No weight loss or gain noted since Rhonda Sanders previous office visit in May. Phendimetrazine ER prescribed x1 month, follow-up in 4 weeks to evaluate weight loss   General Counseling: Rhonda Sanders understanding of Rhonda findings of todays visit and agrees with plan of treatment. I have discussed any further diagnostic evaluation that may be needed or ordered today. We also reviewed Rhonda Sanders medications today. Rhonda Sanders has been encouraged to call Rhonda office with any questions or concerns that should arise related to todays visit.    No orders of Rhonda defined types were placed in this encounter.   Meds ordered  this encounter  Medications   DISCONTD: Phendimetrazine Tartrate 105 MG CP24    Sig: Take 1 capsule (105 mg total) by mouth daily before breakfast.    Dispense:  30 capsule    Refill:  1    Do not run through insurance, Sanders has a good rx coupon Rhonda Sanders will bring to pharmacy    Return in about 8 weeks (around 11/29/2021) for F/U, Weight loss, Toneshia Coello PCP.   Total time spent:30 Minutes Time spent includes review of chart, medications, test results, and follow up plan with Rhonda Sanders.   [place] Controlled Substance Database was reviewed by me.  This Sanders was seen by Jonetta Osgood, FNP-C in collaboration with Dr. Clayborn Bigness as a part of collaborative care agreement.   Marysue Fait R. Valetta Fuller, MSN, FNP-C Internal medicine

## 2021-10-13 ENCOUNTER — Other Ambulatory Visit: Payer: Self-pay

## 2021-10-13 DIAGNOSIS — E669 Obesity, unspecified: Secondary | ICD-10-CM

## 2021-10-13 MED ORDER — PHENDIMETRAZINE TARTRATE ER 105 MG PO CP24: 105.0000 mg | ORAL_CAPSULE | Freq: Every day | ORAL | 1 refills | Status: DC

## 2021-11-29 ENCOUNTER — Ambulatory Visit: Payer: BC Managed Care – PPO | Admitting: Nurse Practitioner

## 2021-12-12 ENCOUNTER — Encounter: Payer: Self-pay | Admitting: Nurse Practitioner

## 2021-12-12 ENCOUNTER — Ambulatory Visit: Payer: BC Managed Care – PPO | Admitting: Nurse Practitioner

## 2021-12-12 VITALS — BP 138/80 | HR 99 | Temp 98.2°F | Resp 16 | Ht 63.5 in | Wt 185.8 lb

## 2021-12-12 DIAGNOSIS — Z23 Encounter for immunization: Secondary | ICD-10-CM | POA: Diagnosis not present

## 2021-12-12 DIAGNOSIS — E669 Obesity, unspecified: Secondary | ICD-10-CM | POA: Diagnosis not present

## 2021-12-12 DIAGNOSIS — R7303 Prediabetes: Secondary | ICD-10-CM

## 2021-12-12 DIAGNOSIS — Z6832 Body mass index (BMI) 32.0-32.9, adult: Secondary | ICD-10-CM | POA: Diagnosis not present

## 2021-12-12 DIAGNOSIS — I10 Essential (primary) hypertension: Secondary | ICD-10-CM

## 2021-12-12 MED ORDER — PHENDIMETRAZINE TARTRATE ER 105 MG PO CP24: 105.0000 mg | ORAL_CAPSULE | Freq: Every day | ORAL | 1 refills | Status: DC

## 2021-12-12 NOTE — Progress Notes (Signed)
[facility] [place], [place] [address]  Internal MEDICINE  Office Visit Note  Patient Name: Rhonda Sanders  [id number]  [id number]  Date of Service: 12/12/2021  Chief Complaint  Patient presents with   Follow-up    Weight loss     HPI Rhonda Sanders presents for a follow up visit for hypertension and weight loss management.  --recently on vacation, did not gain any weight while on vacation. Wants to continue phendimetrazine for another month.  BP was elevated initially, but improved when rechecked.  --takes amlodipine for high BP --Takes rosuvastatin for high cholesterol.  --venlafaxine is still helping her vasomotor symptoms.    Current Medication: Outpatient Encounter Medications as of 12/12/2021  Medication Sig   amLODipine (NORVASC) 5 MG tablet Take 1 tablet (5 mg total) by mouth daily.   cetirizine (ZYRTEC) 10 MG tablet Take 1 tablet (10 mg total) by mouth daily.   fluticasone (FLONASE) 50 MCG/ACT nasal spray Place 2 sprays into both nostrils daily.   hydrochlorothiazide (HYDRODIURIL) 25 MG tablet Take 1 tablet (25 mg total) by mouth daily.   lisinopril (ZESTRIL) 40 MG tablet Take 1 tablet (40 mg total) by mouth daily.   montelukast (SINGULAIR) 10 MG tablet Take 1 tablet (10 mg total) by mouth at bedtime.   Multiple Vitamin (MULTIVITAMIN) tablet Take 1 tablet by mouth daily.   rosuvastatin (CRESTOR) 5 MG tablet Take 1 tablet (5 mg total) by mouth daily.   venlafaxine (EFFEXOR) 75 MG tablet Take 1 tablet (75 mg total) by mouth daily.   [DISCONTINUED] Phendimetrazine Tartrate 105 MG CP24 Take 1 capsule (105 mg total) by mouth daily before breakfast.   Phendimetrazine Tartrate 105 MG CP24 Take 1 capsule (105 mg total) by mouth daily before breakfast.   No facility-administered encounter medications on file as of 12/12/2021.    Surgical History: Past Surgical History:  Procedure Laterality Date   ABDOMINAL HYSTERECTOMY  04/2006   partial   BREAST BIOPSY  Right 2014   CORE W/CLIP - NEG   BREAST BIOPSY Right 2015   CORE W/OUT CLIP - NEG COLUMNAR CELL CHANGE WITH MICRO CALCIFICATIONS.    BREAST EXCISIONAL BIOPSY Right 2010   EXCISIONAL -ALH, columnar cell hyperplasia   COLONOSCOPY WITH PROPOFOL N/A 05/26/2015   Procedure: COLONOSCOPY WITH PROPOFOL;  Surgeon: Robert Bellow, MD;  Location: [facility] ENDOSCOPY;  Service: Endoscopy;  Laterality: N/A;    Medical History: Past Medical History:  Diagnosis Date   Actinic keratosis 07/18/2012   R distal med pretibial   Allergy    seasonal    Atypical lobular hyperplasia of right breast 12/25/2008   Sngle focus noted in area of columnar cell hyperplasia   Basal cell carcinoma 06/19/2006   sternum   BCC (basal cell carcinoma of skin) 05/17/2009   L lat chest sup to breast   BCC (basal cell carcinoma of skin) 05/17/2009   R med mid calf   BCC (basal cell carcinoma of skin) 06/02/2013   R sup distal lat pretibial   BCC (basal cell carcinoma of skin) 06/02/2013   R inf distal lat pretibial   BCC (basal cell carcinoma of skin) 12/08/2015   R inf med knee   BCC (basal cell carcinoma of skin) 12/08/2015   R mid to distal lat pretibial   BCC (basal cell carcinoma of skin) 05/03/2016   L lat pretibial below knee   BCC (basal cell carcinoma of skin) 05/03/2016   L prox calf   BCC (basal  cell carcinoma of skin) 05/03/2016   R prox calf prox   BCC (basal cell carcinoma of skin) 05/03/2016   R prox calf distal   Hypercholesterolemia    Hypertension    PONV (postoperative nausea and vomiting)     Family History: Family History  Problem Relation Age of Onset   Breast cancer Mother 21   Heart disease Father        first MI (58s), died age 72 - MI   Heart disease Paternal Grandfather    Heart disease Paternal Uncle    CVA Paternal Grandmother    Breast cancer Maternal Aunt 40   Crohn's disease Brother    Stomach cancer Maternal Grandfather    Colon cancer Neg Hx     Social History    Socioeconomic History   Marital status: Single    Spouse name: Not on file   Number of children: 0   Years of education: Not on file   Highest education level: Not on file  Occupational History   Not on file  Tobacco Use   Smoking status: Never   Smokeless tobacco: Never  Vaping Use   Vaping Use: Never used  Substance and Sexual Activity   Alcohol use: Yes    Alcohol/week: 0.0 standard drinks of alcohol    Comment: occasionally   Drug use: No   Sexual activity: Not on file  Other Topics Concern   Not on file  Social History Narrative   Not on file   Social Determinants of Health   Financial Resource Strain: Not on file  Food Insecurity: Not on file  Transportation Needs: Not on file  Physical Activity: Not on file  Stress: Not on file  Social Connections: Not on file  Intimate Partner Violence: Not on file      Review of Systems  Constitutional:  Negative for chills, fatigue and unexpected weight change.  HENT:  Negative for congestion, rhinorrhea, sneezing and sore throat.   Eyes:  Negative for redness.  Respiratory: Negative.  Negative for cough, chest tightness, shortness of breath and wheezing.   Cardiovascular: Negative.  Negative for chest pain and palpitations.  Gastrointestinal:  Negative for abdominal pain, constipation, diarrhea, nausea and vomiting.  Genitourinary:  Negative for dysuria and frequency.  Musculoskeletal:  Negative for arthralgias, back pain, joint swelling and neck pain.  Skin:  Negative for rash.  Neurological: Negative.  Negative for tremors and numbness.  Hematological:  Negative for adenopathy. Does not bruise/bleed easily.  Psychiatric/Behavioral:  Negative for behavioral problems (Depression), sleep disturbance and suicidal ideas. The patient is not nervous/anxious.     Vital Signs: BP 138/80 Comment: 144/76  Pulse 99   Temp 98.2 F (36.8 C)   Resp 16   Ht 5' 3.5" (1.613 m)   Wt 185 lb 12.8 oz (84.3 kg)   SpO2 98%   BMI  32.40 kg/m    Physical Exam Vitals reviewed.  Constitutional:      General: She is not in acute distress.    Appearance: Normal appearance. She is obese. She is not ill-appearing.  HENT:     Head: Normocephalic and atraumatic.  Eyes:     Pupils: Pupils are equal, round, and reactive to light.  Cardiovascular:     Rate and Rhythm: Normal rate and regular rhythm.  Pulmonary:     Effort: Pulmonary effort is normal. No respiratory distress.  Neurological:     Mental Status: She is alert and oriented to person, place, and time.  Psychiatric:        Mood and Affect: Mood normal.        Behavior: Behavior normal.        Assessment/Plan: 1. Prediabetes Will continue to improve with weight loss.   2. Essential hypertension Stable, continue amlodipine as prescribed.   3. Class 1 obesity with body mass index (BMI) of 32.0 to 32.9 in adult, unspecified obesity type, unspecified whether serious comorbidity present Continue phendimetrazine for another month as prescribed. Follow up in 4 weeks,.  - Phendimetrazine Tartrate 105 MG CP24; Take 1 capsule (105 mg total) by mouth daily before breakfast.  Dispense: 30 capsule; Refill: 1  4. Needs flu shot Administered in office today.  - Flu Vaccine MDCK QUAD PF   General Counseling: tunisia landgrebe understanding of the findings of todays visit and agrees with plan of treatment. I have discussed any further diagnostic evaluation that may be needed or ordered today. We also reviewed her medications today. she has been encouraged to call the office with any questions or concerns that should arise related to todays visit.    Orders Placed This Encounter  Procedures   Flu Vaccine MDCK QUAD PF    Meds ordered this encounter  Medications   Phendimetrazine Tartrate 105 MG CP24    Sig: Take 1 capsule (105 mg total) by mouth daily before breakfast.    Dispense:  30 capsule    Refill:  1    Do not run through insurance, patient has a good  rx coupon she will bring to pharmacy    Return in about 2 months (around 02/11/2022) for F/U, Weight loss, Nohlan Burdin PCP.   Total time spent:30 Minutes Time spent includes review of chart, medications, test results, and follow up plan with the patient.   [place] Controlled Substance Database was reviewed by me.  This patient was seen by Jonetta Osgood, FNP-C in collaboration with Dr. Clayborn Bigness as a part of collaborative care agreement.   Laryn Venning R. Valetta Fuller, MSN, FNP-C Internal medicine

## 2022-01-21 ENCOUNTER — Encounter: Payer: Self-pay | Admitting: Nurse Practitioner

## 2022-02-14 ENCOUNTER — Ambulatory Visit: Payer: BC Managed Care – PPO | Admitting: Nurse Practitioner

## 2022-02-14 ENCOUNTER — Encounter: Payer: Self-pay | Admitting: Nurse Practitioner

## 2022-02-14 VITALS — BP 140/74 | HR 98 | Temp 98.1°F | Resp 16 | Ht 63.5 in | Wt 188.8 lb

## 2022-02-14 DIAGNOSIS — R7303 Prediabetes: Secondary | ICD-10-CM

## 2022-02-14 DIAGNOSIS — Z6832 Body mass index (BMI) 32.0-32.9, adult: Secondary | ICD-10-CM | POA: Diagnosis not present

## 2022-02-14 DIAGNOSIS — I10 Essential (primary) hypertension: Secondary | ICD-10-CM | POA: Diagnosis not present

## 2022-02-14 DIAGNOSIS — E669 Obesity, unspecified: Secondary | ICD-10-CM | POA: Diagnosis not present

## 2022-02-14 MED ORDER — PHENDIMETRAZINE TARTRATE ER 105 MG PO CP24: 105.0000 mg | ORAL_CAPSULE | Freq: Every day | ORAL | 1 refills | Status: DC

## 2022-02-14 NOTE — Progress Notes (Signed)
[facility] [place], [place] [address]  Internal MEDICINE  Office Visit Note  Patient Name: Rhonda Sanders  [id number]  [id number]  Date of Service: 02/14/2022  Chief Complaint  Patient presents with   Follow-up    Weight loss    Hyperlipidemia   Hypertension    HPI Rhonda Sanders presents for a follow up visit for weight loss management --taking phendimetrazine --gained 3 lbs, has not been exercising --bought a treadmill, is planning to start using it regularly for exercise.  --knows A1c will improve with weight loss.  --BP stable.    Current Medication: Outpatient Encounter Medications as of 02/14/2022  Medication Sig   amLODipine (NORVASC) 5 MG tablet Take 1 tablet (5 mg total) by mouth daily.   cetirizine (ZYRTEC) 10 MG tablet Take 1 tablet (10 mg total) by mouth daily.   fluticasone (FLONASE) 50 MCG/ACT nasal spray Place 2 sprays into both nostrils daily.   hydrochlorothiazide (HYDRODIURIL) 25 MG tablet Take 1 tablet (25 mg total) by mouth daily.   lisinopril (ZESTRIL) 40 MG tablet Take 1 tablet (40 mg total) by mouth daily.   montelukast (SINGULAIR) 10 MG tablet Take 1 tablet (10 mg total) by mouth at bedtime.   Multiple Vitamin (MULTIVITAMIN) tablet Take 1 tablet by mouth daily.   rosuvastatin (CRESTOR) 5 MG tablet Take 1 tablet (5 mg total) by mouth daily.   venlafaxine (EFFEXOR) 75 MG tablet Take 1 tablet (75 mg total) by mouth daily.   [DISCONTINUED] Phendimetrazine Tartrate 105 MG CP24 Take 1 capsule (105 mg total) by mouth daily before breakfast.   Phendimetrazine Tartrate 105 MG CP24 Take 1 capsule (105 mg total) by mouth daily before breakfast.   [START ON 03/14/2022] Phendimetrazine Tartrate 105 MG CP24 Take 1 capsule (105 mg total) by mouth daily before breakfast.   No facility-administered encounter medications on file as of 02/14/2022.    Surgical History: Past Surgical History:  Procedure Laterality Date   ABDOMINAL HYSTERECTOMY   04/2006   partial   BREAST BIOPSY Right 2014   CORE W/CLIP - NEG   BREAST BIOPSY Right 2015   CORE W/OUT CLIP - NEG COLUMNAR CELL CHANGE WITH MICRO CALCIFICATIONS.    BREAST EXCISIONAL BIOPSY Right 2010   EXCISIONAL -ALH, columnar cell hyperplasia   COLONOSCOPY WITH PROPOFOL N/A 05/26/2015   Procedure: COLONOSCOPY WITH PROPOFOL;  Surgeon: Robert Bellow, MD;  Location: [facility] ENDOSCOPY;  Service: Endoscopy;  Laterality: N/A;    Medical History: Past Medical History:  Diagnosis Date   Actinic keratosis 07/18/2012   R distal med pretibial   Allergy    seasonal    Atypical lobular hyperplasia of right breast 12/25/2008   Sngle focus noted in area of columnar cell hyperplasia   Basal cell carcinoma 06/19/2006   sternum   BCC (basal cell carcinoma of skin) 05/17/2009   L lat chest sup to breast   BCC (basal cell carcinoma of skin) 05/17/2009   R med mid calf   BCC (basal cell carcinoma of skin) 06/02/2013   R sup distal lat pretibial   BCC (basal cell carcinoma of skin) 06/02/2013   R inf distal lat pretibial   BCC (basal cell carcinoma of skin) 12/08/2015   R inf med knee   BCC (basal cell carcinoma of skin) 12/08/2015   R mid to distal lat pretibial   BCC (basal cell carcinoma of skin) 05/03/2016   L lat pretibial below knee   BCC (basal cell carcinoma of skin)  05/03/2016   L prox calf   BCC (basal cell carcinoma of skin) 05/03/2016   R prox calf prox   BCC (basal cell carcinoma of skin) 05/03/2016   R prox calf distal   Hypercholesterolemia    Hypertension    PONV (postoperative nausea and vomiting)     Family History: Family History  Problem Relation Age of Onset   Breast cancer Mother 104   Heart disease Father        first MI (54s), died age 77 - MI   Heart disease Paternal Grandfather    Heart disease Paternal Uncle    CVA Paternal Grandmother    Breast cancer Maternal Aunt 40   Crohn's disease Brother    Stomach cancer Maternal Grandfather    Colon cancer  Neg Hx     Social History   Socioeconomic History   Marital status: Single    Spouse name: Not on file   Number of children: 0   Years of education: Not on file   Highest education level: Not on file  Occupational History   Not on file  Tobacco Use   Smoking status: Never   Smokeless tobacco: Never  Vaping Use   Vaping Use: Never used  Substance and Sexual Activity   Alcohol use: Yes    Alcohol/week: 0.0 standard drinks of alcohol    Comment: occasionally   Drug use: No   Sexual activity: Not on file  Other Topics Concern   Not on file  Social History Narrative   Not on file   Social Determinants of Health   Financial Resource Strain: Not on file  Food Insecurity: Not on file  Transportation Needs: Not on file  Physical Activity: Not on file  Stress: Not on file  Social Connections: Not on file  Intimate Partner Violence: Not on file      Review of Systems  Constitutional:  Negative for chills, fatigue and unexpected weight change.  HENT:  Negative for congestion, rhinorrhea, sneezing and sore throat.   Eyes:  Negative for redness.  Respiratory: Negative.  Negative for cough, chest tightness, shortness of breath and wheezing.   Cardiovascular: Negative.  Negative for chest pain and palpitations.  Gastrointestinal:  Negative for abdominal pain, constipation, diarrhea, nausea and vomiting.  Genitourinary:  Negative for dysuria and frequency.  Musculoskeletal:  Negative for arthralgias, back pain, joint swelling and neck pain.  Skin:  Negative for rash.  Neurological: Negative.  Negative for tremors and numbness.  Hematological:  Negative for adenopathy. Does not bruise/bleed easily.  Psychiatric/Behavioral:  Negative for behavioral problems (Depression), sleep disturbance and suicidal ideas. The patient is not nervous/anxious.     Vital Signs: BP (!) 140/74   Pulse 98   Temp 98.1 F (36.7 C)   Resp 16   Ht 5' 3.5" (1.613 m)   Wt 188 lb 12.8 oz (85.6 kg)    SpO2 96%   BMI 32.92 kg/m    Physical Exam Vitals reviewed.  Constitutional:      General: She is not in acute distress.    Appearance: Normal appearance. She is obese. She is not ill-appearing.  HENT:     Head: Normocephalic and atraumatic.  Eyes:     Pupils: Pupils are equal, round, and reactive to light.  Cardiovascular:     Rate and Rhythm: Normal rate and regular rhythm.  Pulmonary:     Effort: Pulmonary effort is normal. No respiratory distress.  Neurological:     Mental Status:  She is alert and oriented to person, place, and time.  Psychiatric:        Mood and Affect: Mood normal.        Behavior: Behavior normal.        Assessment/Plan: 1. Prediabetes Will improve with weight loss  2. Essential hypertension Stable with current meds, continue as prescribed .  3. Class 1 obesity with body mass index (BMI) of 32.0 to 32.9 in adult, unspecified obesity type, unspecified whether serious comorbidity present Continue phendimetrazine as prescribed for 2 months will follow up after the holidays in early february - Phendimetrazine Tartrate 105 MG CP24; Take 1 capsule (105 mg total) by mouth daily before breakfast.  Dispense: 30 capsule; Refill: 1 - Phendimetrazine Tartrate 105 MG CP24; Take 1 capsule (105 mg total) by mouth daily before breakfast.  Dispense: 30 capsule; Refill: 1   General Counseling: Rhonda Sanders understanding of the findings of todays visit and agrees with plan of treatment. I have discussed any further diagnostic evaluation that may be needed or ordered today. We also reviewed her medications today. she has been encouraged to call the office with any questions or concerns that should arise related to todays visit.    No orders of the defined types were placed in this encounter.   Meds ordered this encounter  Medications   Phendimetrazine Tartrate 105 MG CP24    Sig: Take 1 capsule (105 mg total) by mouth daily before breakfast.    Dispense:   30 capsule    Refill:  1    Do not run through insurance, patient has a good rx coupon she will bring to pharmacy   Phendimetrazine Tartrate 105 MG CP24    Sig: Take 1 capsule (105 mg total) by mouth daily before breakfast.    Dispense:  30 capsule    Refill:  1    Do not run through insurance, patient has a good rx coupon she will bring to pharmacy    Return in about 2 months (around 04/17/2022) for F/U, Weight loss, Rhonda Sanders PCP.   Total time spent:30 Minutes Time spent includes review of chart, medications, test results, and follow up plan with the patient.   [place] Controlled Substance Database was reviewed by me.  This patient was seen by Jonetta Osgood, FNP-C in collaboration with Dr. Clayborn Bigness as a part of collaborative care agreement.   Miki Blank R. Valetta Fuller, MSN, FNP-C Internal medicine

## 2022-02-22 ENCOUNTER — Other Ambulatory Visit: Payer: Self-pay | Admitting: Nurse Practitioner

## 2022-02-22 DIAGNOSIS — E669 Obesity, unspecified: Secondary | ICD-10-CM

## 2022-03-13 ENCOUNTER — Telehealth: Payer: Self-pay

## 2022-03-13 NOTE — Telephone Encounter (Signed)
Patient was denied for [employer]bels[employer].

## 2022-04-02 ENCOUNTER — Other Ambulatory Visit: Payer: Self-pay | Admitting: Nurse Practitioner

## 2022-04-02 DIAGNOSIS — I10 Essential (primary) hypertension: Secondary | ICD-10-CM

## 2022-04-17 ENCOUNTER — Other Ambulatory Visit: Payer: Self-pay | Admitting: Nurse Practitioner

## 2022-04-17 DIAGNOSIS — I10 Essential (primary) hypertension: Secondary | ICD-10-CM

## 2022-04-18 ENCOUNTER — Encounter: Payer: Self-pay | Admitting: Nurse Practitioner

## 2022-04-18 ENCOUNTER — Ambulatory Visit: Payer: BC Managed Care – PPO | Admitting: Nurse Practitioner

## 2022-04-18 VITALS — BP 140/82 | HR 100 | Temp 98.7°F | Resp 16 | Ht 63.5 in | Wt 186.2 lb

## 2022-04-18 DIAGNOSIS — Z6832 Body mass index (BMI) 32.0-32.9, adult: Secondary | ICD-10-CM

## 2022-04-18 DIAGNOSIS — I10 Essential (primary) hypertension: Secondary | ICD-10-CM

## 2022-04-18 DIAGNOSIS — E669 Obesity, unspecified: Secondary | ICD-10-CM

## 2022-04-18 DIAGNOSIS — R7303 Prediabetes: Secondary | ICD-10-CM | POA: Diagnosis not present

## 2022-04-18 MED ORDER — PHENDIMETRAZINE TARTRATE ER 105 MG PO CP24: 105.0000 mg | ORAL_CAPSULE | Freq: Every day | ORAL | 1 refills | Status: DC

## 2022-04-18 NOTE — Progress Notes (Signed)
[facility] [place], [place] [address]  Internal MEDICINE  Office Visit Note  Patient Name: Rhonda Sanders  [id number]  [id number]  Date of Service: 04/18/2022  Chief Complaint  Patient presents with   Follow-up    Weight loss     HPI Rhonda Sanders presents for a follow-up visit for weight loss, prediabetes, and hypertension.  Elevated blood pressure, improved when rechecked  Lost 2 lbs through the holidays --wants to continue phendimetrazine  Prediabetes.     Current Medication: Outpatient Encounter Medications as of 04/18/2022  Medication Sig   amLODipine (NORVASC) 5 MG tablet Take 1 tablet (5 mg total) by mouth daily.   cetirizine (ZYRTEC) 10 MG tablet Take 1 tablet (10 mg total) by mouth daily.   fluticasone (FLONASE) 50 MCG/ACT nasal spray Place 2 sprays into both nostrils daily.   hydrochlorothiazide (HYDRODIURIL) 25 MG tablet Take 1 tablet by mouth once daily   lisinopril (ZESTRIL) 40 MG tablet Take 1 tablet by mouth once daily   montelukast (SINGULAIR) 10 MG tablet Take 1 tablet (10 mg total) by mouth at bedtime.   Multiple Vitamin (MULTIVITAMIN) tablet Take 1 tablet by mouth daily.   rosuvastatin (CRESTOR) 5 MG tablet Take 1 tablet (5 mg total) by mouth daily.   venlafaxine (EFFEXOR) 75 MG tablet Take 1 tablet (75 mg total) by mouth daily.   [DISCONTINUED] Phendimetrazine Tartrate 105 MG CP24 Take 1 capsule (105 mg total) by mouth daily before breakfast.   [DISCONTINUED] Phendimetrazine Tartrate 105 MG CP24 Take 1 capsule (105 mg total) by mouth daily before breakfast.   Phendimetrazine Tartrate 105 MG CP24 Take 1 capsule (105 mg total) by mouth daily before breakfast.   [START ON 05/16/2022] Phendimetrazine Tartrate 105 MG CP24 Take 1 capsule (105 mg total) by mouth daily before breakfast.   No facility-administered encounter medications on file as of 04/18/2022.    Surgical History: Past Surgical History:  Procedure Laterality Date   ABDOMINAL  HYSTERECTOMY  04/2006   partial   BREAST BIOPSY Right 2014   CORE W/CLIP - NEG   BREAST BIOPSY Right 2015   CORE W/OUT CLIP - NEG COLUMNAR CELL CHANGE WITH MICRO CALCIFICATIONS.    BREAST EXCISIONAL BIOPSY Right 2010   EXCISIONAL -ALH, columnar cell hyperplasia   COLONOSCOPY WITH PROPOFOL N/A 05/26/2015   Procedure: COLONOSCOPY WITH PROPOFOL;  Surgeon: Robert Bellow, MD;  Location: [facility] ENDOSCOPY;  Service: Endoscopy;  Laterality: N/A;    Medical History: Past Medical History:  Diagnosis Date   Actinic keratosis 07/18/2012   R distal med pretibial   Allergy    seasonal    Atypical lobular hyperplasia of right breast 12/25/2008   Sngle focus noted in area of columnar cell hyperplasia   Basal cell carcinoma 06/19/2006   sternum   BCC (basal cell carcinoma of skin) 05/17/2009   L lat chest sup to breast   BCC (basal cell carcinoma of skin) 05/17/2009   R med mid calf   BCC (basal cell carcinoma of skin) 06/02/2013   R sup distal lat pretibial   BCC (basal cell carcinoma of skin) 06/02/2013   R inf distal lat pretibial   BCC (basal cell carcinoma of skin) 12/08/2015   R inf med knee   BCC (basal cell carcinoma of skin) 12/08/2015   R mid to distal lat pretibial   BCC (basal cell carcinoma of skin) 05/03/2016   L lat pretibial below knee   BCC (basal cell carcinoma of skin) 05/03/2016  L prox calf   BCC (basal cell carcinoma of skin) 05/03/2016   R prox calf prox   BCC (basal cell carcinoma of skin) 05/03/2016   R prox calf distal   Hypercholesterolemia    Hypertension    PONV (postoperative nausea and vomiting)     Family History: Family History  Problem Relation Age of Onset   Breast cancer Mother 5   Heart disease Father        first MI (69s), died age 70 - MI   Heart disease Paternal Grandfather    Heart disease Paternal Uncle    CVA Paternal Grandmother    Breast cancer Maternal Aunt 40   Crohn's disease Brother    Stomach cancer Maternal Grandfather     Colon cancer Neg Hx     Social History   Socioeconomic History   Marital status: Single    Spouse name: Not on file   Number of children: 0   Years of education: Not on file   Highest education level: Not on file  Occupational History   Not on file  Tobacco Use   Smoking status: Never   Smokeless tobacco: Never  Vaping Use   Vaping Use: Never used  Substance and Sexual Activity   Alcohol use: Yes    Alcohol/week: 0.0 standard drinks of alcohol    Comment: occasionally   Drug use: No   Sexual activity: Not on file  Other Topics Concern   Not on file  Social History Narrative   Not on file   Social Determinants of Health   Financial Resource Strain: Not on file  Food Insecurity: Not on file  Transportation Needs: Not on file  Physical Activity: Not on file  Stress: Not on file  Social Connections: Not on file  Intimate Partner Violence: Not on file      Review of Systems  Constitutional:  Negative for chills, fatigue and unexpected weight change.  HENT:  Negative for congestion, rhinorrhea, sneezing and sore throat.   Eyes:  Negative for redness.  Respiratory: Negative.  Negative for cough, chest tightness, shortness of breath and wheezing.   Cardiovascular: Negative.  Negative for chest pain and palpitations.  Gastrointestinal:  Negative for abdominal pain, constipation, diarrhea, nausea and vomiting.  Genitourinary:  Negative for dysuria and frequency.  Musculoskeletal:  Negative for arthralgias, back pain, joint swelling and neck pain.  Skin:  Negative for rash.  Neurological: Negative.  Negative for tremors and numbness.  Hematological:  Negative for adenopathy. Does not bruise/bleed easily.  Psychiatric/Behavioral:  Negative for behavioral problems (Depression), sleep disturbance and suicidal ideas. The patient is not nervous/anxious.     Vital Signs: BP (!) 140/82 Comment: 151/85  Pulse 100   Temp 98.7 F (37.1 C)   Resp 16   Ht 5' 3.5" (1.613 m)    Wt 186 lb 3.2 oz (84.5 kg)   SpO2 95%   BMI 32.47 kg/m    Physical Exam Vitals reviewed.  Constitutional:      General: She is not in acute distress.    Appearance: Normal appearance. She is obese. She is not ill-appearing.  HENT:     Head: Normocephalic and atraumatic.  Eyes:     Pupils: Pupils are equal, round, and reactive to light.  Cardiovascular:     Rate and Rhythm: Normal rate and regular rhythm.  Pulmonary:     Effort: Pulmonary effort is normal. No respiratory distress.  Neurological:     Mental Status: She is  alert and oriented to person, place, and time.  Psychiatric:        Mood and Affect: Mood normal.        Behavior: Behavior normal.        Assessment/Plan: 1. Prediabetes Weight loss will help to improve her A1c and glucose levels. Will repeat A1c at a later visit.   2. Essential hypertension Stable, continue current medications as prescribed.   3. Class 1 obesity with body mass index (BMI) of 32.0 to 32.9 in adult, unspecified obesity type, unspecified whether serious comorbidity present Continue phendimetrazine, follow up in 2 months.  - Phendimetrazine Tartrate 105 MG CP24; Take 1 capsule (105 mg total) by mouth daily before breakfast.  Dispense: 30 capsule; Refill: 1 - Phendimetrazine Tartrate 105 MG CP24; Take 1 capsule (105 mg total) by mouth daily before breakfast.  Dispense: 30 capsule; Refill: 1   General Counseling: najee beardmore understanding of the findings of todays visit and agrees with plan of treatment. I have discussed any further diagnostic evaluation that may be needed or ordered today. We also reviewed her medications today. she has been encouraged to call the office with any questions or concerns that should arise related to todays visit.    No orders of the defined types were placed in this encounter.   Meds ordered this encounter  Medications   Phendimetrazine Tartrate 105 MG CP24    Sig: Take 1 capsule (105 mg total) by  mouth daily before breakfast.    Dispense:  30 capsule    Refill:  1    Do not run through insurance, patient has a good rx coupon she will bring to pharmacy   Phendimetrazine Tartrate 105 MG CP24    Sig: Take 1 capsule (105 mg total) by mouth daily before breakfast.    Dispense:  30 capsule    Refill:  1    Do not run through insurance, patient has a good rx coupon she will bring to pharmacy    Return in about 8 weeks (around 06/13/2022) for F/U, Weight loss, Maryfrances Portugal PCP.   Total time spent:30 Minutes Time spent includes review of chart, medications, test results, and follow up plan with the patient.   [place] Controlled Substance Database was reviewed by me.  This patient was seen by Jonetta Osgood, FNP-C in collaboration with Dr. Clayborn Bigness as a part of collaborative care agreement.   Tazaria Dlugosz R. Valetta Fuller, MSN, FNP-C Internal medicine

## 2022-04-29 ENCOUNTER — Encounter: Payer: Self-pay | Admitting: Nurse Practitioner

## 2022-05-05 ENCOUNTER — Other Ambulatory Visit: Payer: Self-pay | Admitting: Nurse Practitioner

## 2022-05-05 DIAGNOSIS — J309 Allergic rhinitis, unspecified: Secondary | ICD-10-CM

## 2022-05-11 ENCOUNTER — Other Ambulatory Visit: Payer: Self-pay | Admitting: Nurse Practitioner

## 2022-05-11 DIAGNOSIS — I10 Essential (primary) hypertension: Secondary | ICD-10-CM

## 2022-05-23 ENCOUNTER — Other Ambulatory Visit: Payer: Self-pay | Admitting: Nurse Practitioner

## 2022-05-23 DIAGNOSIS — E782 Mixed hyperlipidemia: Secondary | ICD-10-CM

## 2022-06-03 ENCOUNTER — Other Ambulatory Visit: Payer: Self-pay | Admitting: Internal Medicine

## 2022-06-03 DIAGNOSIS — J309 Allergic rhinitis, unspecified: Secondary | ICD-10-CM

## 2022-06-13 ENCOUNTER — Ambulatory Visit: Payer: BC Managed Care – PPO | Admitting: Nurse Practitioner

## 2022-06-13 ENCOUNTER — Encounter: Payer: Self-pay | Admitting: Nurse Practitioner

## 2022-06-13 VITALS — BP 144/90 | HR 99 | Temp 98.5°F | Resp 16 | Ht 63.5 in | Wt 190.8 lb

## 2022-06-13 DIAGNOSIS — E669 Obesity, unspecified: Secondary | ICD-10-CM | POA: Diagnosis not present

## 2022-06-13 DIAGNOSIS — N951 Menopausal and female climacteric states: Secondary | ICD-10-CM | POA: Diagnosis not present

## 2022-06-13 DIAGNOSIS — R635 Abnormal weight gain: Secondary | ICD-10-CM | POA: Diagnosis not present

## 2022-06-13 DIAGNOSIS — I10 Essential (primary) hypertension: Secondary | ICD-10-CM

## 2022-06-13 DIAGNOSIS — Z6832 Body mass index (BMI) 32.0-32.9, adult: Secondary | ICD-10-CM

## 2022-06-13 MED ORDER — DIETHYLPROPION HCL ER 75 MG PO TB24: 75.0000 mg | ORAL_TABLET | Freq: Every day | ORAL | 0 refills | Status: DC

## 2022-06-13 MED ORDER — HYDROCHLOROTHIAZIDE 25 MG PO TABS: 25.0000 mg | ORAL_TABLET | Freq: Every day | ORAL | 3 refills | Status: DC

## 2022-06-13 MED ORDER — VENLAFAXINE HCL 75 MG PO TABS: 75.0000 mg | ORAL_TABLET | Freq: Every day | ORAL | 1 refills | Status: DC

## 2022-06-13 MED ORDER — AMLODIPINE BESYLATE 5 MG PO TABS: 5.0000 mg | ORAL_TABLET | Freq: Every day | ORAL | 3 refills | Status: DC

## 2022-06-13 MED ORDER — LISINOPRIL 40 MG PO TABS: 40.0000 mg | ORAL_TABLET | Freq: Every day | ORAL | 3 refills | Status: DC

## 2022-06-13 NOTE — Progress Notes (Signed)
[facility] [address] [place], [place] [address]  Internal MEDICINE  Office Visit Note  Patient Name: Rhonda Sanders  [id number]  [id number]  Date of Service: 06/13/2022  Chief Complaint  Patient presents with   Hyperlipidemia   Hypertension   Follow-up    Weight loss     HPI Rhonda Sanders presents for a follow-up visit for weight loss  Walking 2 miles 4 x weekly Been taking phendimetrazine but it is on backorder. And has not been helping as much.  Insurance will not cover some of the more expensive injectable medications used for weight loss.  Needs a few refills.    Current Medication: Outpatient Encounter Medications as of 06/13/2022  Medication Sig   Diethylpropion HCl CR 75 MG TB24 Take 1 tablet (75 mg total) by mouth daily before breakfast.   EQ ALLERGY RELIEF, CETIRIZINE, 10 MG tablet Take 1 tablet by mouth once daily   fluticasone (FLONASE) 50 MCG/ACT nasal spray Place 2 sprays into both nostrils daily.   montelukast (SINGULAIR) 10 MG tablet TAKE 1 TABLET BY MOUTH AT BEDTIME   Multiple Vitamin (MULTIVITAMIN) tablet Take 1 tablet by mouth daily.   rosuvastatin (CRESTOR) 5 MG tablet Take 1 tablet by mouth once daily   [DISCONTINUED] amLODipine (NORVASC) 5 MG tablet Take 1 tablet by mouth once daily   [DISCONTINUED] Diethylpropion HCl CR 75 MG TB24 Take 1 tablet (75 mg total) by mouth daily before breakfast.   [DISCONTINUED] hydrochlorothiazide (HYDRODIURIL) 25 MG tablet Take 1 tablet by mouth once daily   [DISCONTINUED] lisinopril (ZESTRIL) 40 MG tablet Take 1 tablet by mouth once daily   [DISCONTINUED] Phendimetrazine Tartrate 105 MG CP24 Take 1 capsule (105 mg total) by mouth daily before breakfast.   [DISCONTINUED] Phendimetrazine Tartrate 105 MG CP24 Take 1 capsule (105 mg total) by mouth daily before breakfast.   [DISCONTINUED] venlafaxine (EFFEXOR) 75 MG tablet Take 1 tablet (75 mg total) by mouth daily.   amLODipine (NORVASC) 5 MG tablet Take 1 tablet (5 mg  total) by mouth daily.   Diethylpropion HCl CR 75 MG TB24 Take 1 tablet (75 mg total) by mouth daily before breakfast.   hydrochlorothiazide (HYDRODIURIL) 25 MG tablet Take 1 tablet (25 mg total) by mouth daily.   lisinopril (ZESTRIL) 40 MG tablet Take 1 tablet (40 mg total) by mouth daily.   venlafaxine (EFFEXOR) 75 MG tablet Take 1 tablet (75 mg total) by mouth daily.   No facility-administered encounter medications on file as of 06/13/2022.    Surgical History: Past Surgical History:  Procedure Laterality Date   ABDOMINAL HYSTERECTOMY  04/2006   partial   BREAST BIOPSY Right 2014   CORE W/CLIP - NEG   BREAST BIOPSY Right 2015   CORE W/OUT CLIP - NEG COLUMNAR CELL CHANGE WITH MICRO CALCIFICATIONS.    BREAST EXCISIONAL BIOPSY Right 2010   EXCISIONAL -ALH, columnar cell hyperplasia   COLONOSCOPY WITH PROPOFOL N/A 05/26/2015   Procedure: COLONOSCOPY WITH PROPOFOL;  Surgeon: Earline Mayotte, MD;  Location[facility] ENDOSCOPY;  Service: Endoscopy;  Laterality: N/A;    Medical History: Past Medical History:  Diagnosis Date   Actinic keratosis 07/18/2012   R distal med pretibial   Allergy    seasonal    Atypical lobular hyperplasia of right breast 12/25/2008   Sngle focus noted in area of columnar cell hyperplasia   Basal cell carcinoma 06/19/2006   sternum   BCC (basal cell carcinoma of skin) 05/17/2009   L lat chest sup to  breast   BCC (basal cell carcinoma of skin) 05/17/2009   R med mid calf   BCC (basal cell carcinoma of skin) 06/02/2013   R sup distal lat pretibial   BCC (basal cell carcinoma of skin) 06/02/2013   R inf distal lat pretibial   BCC (basal cell carcinoma of skin) 12/08/2015   R inf med knee   BCC (basal cell carcinoma of skin) 12/08/2015   R mid to distal lat pretibial   BCC (basal cell carcinoma of skin) 05/03/2016   L lat pretibial below knee   BCC (basal cell carcinoma of skin) 05/03/2016   L prox calf   BCC (basal cell carcinoma of skin) 05/03/2016   R  prox calf prox   BCC (basal cell carcinoma of skin) 05/03/2016   R prox calf distal   Hypercholesterolemia    Hypertension    PONV (postoperative nausea and vomiting)     Family History: Family History  Problem Relation Age of Onset   Breast cancer Mother 80   Heart disease Father        first MI (5s), died age 37 - MI   Heart disease Paternal Grandfather    Heart disease Paternal Uncle    CVA Paternal Grandmother    Breast cancer Maternal Aunt 40   Crohn's disease Brother    Stomach cancer Maternal Grandfather    Colon cancer Neg Hx     Social History   Socioeconomic History   Marital status: Single    Spouse name: Not on file   Number of children: 0   Years of education: Not on file   Highest education level: Not on file  Occupational History   Not on file  Tobacco Use   Smoking status: Never   Smokeless tobacco: Never  Vaping Use   Vaping Use: Never used  Substance and Sexual Activity   Alcohol use: Yes    Alcohol/week: 0.0 standard drinks of alcohol    Comment: occasionally   Drug use: No   Sexual activity: Not on file  Other Topics Concern   Not on file  Social History Narrative   Not on file   Social Determinants of Health   Financial Resource Strain: Not on file  Food Insecurity: Not on file  Transportation Needs: Not on file  Physical Activity: Not on file  Stress: Not on file  Social Connections: Not on file  Intimate Partner Violence: Not on file      Review of Systems  Constitutional:  Negative for chills, fatigue and unexpected weight change.  HENT:  Negative for congestion, rhinorrhea, sneezing and sore throat.   Eyes:  Negative for redness.  Respiratory: Negative.  Negative for cough, chest tightness, shortness of breath and wheezing.   Cardiovascular: Negative.  Negative for chest pain and palpitations.  Gastrointestinal:  Negative for abdominal pain, constipation, diarrhea, nausea and vomiting.  Genitourinary:  Negative for dysuria  and frequency.  Musculoskeletal:  Negative for arthralgias, back pain, joint swelling and neck pain.  Skin:  Negative for rash.  Neurological: Negative.  Negative for tremors and numbness.  Hematological:  Negative for adenopathy. Does not bruise/bleed easily.  Psychiatric/Behavioral:  Negative for behavioral problems (Depression), sleep disturbance and suicidal ideas. The patient is not nervous/anxious.     Vital Signs: BP (!) 144/90   Pulse 99   Temp 98.5 F (36.9 C)   Resp 16   Ht 5' 3.5" (1.613 m)   Wt 190 lb 12.8 oz (86.5 kg)  SpO2 99%   BMI 33.27 kg/m    Physical Exam Vitals reviewed.  Constitutional:      General: She is not in acute distress.    Appearance: Normal appearance. She is obese. She is not ill-appearing.  HENT:     Head: Normocephalic and atraumatic.  Eyes:     Pupils: Pupils are equal, round, and reactive to light.  Cardiovascular:     Rate and Rhythm: Normal rate and regular rhythm.  Pulmonary:     Effort: Pulmonary effort is normal. No respiratory distress.  Neurological:     Mental Status: She is alert and oriented to person, place, and time.  Psychiatric:        Mood and Affect: Mood normal.        Behavior: Behavior normal.        Assessment/Plan: 1. Vasomotor symptoms due to menopause Symptoms are well controlled, Continue venlafaxine as prescribed.  - venlafaxine (EFFEXOR) 75 MG tablet; Take 1 tablet (75 mg total) by mouth daily.  Dispense: 90 tablet; Refill: 1  2. Essential hypertension Stable, continue lisinopril and amlodipine as prescribed.  - lisinopril (ZESTRIL) 40 MG tablet; Take 1 tablet (40 mg total) by mouth daily.  Dispense: 90 tablet; Refill: 3 - hydrochlorothiazide (HYDRODIURIL) 25 MG tablet; Take 1 tablet (25 mg total) by mouth daily.  Dispense: 90 tablet; Refill: 3 - amLODipine (NORVASC) 5 MG tablet; Take 1 tablet (5 mg total) by mouth daily.  Dispense: 90 tablet; Refill: 3  3. Abnormal weight gain Phendimetrazine  discontinued, start diethylpropion as prescribed. Follow up in 8 weeks.  - Diethylpropion HCl CR 75 MG TB24; Take 1 tablet (75 mg total) by mouth daily before breakfast.  Dispense: 30 tablet; Refill: 0 - Diethylpropion HCl CR 75 MG TB24; Take 1 tablet (75 mg total) by mouth daily before breakfast.  Dispense: 30 tablet; Refill: 0  4. Class 1 obesity with body mass index (BMI) of 32.0 to 32.9 in adult, unspecified obesity type, unspecified whether serious comorbidity present Switch medication to diethylpropion, follow up in 8 weeks. - Diethylpropion HCl CR 75 MG TB24; Take 1 tablet (75 mg total) by mouth daily before breakfast.  Dispense: 30 tablet; Refill: 0 - Diethylpropion HCl CR 75 MG TB24; Take 1 tablet (75 mg total) by mouth daily before breakfast.  Dispense: 30 tablet; Refill: 0    General Counseling: Marya FossaDonna verbalizes understanding of the findings of todays visit and agrees with plan of treatment. I have discussed any further diagnostic evaluation that may be needed or ordered today. We also reviewed her medications today. she has been encouraged to call the office with any questions or concerns that should arise related to todays visit.    No orders of the defined types were placed in this encounter.   Meds ordered this encounter  Medications   Diethylpropion HCl CR 75 MG TB24    Sig: Take 1 tablet (75 mg total) by mouth daily before breakfast.    Dispense:  30 tablet    Refill:  0    Fill with good rx coupon, do not run through insurance.   DISCONTD: Diethylpropion HCl CR 75 MG TB24    Sig: Take 1 tablet (75 mg total) by mouth daily before breakfast.    Dispense:  30 tablet    Refill:  0    Fill with good rx coupon, do not run through insurance.   venlafaxine (EFFEXOR) 75 MG tablet    Sig: Take 1 tablet (75 mg total)  by mouth daily.    Dispense:  90 tablet    Refill:  1   Diethylpropion HCl CR 75 MG TB24    Sig: Take 1 tablet (75 mg total) by mouth daily before breakfast.     Dispense:  30 tablet    Refill:  0    Fill with good rx coupon, do not run through insurance.   lisinopril (ZESTRIL) 40 MG tablet    Sig: Take 1 tablet (40 mg total) by mouth daily.    Dispense:  90 tablet    Refill:  3   hydrochlorothiazide (HYDRODIURIL) 25 MG tablet    Sig: Take 1 tablet (25 mg total) by mouth daily.    Dispense:  90 tablet    Refill:  3   amLODipine (NORVASC) 5 MG tablet    Sig: Take 1 tablet (5 mg total) by mouth daily.    Dispense:  90 tablet    Refill:  3    Return in about 8 weeks (around 08/08/2022) for F/U, Weight loss, Lamari Youngers PCP.   Total time spent:30 Minutes Time spent includes review of chart, medications, test results, and follow up plan with the patient.   [place] Controlled Substance Database was reviewed by me.  This patient was seen by Sallyanne Kuster, FNP-C in collaboration with Dr. Beverely Risen as a part of collaborative care agreement.   Colleene Swarthout R. Tedd Sias, MSN, FNP-C Internal medicine

## 2022-06-20 ENCOUNTER — Other Ambulatory Visit: Payer: Self-pay | Admitting: Nurse Practitioner

## 2022-06-20 DIAGNOSIS — Z1231 Encounter for screening mammogram for malignant neoplasm of breast: Secondary | ICD-10-CM

## 2022-06-24 ENCOUNTER — Encounter: Payer: Self-pay | Admitting: Nurse Practitioner

## 2022-06-27 ENCOUNTER — Other Ambulatory Visit: Payer: Self-pay | Admitting: Nurse Practitioner

## 2022-06-27 DIAGNOSIS — I10 Essential (primary) hypertension: Secondary | ICD-10-CM

## 2022-07-13 ENCOUNTER — Ambulatory Visit
Admission: RE | Admit: 2022-07-13 | Discharge: 2022-07-13 | Disposition: A | Discharge status: Home / Self Care | Payer: BC Managed Care – PPO | Source: Ambulatory Visit | Attending: Nurse Practitioner | Admitting: Nurse Practitioner

## 2022-07-13 DIAGNOSIS — Z1231 Encounter for screening mammogram for malignant neoplasm of breast: Secondary | ICD-10-CM | POA: Insufficient documentation

## 2022-07-16 ENCOUNTER — Other Ambulatory Visit: Payer: Self-pay | Admitting: Nurse Practitioner

## 2022-07-16 DIAGNOSIS — I10 Essential (primary) hypertension: Secondary | ICD-10-CM

## 2022-07-18 ENCOUNTER — Other Ambulatory Visit: Payer: Self-pay | Admitting: Nurse Practitioner

## 2022-07-18 DIAGNOSIS — E669 Obesity, unspecified: Secondary | ICD-10-CM

## 2022-07-18 DIAGNOSIS — R635 Abnormal weight gain: Secondary | ICD-10-CM

## 2022-07-18 NOTE — Telephone Encounter (Signed)
Please send

## 2022-08-02 ENCOUNTER — Encounter: Payer: BC Managed Care – PPO | Admitting: Nurse Practitioner

## 2022-08-04 ENCOUNTER — Other Ambulatory Visit: Payer: Self-pay | Admitting: Nurse Practitioner

## 2022-08-04 DIAGNOSIS — I10 Essential (primary) hypertension: Secondary | ICD-10-CM

## 2022-08-05 ENCOUNTER — Other Ambulatory Visit: Payer: Self-pay | Admitting: Nurse Practitioner

## 2022-08-05 DIAGNOSIS — J309 Allergic rhinitis, unspecified: Secondary | ICD-10-CM

## 2022-08-08 ENCOUNTER — Encounter: Payer: Self-pay | Admitting: Nurse Practitioner

## 2022-08-08 ENCOUNTER — Ambulatory Visit: Payer: BC Managed Care – PPO | Admitting: Nurse Practitioner

## 2022-08-08 ENCOUNTER — Ambulatory Visit (INDEPENDENT_AMBULATORY_CARE_PROVIDER_SITE_OTHER): Payer: BC Managed Care – PPO | Admitting: Nurse Practitioner

## 2022-08-08 VITALS — BP 135/75 | HR 99 | Temp 98.3°F | Resp 16 | Ht 63.5 in | Wt 189.2 lb

## 2022-08-08 DIAGNOSIS — I10 Essential (primary) hypertension: Secondary | ICD-10-CM

## 2022-08-08 DIAGNOSIS — R635 Abnormal weight gain: Secondary | ICD-10-CM

## 2022-08-08 DIAGNOSIS — Z6832 Body mass index (BMI) 32.0-32.9, adult: Secondary | ICD-10-CM

## 2022-08-08 DIAGNOSIS — Z0001 Encounter for general adult medical examination with abnormal findings: Secondary | ICD-10-CM | POA: Diagnosis not present

## 2022-08-08 DIAGNOSIS — R7303 Prediabetes: Secondary | ICD-10-CM

## 2022-08-08 DIAGNOSIS — E669 Obesity, unspecified: Secondary | ICD-10-CM | POA: Diagnosis not present

## 2022-08-08 MED ORDER — DIETHYLPROPION HCL ER 75 MG PO TB24: 75.0000 mg | ORAL_TABLET | Freq: Every day | ORAL | 1 refills | Status: DC

## 2022-08-08 NOTE — Progress Notes (Signed)
[facility] [address] [place], [place] [address]  Internal MEDICINE  Office Visit Note  Patient Name: Rhonda Sanders  [id number]  [id number]  Date of Service: 08/08/2022  Chief Complaint  Patient presents with   Hyperlipidemia   Hypertension   Annual Exam    HPI Inette presents for an annual well visit and physical exam.  Well-appearing 62 y.o. female with hypertension, allergic rhinitis, high cholesterol, vitamin D deficiency, prediabetes and menopausal vasomotor symptoms  Routine CRC screening: due 2027 Routine mammogram: done may 2024, results was normal and 2023 results was normal Pap smear: due this year, will do at next follow up  Labs: reminded to have labs done  New or worsening pain: none    Had birthday and went on vacation and did not gain any wegith atually lost 1 lb, feels like clothes are start to fit better. Wants to continue diethylpropion for weight loss.  Prior labs showed prediabetes, will repeat that on labs.    Current Medication: Outpatient Encounter Medications as of 08/08/2022  Medication Sig   amLODipine (NORVASC) 5 MG tablet Take 1 tablet (5 mg total) by mouth daily.   EQ ALLERGY RELIEF, CETIRIZINE, 10 MG tablet Take 1 tablet by mouth once daily   fluticasone (FLONASE) 50 MCG/ACT nasal spray Place 2 sprays into both nostrils daily.   hydrochlorothiazide (HYDRODIURIL) 25 MG tablet Take 1 tablet by mouth once daily   lisinopril (ZESTRIL) 40 MG tablet Take 1 tablet (40 mg total) by mouth daily.   montelukast (SINGULAIR) 10 MG tablet TAKE 1 TABLET BY MOUTH AT BEDTIME   Multiple Vitamin (MULTIVITAMIN) tablet Take 1 tablet by mouth daily.   rosuvastatin (CRESTOR) 5 MG tablet Take 1 tablet by mouth once daily   venlafaxine (EFFEXOR) 75 MG tablet Take 1 tablet (75 mg total) by mouth daily.   [DISCONTINUED] Diethylpropion HCl CR 75 MG TB24 Take 1 tablet (75 mg total) by mouth daily before breakfast.   [DISCONTINUED] Diethylpropion HCl CR 75 MG TB24  TAKE 1 TABLET (75 MG TOTAL) BY MOUTH DAILY BEFORE BREAKFAST.   Diethylpropion HCl CR 75 MG TB24 Take 1 tablet (75 mg total) by mouth daily before breakfast.   No facility-administered encounter medications on file as of 08/08/2022.    Surgical History: Past Surgical History:  Procedure Laterality Date   ABDOMINAL HYSTERECTOMY  04/2006   partial   BREAST BIOPSY Right 2014   CORE W/CLIP - NEG   BREAST BIOPSY Right 2015   CORE W/OUT CLIP - NEG COLUMNAR CELL CHANGE WITH MICRO CALCIFICATIONS.    BREAST EXCISIONAL BIOPSY Right 2010   EXCISIONAL -ALH, columnar cell hyperplasia   COLONOSCOPY WITH PROPOFOL N/A 05/26/2015   Procedure: COLONOSCOPY WITH PROPOFOL;  Surgeon: Earline Mayotte, MD;  Location: [facility] ENDOSCOPY;  Service: Endoscopy;  Laterality: N/A;    Medical History: Past Medical History:  Diagnosis Date   Actinic keratosis 07/18/2012   R distal med pretibial   Allergy    seasonal    Atypical lobular hyperplasia of right breast 12/25/2008   Sngle focus noted in area of columnar cell hyperplasia   Basal cell carcinoma 06/19/2006   sternum   BCC (basal cell carcinoma of skin) 05/17/2009   L lat chest sup to breast   BCC (basal cell carcinoma of skin) 05/17/2009   R med mid calf   BCC (basal cell carcinoma of skin) 06/02/2013   R sup distal lat pretibial   BCC (basal cell carcinoma of skin) 06/02/2013  R inf distal lat pretibial   BCC (basal cell carcinoma of skin) 12/08/2015   R inf med knee   BCC (basal cell carcinoma of skin) 12/08/2015   R mid to distal lat pretibial   BCC (basal cell carcinoma of skin) 05/03/2016   L lat pretibial below knee   BCC (basal cell carcinoma of skin) 05/03/2016   L prox calf   BCC (basal cell carcinoma of skin) 05/03/2016   R prox calf prox   BCC (basal cell carcinoma of skin) 05/03/2016   R prox calf distal   Hypercholesterolemia    Hypertension    PONV (postoperative nausea and vomiting)     Family History: Family History   Problem Relation Age of Onset   Breast cancer Mother 20   Heart disease Father        first MI (51s), died age 40 - MI   Heart disease Paternal Grandfather    Heart disease Paternal Uncle    CVA Paternal Grandmother    Breast cancer Maternal Aunt 40   Crohn's disease Brother    Stomach cancer Maternal Grandfather    Colon cancer Neg Hx     Social History   Socioeconomic History   Marital status: Single    Spouse name: Not on file   Number of children: 0   Years of education: Not on file   Highest education level: Not on file  Occupational History   Not on file  Tobacco Use   Smoking status: Never   Smokeless tobacco: Never  Vaping Use   Vaping Use: Never used  Substance and Sexual Activity   Alcohol use: Yes    Alcohol/week: 0.0 standard drinks of alcohol    Comment: occasionally   Drug use: No   Sexual activity: Not on file  Other Topics Concern   Not on file  Social History Narrative   Not on file   Social Determinants of Health   Financial Resource Strain: Not on file  Food Insecurity: Not on file  Transportation Needs: Not on file  Physical Activity: Not on file  Stress: Not on file  Social Connections: Not on file  Intimate Partner Violence: Not on file      Review of Systems  Constitutional:  Negative for activity change, appetite change, chills, fatigue, fever and unexpected weight change.  HENT: Negative.  Negative for congestion, ear pain, rhinorrhea, sore throat and trouble swallowing.   Eyes: Negative.   Respiratory: Negative.  Negative for cough, chest tightness, shortness of breath and wheezing.   Cardiovascular: Negative.  Negative for chest pain.  Gastrointestinal: Negative.  Negative for abdominal pain, blood in stool, constipation, diarrhea, nausea and vomiting.  Endocrine: Negative.   Genitourinary: Negative.  Negative for difficulty urinating, dysuria, frequency, hematuria and urgency.  Musculoskeletal: Negative.  Negative for  arthralgias, back pain, joint swelling, myalgias and neck pain.  Skin: Negative.  Negative for rash and wound.  Allergic/Immunologic: Negative.  Negative for immunocompromised state.  Neurological: Negative.  Negative for dizziness, seizures, numbness and headaches.  Hematological: Negative.   Psychiatric/Behavioral: Negative.  Negative for behavioral problems, self-injury and suicidal ideas. The patient is not nervous/anxious.     Vital Signs: BP (!) 146/82   Pulse 99   Temp 98.3 F (36.8 C)   Resp 16   Ht 5' 3.5" (1.613 m)   Wt 189 lb 3.2 oz (85.8 kg)   SpO2 97%   BMI 32.99 kg/m    Physical Exam Vitals reviewed.  Constitutional:      General: She is awake. She is not in acute distress.    Appearance: Normal appearance. She is well-developed and well-groomed. She is obese. She is not ill-appearing or diaphoretic.  HENT:     Head: Normocephalic and atraumatic.     Right Ear: Tympanic membrane, ear canal and external ear normal.     Left Ear: Tympanic membrane, ear canal and external ear normal.     Nose: Nose normal.     Mouth/Throat:     Lips: Pink.     Mouth: Mucous membranes are moist.     Pharynx: Oropharynx is clear. Uvula midline. No oropharyngeal exudate or posterior oropharyngeal erythema.  Eyes:     General: Lids are normal. Vision grossly intact. Gaze aligned appropriately. No scleral icterus.       Right eye: No discharge.        Left eye: No discharge.     Conjunctiva/sclera: Conjunctivae normal.     Pupils: Pupils are equal, round, and reactive to light.     Funduscopic exam:    Right eye: Red reflex present.        Left eye: Red reflex present. Neck:     Thyroid: No thyromegaly.     Vascular: No JVD.     Trachea: Trachea and phonation normal. No tracheal deviation.  Cardiovascular:     Rate and Rhythm: Normal rate and regular rhythm.     Pulses: Normal pulses.     Heart sounds: Normal heart sounds, S1 normal and S2 normal. No murmur heard.    No  friction rub. No gallop.  Pulmonary:     Effort: Pulmonary effort is normal. No accessory muscle usage or respiratory distress.     Breath sounds: Normal breath sounds and air entry. No stridor. No wheezing or rales.  Chest:     Chest wall: No tenderness.     Comments: Declined clinical breast exam Abdominal:     General: Bowel sounds are normal. There is no distension.     Palpations: Abdomen is soft. There is no shifting dullness, fluid wave, mass or pulsatile mass.     Tenderness: There is no abdominal tenderness. There is no guarding or rebound.  Musculoskeletal:        General: No tenderness or deformity. Normal range of motion.     Cervical back: Normal range of motion and neck supple.     Right lower leg: No edema.     Left lower leg: No edema.  Lymphadenopathy:     Cervical: No cervical adenopathy.  Skin:    General: Skin is warm and dry.     Coloration: Skin is not pale.     Findings: No erythema or rash.  Neurological:     Mental Status: She is alert.     Cranial Nerves: No cranial nerve deficit.     Motor: No abnormal muscle tone.     Coordination: Coordination normal.     Deep Tendon Reflexes: Reflexes are normal and symmetric.  Psychiatric:        Behavior: Behavior normal. Behavior is cooperative.        Thought Content: Thought content normal.        Judgment: Judgment normal.        Assessment/Plan: 1. Encounter for routine adult health examination with abnormal findings Age-appropriate preventive screenings and vaccinations discussed, annual physical exam completed. Routine labs for health maintenance ordered previously and patient reminded to have labs drawn. PHM updated.  Will have pap smear done at her next office visit.  2. Essential hypertension Stable, continue medication as prescribed.   3. Prediabetes Will reevaluate with upcoming labs   4. Abnormal weight gain Continue diethylpropion as prescribed, follow up in 8 weeks - Diethylpropion HCl CR  75 MG TB24; Take 1 tablet (75 mg total) by mouth daily before breakfast.  Dispense: 30 tablet; Refill: 1  5. Class 1 obesity with body mass index (BMI) of 32.0 to 32.9 in adult, unspecified obesity type, unspecified whether serious comorbidity present Continue diethylpropion as prescribed, follow up in 8 weeks.  - Diethylpropion HCl CR 75 MG TB24; Take 1 tablet (75 mg total) by mouth daily before breakfast.  Dispense: 30 tablet; Refill: 1     General Counseling: niveen ison understanding of the findings of todays visit and agrees with plan of treatment. I have discussed any further diagnostic evaluation that may be needed or ordered today. We also reviewed her medications today. she has been encouraged to call the office with any questions or concerns that should arise related to todays visit.    No orders of the defined types were placed in this encounter.   Meds ordered this encounter  Medications   Diethylpropion HCl CR 75 MG TB24    Sig: Take 1 tablet (75 mg total) by mouth daily before breakfast.    Dispense:  30 tablet    Refill:  1    Fill with good rx coupon, do not run through insurance.    Return in about 8 weeks (around 10/03/2022) for F/U, Weight loss, Ayshia Gramlich PCP and PAP.   Total time spent:30 Minutes Time spent includes review of chart, medications, test results, and follow up plan with the patient.   [place] Controlled Substance Database was reviewed by me.  This patient was seen by Sallyanne Kuster, FNP-C in collaboration with Dr. Beverely Risen as a part of collaborative care agreement.  Mirinda Monte R. Tedd Sias, MSN, FNP-C Internal medicine

## 2022-08-12 ENCOUNTER — Encounter: Payer: Self-pay | Admitting: Nurse Practitioner

## 2022-08-22 ENCOUNTER — Other Ambulatory Visit: Payer: Self-pay | Admitting: Nurse Practitioner

## 2022-08-22 DIAGNOSIS — R635 Abnormal weight gain: Secondary | ICD-10-CM

## 2022-08-22 DIAGNOSIS — E782 Mixed hyperlipidemia: Secondary | ICD-10-CM

## 2022-08-22 DIAGNOSIS — Z6832 Body mass index (BMI) 32.0-32.9, adult: Secondary | ICD-10-CM

## 2022-08-23 NOTE — Telephone Encounter (Signed)
Patient has prescription on hold for this month already sent 08-08-22.

## 2022-08-29 ENCOUNTER — Other Ambulatory Visit: Payer: Self-pay | Admitting: Nurse Practitioner

## 2022-08-29 DIAGNOSIS — E782 Mixed hyperlipidemia: Secondary | ICD-10-CM

## 2022-09-03 ENCOUNTER — Other Ambulatory Visit: Payer: Self-pay | Admitting: Nurse Practitioner

## 2022-09-03 DIAGNOSIS — J309 Allergic rhinitis, unspecified: Secondary | ICD-10-CM

## 2022-09-03 DIAGNOSIS — N951 Menopausal and female climacteric states: Secondary | ICD-10-CM

## 2022-09-09 ENCOUNTER — Other Ambulatory Visit: Payer: Self-pay | Admitting: Nurse Practitioner

## 2022-09-09 DIAGNOSIS — J309 Allergic rhinitis, unspecified: Secondary | ICD-10-CM

## 2022-10-03 ENCOUNTER — Ambulatory Visit: Payer: BC Managed Care – PPO | Admitting: Nurse Practitioner

## 2022-10-09 ENCOUNTER — Telehealth: Payer: Self-pay

## 2022-10-09 DIAGNOSIS — R748 Abnormal levels of other serum enzymes: Secondary | ICD-10-CM

## 2022-10-09 DIAGNOSIS — E559 Vitamin D deficiency, unspecified: Secondary | ICD-10-CM

## 2022-10-09 DIAGNOSIS — R7303 Prediabetes: Secondary | ICD-10-CM

## 2022-10-09 DIAGNOSIS — E782 Mixed hyperlipidemia: Secondary | ICD-10-CM

## 2022-10-11 ENCOUNTER — Ambulatory Visit: Payer: BC Managed Care – PPO | Admitting: Nurse Practitioner

## 2022-10-12 ENCOUNTER — Ambulatory Visit: Payer: BC Managed Care – PPO | Admitting: Nurse Practitioner

## 2022-10-13 NOTE — Telephone Encounter (Signed)
Lmom that we place order for labs do fasting labs

## 2022-10-18 ENCOUNTER — Encounter: Payer: Self-pay | Admitting: Nurse Practitioner

## 2022-10-18 ENCOUNTER — Ambulatory Visit (INDEPENDENT_AMBULATORY_CARE_PROVIDER_SITE_OTHER): Payer: BC Managed Care – PPO | Admitting: Nurse Practitioner

## 2022-10-18 VITALS — BP 130/78 | HR 99 | Temp 98.3°F | Resp 16 | Ht 63.5 in | Wt 190.6 lb

## 2022-10-18 DIAGNOSIS — Z113 Encounter for screening for infections with a predominantly sexual mode of transmission: Secondary | ICD-10-CM | POA: Diagnosis not present

## 2022-10-18 DIAGNOSIS — Z124 Encounter for screening for malignant neoplasm of cervix: Secondary | ICD-10-CM | POA: Diagnosis not present

## 2022-10-18 DIAGNOSIS — I10 Essential (primary) hypertension: Secondary | ICD-10-CM | POA: Diagnosis not present

## 2022-10-18 DIAGNOSIS — Z6832 Body mass index (BMI) 32.0-32.9, adult: Secondary | ICD-10-CM

## 2022-10-18 DIAGNOSIS — R635 Abnormal weight gain: Secondary | ICD-10-CM | POA: Diagnosis not present

## 2022-10-18 DIAGNOSIS — E669 Obesity, unspecified: Secondary | ICD-10-CM

## 2022-10-18 MED ORDER — DIETHYLPROPION HCL ER 75 MG PO TB24
75.0000 mg | ORAL_TABLET | Freq: Every day | ORAL | 1 refills | Status: DC
Start: 2022-10-18 — End: 2022-12-14

## 2022-10-18 NOTE — Progress Notes (Cosign Needed)
[facility] [address] [place], [place] [address]  Internal MEDICINE  Office Visit Note  Patient Name: Rhonda Sanders  [id number]  [id number]  Date of Service: 10/18/2022  Chief Complaint  Patient presents with   Hypertension   Hyperlipidemia   Follow-up    Weight loss and pap     HPI Rhonda Sanders presents for a follow-up visit for pap and weight loss.  PAP smear -- routine pap done with nuswab.  Weight loss -- no weight loss since her previous office visit but feels like clothes are fitting better and the medication helps decrease her appetite. Wants to continue the medication    Current Medication: Outpatient Encounter Medications as of 10/18/2022  Medication Sig   amLODipine (NORVASC) 5 MG tablet Take 1 tablet (5 mg total) by mouth daily.   cetirizine (ZYRTEC) 10 MG tablet Take 1 tablet by mouth once daily   fluticasone (FLONASE) 50 MCG/ACT nasal spray Place 2 sprays into both nostrils daily.   hydrochlorothiazide (HYDRODIURIL) 25 MG tablet Take 1 tablet by mouth once daily   lisinopril (ZESTRIL) 40 MG tablet Take 1 tablet (40 mg total) by mouth daily.   montelukast (SINGULAIR) 10 MG tablet TAKE 1 TABLET BY MOUTH AT BEDTIME   Multiple Vitamin (MULTIVITAMIN) tablet Take 1 tablet by mouth daily.   rosuvastatin (CRESTOR) 5 MG tablet Take 1 tablet by mouth once daily   venlafaxine (EFFEXOR) 75 MG tablet Take 1 tablet by mouth once daily   [DISCONTINUED] Diethylpropion HCl CR 75 MG TB24 Take 1 tablet (75 mg total) by mouth daily before breakfast.   Diethylpropion HCl CR 75 MG TB24 Take 1 tablet (75 mg total) by mouth daily before breakfast.   No facility-administered encounter medications on file as of 10/18/2022.    Surgical History: Past Surgical History:  Procedure Laterality Date   ABDOMINAL HYSTERECTOMY  04/2006   partial   BREAST BIOPSY Right 2014   CORE W/CLIP - NEG   BREAST BIOPSY Right 2015   CORE W/OUT CLIP - NEG COLUMNAR CELL CHANGE WITH MICRO  CALCIFICATIONS.    BREAST EXCISIONAL BIOPSY Right 2010   EXCISIONAL -ALH, columnar cell hyperplasia   COLONOSCOPY WITH PROPOFOL N/A 05/26/2015   Procedure: COLONOSCOPY WITH PROPOFOL;  Surgeon: Earline Mayotte, MD;  Location: [facility] ENDOSCOPY;  Service: Endoscopy;  Laterality: N/A;    Medical History: Past Medical History:  Diagnosis Date   Actinic keratosis 07/18/2012   R distal med pretibial   Allergy    seasonal    Atypical lobular hyperplasia of right breast 12/25/2008   Sngle focus noted in area of columnar cell hyperplasia   Basal cell carcinoma 06/19/2006   sternum   BCC (basal cell carcinoma of skin) 05/17/2009   L lat chest sup to breast   BCC (basal cell carcinoma of skin) 05/17/2009   R med mid calf   BCC (basal cell carcinoma of skin) 06/02/2013   R sup distal lat pretibial   BCC (basal cell carcinoma of skin) 06/02/2013   R inf distal lat pretibial   BCC (basal cell carcinoma of skin) 12/08/2015   R inf med knee   BCC (basal cell carcinoma of skin) 12/08/2015   R mid to distal lat pretibial   BCC (basal cell carcinoma of skin) 05/03/2016   L lat pretibial below knee   BCC (basal cell carcinoma of skin) 05/03/2016   L prox calf   BCC (basal cell carcinoma of skin) 05/03/2016   R prox calf  prox   BCC (basal cell carcinoma of skin) 05/03/2016   R prox calf distal   Hypercholesterolemia    Hypertension    PONV (postoperative nausea and vomiting)     Family History: Family History  Problem Relation Age of Onset   Breast cancer Mother 61   Heart disease Father        first MI (50s), died age 4 - MI   Heart disease Paternal Grandfather    Heart disease Paternal Uncle    CVA Paternal Grandmother    Breast cancer Maternal Aunt 40   Crohn's disease Brother    Stomach cancer Maternal Grandfather    Colon cancer Neg Hx     Social History   Socioeconomic History   Marital status: Single    Spouse name: Not on file   Number of children: 0   Years of  education: Not on file   Highest education level: Not on file  Occupational History   Not on file  Tobacco Use   Smoking status: Never   Smokeless tobacco: Never  Vaping Use   Vaping status: Never Used  Substance and Sexual Activity   Alcohol use: Yes    Alcohol/week: 0.0 standard drinks of alcohol    Comment: occasionally   Drug use: No   Sexual activity: Not on file  Other Topics Concern   Not on file  Social History Narrative   Not on file   Social Determinants of Health   Financial Resource Strain: Not on file  Food Insecurity: Not on file  Transportation Needs: Not on file  Physical Activity: Not on file  Stress: Not on file  Social Connections: Not on file  Intimate Partner Violence: Not on file      Review of Systems  Constitutional:  Positive for appetite change and unexpected weight change.  Respiratory: Negative.  Negative for cough, chest tightness, shortness of breath and wheezing.   Cardiovascular: Negative.  Negative for chest pain and palpitations.  Genitourinary:  Negative for dysuria, menstrual problem, pelvic pain, vaginal bleeding, vaginal discharge and vaginal pain.    Vital Signs: BP 130/78   Pulse 99   Temp 98.3 F (36.8 C)   Resp 16   Ht 5' 3.5" (1.613 m)   Wt 190 lb 9.6 oz (86.5 kg)   SpO2 96%   BMI 33.23 kg/m    Physical Exam Vitals reviewed.  Constitutional:      General: She is not in acute distress.    Appearance: Normal appearance. She is obese. She is not ill-appearing.  HENT:     Head: Normocephalic and atraumatic.  Eyes:     Pupils: Pupils are equal, round, and reactive to light.  Cardiovascular:     Rate and Rhythm: Normal rate and regular rhythm.  Pulmonary:     Effort: Pulmonary effort is normal. No respiratory distress.  Abdominal:     Hernia: There is no hernia in the left inguinal area or right inguinal area.  Genitourinary:    General: Normal vulva.     Exam position: Lithotomy position.     Pubic Area: No  rash or pubic lice.      Labia:        Right: No rash, tenderness, lesion or injury.        Left: No rash, tenderness, lesion or injury.      Urethra: No prolapse, urethral swelling or urethral lesion.     Vagina: Normal. No signs of injury and foreign body.  No vaginal discharge, erythema, tenderness, bleeding, lesions or prolapsed vaginal walls.     Cervix: Discharge (white) and friability present. No cervical motion tenderness, lesion, erythema, cervical bleeding or eversion.     Uterus: Normal. Not deviated, not enlarged, not fixed, not tender and no uterine prolapse.      Adnexa: Right adnexa normal and left adnexa normal.       Right: No mass, tenderness or fullness.         Left: No mass, tenderness or fullness.       Rectum: No mass, anal fissure or external hemorrhoid.  Lymphadenopathy:     Lower Body: No right inguinal adenopathy. No left inguinal adenopathy.  Neurological:     Mental Status: She is alert and oriented to person, place, and time.  Psychiatric:        Mood and Affect: Mood normal.        Behavior: Behavior normal.        Assessment/Plan: 1. Abnormal weight gain Continue diethylpropion as prescribed, follow up in 8 weeks.  - Diethylpropion HCl CR 75 MG TB24; Take 1 tablet (75 mg total) by mouth daily before breakfast.  Dispense: 30 tablet; Refill: 1  2. Class 1 obesity with body mass index (BMI) of 32.0 to 32.9 in adult, unspecified obesity type, unspecified whether serious comorbidity present Continue diethylpropion as prescribed, follow up in 8 weeks  - Diethylpropion HCl CR 75 MG TB24; Take 1 tablet (75 mg total) by mouth daily before breakfast.  Dispense: 30 tablet; Refill: 1  3. Routine cervical smear Routine pap smear done, normal pelvic exam except white cervical discharge.  - IGP, Aptima HPV  4. Screen for sexually transmitted diseases Cervical swab done  - NuSwab Vaginitis Plus (VG+)   General Counseling: amalya francke understanding of the  findings of todays visit and agrees with plan of treatment. I have discussed any further diagnostic evaluation that may be needed or ordered today. We also reviewed her medications today. she has been encouraged to call the office with any questions or concerns that should arise related to todays visit.    Orders Placed This Encounter  Procedures   NuSwab Vaginitis Plus (VG+)    Meds ordered this encounter  Medications   Diethylpropion HCl CR 75 MG TB24    Sig: Take 1 tablet (75 mg total) by mouth daily before breakfast.    Dispense:  30 tablet    Refill:  1    Fill with good rx coupon, do not run through insurance.    Return in about 8 weeks (around 12/13/2022) for F/U, Weight loss, Natalie Mceuen PCP.   Total time spent:30 Minutes Time spent includes review of chart, medications, test results, and follow up plan with the patient.   [place] Controlled Substance Database was reviewed by me.  This patient was seen by Sallyanne Kuster, FNP-C in collaboration with Dr. Beverely Risen as a part of collaborative care agreement.   Erland Vivas R. Tedd Sias, MSN, FNP-C Internal medicine

## 2022-10-21 ENCOUNTER — Other Ambulatory Visit: Payer: Self-pay | Admitting: Nurse Practitioner

## 2022-10-21 LAB — NUSWAB VAGINITIS PLUS (VG+)
Candida albicans, NAA: NEGATIVE
Candida glabrata, NAA: NEGATIVE
Chlamydia trachomatis, NAA: NEGATIVE
Neisseria gonorrhoeae, NAA: NEGATIVE
Trich vag by NAA: POSITIVE — AB

## 2022-10-21 MED ORDER — METRONIDAZOLE 500 MG PO TABS: 500.0000 mg | ORAL_TABLET | Freq: Two times a day (BID) | ORAL | 0 refills | Status: AC

## 2022-10-21 NOTE — Progress Notes (Signed)
Nuswab is positive for trichomonas. I have sent metronidazole 500 mg twice daily x7 days to her pharmacy. The swab was negative for chlamydia, gonnorrhea, yeast and bacterial vaginosis Pap smear is still pending.

## 2022-10-23 ENCOUNTER — Telehealth: Payer: Self-pay

## 2022-10-23 NOTE — Telephone Encounter (Signed)
Pt notified for labs  

## 2022-10-23 NOTE — Telephone Encounter (Signed)
-----   Message from Sallyanne Kuster sent at 10/21/2022  7:33 AM EDT ----- Alwyn Ren is positive for trichomonas. I have sent metronidazole 500 mg twice daily x7 days to her pharmacy. The swab was negative for chlamydia, gonnorrhea, yeast and bacterial vaginosis Pap smear is still pending.

## 2022-10-23 NOTE — Telephone Encounter (Signed)
Left message for patient to give office a call.

## 2022-10-24 LAB — IGP, APTIMA HPV: HPV Aptima: POSITIVE — AB

## 2022-11-06 ENCOUNTER — Encounter: Payer: Self-pay | Admitting: Nurse Practitioner

## 2022-11-07 ENCOUNTER — Other Ambulatory Visit: Payer: Self-pay | Admitting: Nurse Practitioner

## 2022-11-07 DIAGNOSIS — J309 Allergic rhinitis, unspecified: Secondary | ICD-10-CM

## 2022-11-24 DIAGNOSIS — R748 Abnormal levels of other serum enzymes: Secondary | ICD-10-CM | POA: Diagnosis not present

## 2022-11-24 DIAGNOSIS — E782 Mixed hyperlipidemia: Secondary | ICD-10-CM | POA: Diagnosis not present

## 2022-11-24 DIAGNOSIS — E559 Vitamin D deficiency, unspecified: Secondary | ICD-10-CM | POA: Diagnosis not present

## 2022-11-24 DIAGNOSIS — R7303 Prediabetes: Secondary | ICD-10-CM | POA: Diagnosis not present

## 2022-11-25 LAB — CBC WITH DIFFERENTIAL/PLATELET
Basophils Absolute: 0.1 10*3/uL (ref 0.0–0.2)
Basos: 1 %
EOS (ABSOLUTE): 0.1 10*3/uL (ref 0.0–0.4)
Eos: 1 %
Hematocrit: 42.9 % (ref 34.0–46.6)
Hemoglobin: 14.1 g/dL (ref 11.1–15.9)
Immature Grans (Abs): 0 10*3/uL (ref 0.0–0.1)
Immature Granulocytes: 0 %
Lymphocytes Absolute: 2.9 10*3/uL (ref 0.7–3.1)
Lymphs: 35 %
MCH: 30.1 pg (ref 26.6–33.0)
MCHC: 32.9 g/dL (ref 31.5–35.7)
MCV: 92 fL (ref 79–97)
Monocytes Absolute: 0.6 10*3/uL (ref 0.1–0.9)
Monocytes: 7 %
Neutrophils Absolute: 4.6 10*3/uL (ref 1.4–7.0)
Neutrophils: 56 %
Platelets: 460 10*3/uL — ABNORMAL HIGH (ref 150–450)
RBC: 4.69 x10E6/uL (ref 3.77–5.28)
RDW: 12.6 % (ref 11.7–15.4)
WBC: 8.3 10*3/uL (ref 3.4–10.8)

## 2022-11-25 LAB — LIPID PANEL
Chol/HDL Ratio: 4.5 ratio — ABNORMAL HIGH (ref 0.0–4.4)
Cholesterol, Total: 193 mg/dL (ref 100–199)
HDL: 43 mg/dL (ref >39)
LDL Chol Calc (NIH): 113 mg/dL — ABNORMAL HIGH (ref 0–99)
Triglycerides: 214 mg/dL — ABNORMAL HIGH (ref 0–149)
VLDL Cholesterol Cal: 37 mg/dL (ref 5–40)

## 2022-11-25 LAB — CMP14+EGFR
ALT: 43 IU/L — ABNORMAL HIGH (ref 0–32)
AST: 36 IU/L (ref 0–40)
Albumin: 4.5 g/dL (ref 3.8–4.9)
Alkaline Phosphatase: 107 IU/L (ref 44–121)
BUN/Creatinine Ratio: 16 (ref 12–28)
BUN: 18 mg/dL (ref 8–27)
Bilirubin Total: 0.5 mg/dL (ref 0.0–1.2)
CO2: 23 mmol/L (ref 20–29)
Calcium: 10.1 mg/dL (ref 8.7–10.3)
Chloride: 103 mmol/L (ref 96–106)
Creatinine, Ser: 1.14 mg/dL — ABNORMAL HIGH (ref 0.57–1.00)
Globulin, Total: 2.8 g/dL (ref 1.5–4.5)
Glucose: 130 mg/dL — ABNORMAL HIGH (ref 70–99)
Potassium: 4.1 mmol/L (ref 3.5–5.2)
Sodium: 143 mmol/L (ref 134–144)
Total Protein: 7.3 g/dL (ref 6.0–8.5)
eGFR: 55 mL/min/{1.73_m2} — ABNORMAL LOW (ref >59)

## 2022-11-25 LAB — VITAMIN D 25 HYDROXY (VIT D DEFICIENCY, FRACTURES): Vit D, 25-Hydroxy: 32.8 ng/mL (ref 30.0–100.0)

## 2022-11-25 LAB — HGB A1C W/O EAG: Hgb A1c MFr Bld: 6.4 % — ABNORMAL HIGH (ref 4.8–5.6)

## 2022-12-02 ENCOUNTER — Other Ambulatory Visit: Payer: Self-pay | Admitting: Nurse Practitioner

## 2022-12-02 DIAGNOSIS — E782 Mixed hyperlipidemia: Secondary | ICD-10-CM

## 2022-12-07 ENCOUNTER — Other Ambulatory Visit: Payer: Self-pay | Admitting: Nurse Practitioner

## 2022-12-07 DIAGNOSIS — J309 Allergic rhinitis, unspecified: Secondary | ICD-10-CM

## 2022-12-14 ENCOUNTER — Ambulatory Visit (INDEPENDENT_AMBULATORY_CARE_PROVIDER_SITE_OTHER): Payer: BC Managed Care – PPO | Admitting: Nurse Practitioner

## 2022-12-14 ENCOUNTER — Encounter: Payer: Self-pay | Admitting: Nurse Practitioner

## 2022-12-14 VITALS — BP 136/78 | HR 100 | Temp 98.3°F | Resp 16 | Ht 63.5 in | Wt 193.2 lb

## 2022-12-14 DIAGNOSIS — I152 Hypertension secondary to endocrine disorders: Secondary | ICD-10-CM | POA: Diagnosis not present

## 2022-12-14 DIAGNOSIS — E669 Obesity, unspecified: Secondary | ICD-10-CM

## 2022-12-14 DIAGNOSIS — E1159 Type 2 diabetes mellitus with other circulatory complications: Secondary | ICD-10-CM | POA: Diagnosis not present

## 2022-12-14 DIAGNOSIS — E1169 Type 2 diabetes mellitus with other specified complication: Secondary | ICD-10-CM

## 2022-12-14 DIAGNOSIS — E1165 Type 2 diabetes mellitus with hyperglycemia: Secondary | ICD-10-CM

## 2022-12-14 DIAGNOSIS — E785 Hyperlipidemia, unspecified: Secondary | ICD-10-CM | POA: Diagnosis not present

## 2022-12-14 DIAGNOSIS — E6609 Other obesity due to excess calories: Secondary | ICD-10-CM

## 2022-12-14 MED ORDER — TIRZEPATIDE 2.5 MG/0.5ML ~~LOC~~ SOAJ
2.5000 mg | SUBCUTANEOUS | 0 refills | Status: DC
Start: 2022-12-14 — End: 2023-01-29

## 2022-12-14 MED ORDER — TIRZEPATIDE 5 MG/0.5ML ~~LOC~~ SOAJ
5.0000 mg | SUBCUTANEOUS | 1 refills | Status: DC
Start: 2022-12-14 — End: 2023-02-08

## 2022-12-14 NOTE — Progress Notes (Signed)
-+[facility] [address] [place], [place] [address]  Internal MEDICINE  Office Visit Note  Patient Name: Rhonda Sanders  [id number]  [id number]  Date of Service: 12/14/2022  Chief Complaint  Patient presents with   Follow-up    Weight loss     HPI Rhonda Sanders presents for a follow-up visit for lab results and weight loss.  Diagnosed today with type 2 diabetes based on fasting plasma glucose of 130. She has also experienced overt symptoms of hyperglycemia including  fatigue, irritability, polyphagia, polydipsia, polyuria, dry skin, and nocturia.  --risk factors contributing to type 2 diabetes diagnosis include hypertension, obesity with BMI >33, high cholesterol levels, and postmenopausal state. Has had long-standing prediabetes prior to new diagnosis of type 2 diabetes.  Has previously tried metformin to improve prediabetes but did not tolerate adverse gastrointestinal side effects. Also A1c is 6.4 currently.  Also had a prior fasting plasma glucose of 128 in 2019.  Gained 3 lbs, diethelpropion not helping  Decrease GFR to 55, will repeat labs in a few months.    Current Medication: Outpatient Encounter Medications as of 12/14/2022  Medication Sig   amLODipine (NORVASC) 5 MG tablet Take 1 tablet (5 mg total) by mouth daily.   cetirizine (ZYRTEC) 10 MG tablet Take 1 tablet by mouth once daily   fluticasone (FLONASE) 50 MCG/ACT nasal spray Place 2 sprays into both nostrils daily.   hydrochlorothiazide (HYDRODIURIL) 25 MG tablet Take 1 tablet by mouth once daily   lisinopril (ZESTRIL) 40 MG tablet Take 1 tablet (40 mg total) by mouth daily.   montelukast (SINGULAIR) 10 MG tablet TAKE 1 TABLET BY MOUTH AT BEDTIME   Multiple Vitamin (MULTIVITAMIN) tablet Take 1 tablet by mouth daily.   rosuvastatin (CRESTOR) 5 MG tablet Take 1 tablet by mouth once daily   tirzepatide (MOUNJARO) 2.5 MG/0.5ML Pen Inject 2.5 mg into the skin once a week for 4 doses.   tirzepatide [facility]) 5  MG/0.5ML Pen Inject 5 mg into the skin once a week.   venlafaxine (EFFEXOR) 75 MG tablet Take 1 tablet by mouth once daily   [DISCONTINUED] Diethylpropion HCl CR 75 MG TB24 Take 1 tablet (75 mg total) by mouth daily before breakfast.   No facility-administered encounter medications on file as of 12/14/2022.    Surgical History: Past Surgical History:  Procedure Laterality Date   ABDOMINAL HYSTERECTOMY  04/2006   partial   BREAST BIOPSY Right 2014   CORE W/CLIP - NEG   BREAST BIOPSY Right 2015   CORE W/OUT CLIP - NEG COLUMNAR CELL CHANGE WITH MICRO CALCIFICATIONS.    BREAST EXCISIONAL BIOPSY Right 2010   EXCISIONAL -ALH, columnar cell hyperplasia   COLONOSCOPY WITH PROPOFOL N/A 05/26/2015   Procedure: COLONOSCOPY WITH PROPOFOL;  Surgeon: Earline Mayotte, MD;  Location: [facility] ENDOSCOPY;  Service: Endoscopy;  Laterality: N/A;    Medical History: Past Medical History:  Diagnosis Date   Actinic keratosis 07/18/2012   R distal med pretibial   Allergy    seasonal    Atypical lobular hyperplasia of right breast 12/25/2008   Sngle focus noted in area of columnar cell hyperplasia   Basal cell carcinoma 06/19/2006   sternum   BCC (basal cell carcinoma of skin) 05/17/2009   L lat chest sup to breast   BCC (basal cell carcinoma of skin) 05/17/2009   R med mid calf   BCC (basal cell carcinoma of skin) 06/02/2013   R sup distal lat pretibial   BCC (basal cell  carcinoma of skin) 06/02/2013   R inf distal lat pretibial   BCC (basal cell carcinoma of skin) 12/08/2015   R inf med knee   BCC (basal cell carcinoma of skin) 12/08/2015   R mid to distal lat pretibial   BCC (basal cell carcinoma of skin) 05/03/2016   L lat pretibial below knee   BCC (basal cell carcinoma of skin) 05/03/2016   L prox calf   BCC (basal cell carcinoma of skin) 05/03/2016   R prox calf prox   BCC (basal cell carcinoma of skin) 05/03/2016   R prox calf distal   Hypercholesterolemia    Hypertension    PONV  (postoperative nausea and vomiting)     Family History: Family History  Problem Relation Age of Onset   Breast cancer Mother 63   Heart disease Father        first MI (104s), died age 33 - MI   Heart disease Paternal Grandfather    Heart disease Paternal Uncle    CVA Paternal Grandmother    Breast cancer Maternal Aunt 40   Crohn's disease Brother    Stomach cancer Maternal Grandfather    Colon cancer Neg Hx     Social History   Socioeconomic History   Marital status: Single    Spouse name: Not on file   Number of children: 0   Years of education: Not on file   Highest education level: Not on file  Occupational History   Not on file  Tobacco Use   Smoking status: Never   Smokeless tobacco: Never  Vaping Use   Vaping status: Never Used  Substance and Sexual Activity   Alcohol use: Yes    Alcohol/week: 0.0 standard drinks of alcohol    Comment: occasionally   Drug use: No   Sexual activity: Not on file  Other Topics Concern   Not on file  Social History Narrative   Not on file   Social Determinants of Health   Financial Resource Strain: Not on file  Food Insecurity: Not on file  Transportation Needs: Not on file  Physical Activity: Not on file  Stress: Not on file  Social Connections: Not on file  Intimate Partner Violence: Not on file      Review of Systems  Constitutional:  Positive for appetite change, fatigue and unexpected weight change.  Respiratory: Negative.  Negative for cough, chest tightness, shortness of breath and wheezing.   Cardiovascular: Negative.  Negative for chest pain and palpitations.  Endocrine: Positive for polydipsia, polyphagia and polyuria (including nocturia).  Genitourinary:  Negative for dysuria, menstrual problem, pelvic pain, vaginal bleeding, vaginal discharge and vaginal pain.  Psychiatric/Behavioral:  Positive for agitation (irritability).     Vital Signs: BP 136/78   Pulse 100   Temp 98.3 F (36.8 C)   Resp 16    Ht 5' 3.5" (1.613 m)   Wt 193 lb 3.2 oz (87.6 kg)   SpO2 96%   BMI 33.69 kg/m    Physical Exam Vitals reviewed.  Constitutional:      General: She is not in acute distress.    Appearance: Normal appearance. She is obese. She is not ill-appearing.  HENT:     Head: Normocephalic and atraumatic.  Eyes:     Pupils: Pupils are equal, round, and reactive to light.  Cardiovascular:     Rate and Rhythm: Normal rate and regular rhythm.  Pulmonary:     Effort: Pulmonary effort is normal. No respiratory distress.  Neurological:     Mental Status: She is alert and oriented to person, place, and time.  Psychiatric:        Mood and Affect: Mood normal.        Behavior: Behavior normal.        Assessment/Plan: 1. Type 2 diabetes mellitus with obesity (HCC) associated with hypertension and hyperlipidemia. Patient has already tried metformin and did not tolerate it due to adverse side effects. Will try mounjaro starting with 2.5 mg weekly x4 doses then increasing to 5 mg weekly. Follow up in 1 month. Additional information about diabetic diet and type 2 diabetes provided to the patient.  - tirzepatide [facility]) 2.5 MG/0.5ML Pen; Inject 2.5 mg into the skin once a week for 4 doses.  Dispense: 2 mL; Refill: 0 - tirzepatide (MOUNJARO) 5 MG/0.5ML Pen; Inject 5 mg into the skin once a week.  Dispense: 6 mL; Refill: 1  2. Hypertension associated with diabetes (HCC) and also associated with hyperlipidemia  Stable, continue amlodipine and lisinopril as prescribed.   3. Hyperlipidemia associated with type 2 diabetes mellitus (HCC) and also associated with hypertension Continue rosuvastatin as prescribed. Control of her diabetes will improve her cholesterol levels as well as weight loss that the mounjaro will help with.   4. Class 1 obesity due to excess calories with serious comorbidity and body mass index (BMI) of 33.0 to 33.9 in adult associated with type 2 diabetes mellitus, hyperlipidemia, and  hypertension.  New prescription for mounjaro will aid in weight loss. Patient also encouraged to continue diet and lifestyle modifications including physical activity as tolerated. Follow up in 1 month.    General Counseling: jerine surles understanding of the findings of todays visit and agrees with plan of treatment. I have discussed any further diagnostic evaluation that may be needed or ordered today. We also reviewed her medications today. she has been encouraged to call the office with any questions or concerns that should arise related to todays visit.    No orders of the defined types were placed in this encounter.   Meds ordered this encounter  Medications   tirzepatide (MOUNJARO) 2.5 MG/0.5ML Pen    Sig: Inject 2.5 mg into the skin once a week for 4 doses.    Dispense:  2 mL    Refill:  0    Dx code E11.65; please send prior auth asap if required; patient to take 2.5 mg injection for 4 weeks then will increase to 5 mg dose.   tirzepatide [facility]) 5 MG/0.5ML Pen    Sig: Inject 5 mg into the skin once a week.    Dispense:  6 mL    Refill:  1    Dx code E11.65; patient will start 5 mg dose after 4 weeks of the 2.5 mg dose    Return in about 1 month (around 01/14/2023) for F/U, Eilan Mcinerny PCP, eval new med.   Total time spent:30 Minutes Time spent includes review of chart, medications, test results, and follow up plan with the patient.   [place] Controlled Substance Database was reviewed by me.  This patient was seen by Sallyanne Kuster, FNP-C in collaboration with Dr. Beverely Risen as a part of collaborative care agreement.   Neesa Knapik R. Tedd Sias, MSN, FNP-C Internal medicine

## 2022-12-17 ENCOUNTER — Encounter: Payer: Self-pay | Admitting: Nurse Practitioner

## 2022-12-18 ENCOUNTER — Encounter: Payer: Self-pay | Admitting: Nurse Practitioner

## 2022-12-21 ENCOUNTER — Telehealth: Payer: Self-pay

## 2022-12-21 NOTE — Telephone Encounter (Signed)
PA was done and approved for [facility] patient notified.

## 2022-12-22 ENCOUNTER — Other Ambulatory Visit: Payer: Self-pay | Admitting: Nurse Practitioner

## 2022-12-22 DIAGNOSIS — N951 Menopausal and female climacteric states: Secondary | ICD-10-CM

## 2022-12-25 ENCOUNTER — Telehealth: Payer: Self-pay

## 2022-12-25 NOTE — Telephone Encounter (Signed)
Pt called that [facility] expensive advised her to try coupon

## 2023-01-15 ENCOUNTER — Encounter: Payer: Self-pay | Admitting: Nurse Practitioner

## 2023-01-15 ENCOUNTER — Ambulatory Visit (INDEPENDENT_AMBULATORY_CARE_PROVIDER_SITE_OTHER): Payer: BC Managed Care – PPO | Admitting: Nurse Practitioner

## 2023-01-15 VITALS — BP 124/80 | HR 107 | Temp 98.4°F | Resp 16 | Ht 63.5 in | Wt 189.8 lb

## 2023-01-15 DIAGNOSIS — E1159 Type 2 diabetes mellitus with other circulatory complications: Secondary | ICD-10-CM | POA: Diagnosis not present

## 2023-01-15 DIAGNOSIS — E1169 Type 2 diabetes mellitus with other specified complication: Secondary | ICD-10-CM | POA: Diagnosis not present

## 2023-01-15 DIAGNOSIS — E66811 Obesity, class 1: Secondary | ICD-10-CM

## 2023-01-15 DIAGNOSIS — E669 Obesity, unspecified: Secondary | ICD-10-CM | POA: Diagnosis not present

## 2023-01-15 DIAGNOSIS — Z6833 Body mass index (BMI) 33.0-33.9, adult: Secondary | ICD-10-CM

## 2023-01-15 DIAGNOSIS — I152 Hypertension secondary to endocrine disorders: Secondary | ICD-10-CM

## 2023-01-15 DIAGNOSIS — E785 Hyperlipidemia, unspecified: Secondary | ICD-10-CM

## 2023-01-15 DIAGNOSIS — E6609 Other obesity due to excess calories: Secondary | ICD-10-CM

## 2023-01-15 NOTE — Progress Notes (Signed)
[facility] [address] [place], [place] [address]  Internal MEDICINE  Office Visit Note  Patient Name: Rhonda Sanders  [id number]  [id number]  Date of Service: 01/15/2023  Chief Complaint  Patient presents with   Hyperlipidemia   Hypertension   Follow-up    HPI Rhonda Sanders presents for a follow-up visit for diabetes, hypertension, high cholesterol and weight loss.  Diabetes -- diagnosed with type 2 diabetes based on an elevated fasting glucose of 130. Was started on mounjaro. She has lost about 4-5 lbs so far.  Hypertension -- currently taking amlodipine, lisinopril and hydrochlorothiazide  Hyperlipidemia -- taking rosuvastatin.  Weight loss -- lost about 4-5 lbs so far, on mounjaro for diabetes which will also help with weight loss.     Current Medication: Outpatient Encounter Medications as of 01/15/2023  Medication Sig   amLODipine (NORVASC) 5 MG tablet Take 1 tablet (5 mg total) by mouth daily.   cetirizine (ZYRTEC) 10 MG tablet Take 1 tablet by mouth once daily   fluticasone (FLONASE) 50 MCG/ACT nasal spray Place 2 sprays into both nostrils daily.   hydrochlorothiazide (HYDRODIURIL) 25 MG tablet Take 1 tablet by mouth once daily   lisinopril (ZESTRIL) 40 MG tablet Take 1 tablet (40 mg total) by mouth daily.   montelukast (SINGULAIR) 10 MG tablet TAKE 1 TABLET BY MOUTH AT BEDTIME   Multiple Vitamin (MULTIVITAMIN) tablet Take 1 tablet by mouth daily.   rosuvastatin (CRESTOR) 5 MG tablet Take 1 tablet by mouth once daily   tirzepatide (MOUNJARO) 5 MG/0.5ML Pen Inject 5 mg into the skin once a week.   venlafaxine (EFFEXOR) 75 MG tablet TAKE 1 TABLET BY MOUTH EVERY DAY   No facility-administered encounter medications on file as of 01/15/2023.    Surgical History: Past Surgical History:  Procedure Laterality Date   ABDOMINAL HYSTERECTOMY  04/2006   partial   BREAST BIOPSY Right 2014   CORE W/CLIP - NEG   BREAST BIOPSY Right 2015   CORE W/OUT CLIP - NEG  COLUMNAR CELL CHANGE WITH MICRO CALCIFICATIONS.    BREAST EXCISIONAL BIOPSY Right 2010   EXCISIONAL -ALH, columnar cell hyperplasia   COLONOSCOPY WITH PROPOFOL N/A 05/26/2015   Procedure: COLONOSCOPY WITH PROPOFOL;  Surgeon: Earline Mayotte, MD;  Location: [facility] ENDOSCOPY;  Service: Endoscopy;  Laterality: N/A;    Medical History: Past Medical History:  Diagnosis Date   Actinic keratosis 07/18/2012   R distal med pretibial   Allergy    seasonal    Atypical lobular hyperplasia of right breast 12/25/2008   Sngle focus noted in area of columnar cell hyperplasia   Basal cell carcinoma 06/19/2006   sternum   BCC (basal cell carcinoma of skin) 05/17/2009   L lat chest sup to breast   BCC (basal cell carcinoma of skin) 05/17/2009   R med mid calf   BCC (basal cell carcinoma of skin) 06/02/2013   R sup distal lat pretibial   BCC (basal cell carcinoma of skin) 06/02/2013   R inf distal lat pretibial   BCC (basal cell carcinoma of skin) 12/08/2015   R inf med knee   BCC (basal cell carcinoma of skin) 12/08/2015   R mid to distal lat pretibial   BCC (basal cell carcinoma of skin) 05/03/2016   L lat pretibial below knee   BCC (basal cell carcinoma of skin) 05/03/2016   L prox calf   BCC (basal cell carcinoma of skin) 05/03/2016   R prox calf prox   BCC (  basal cell carcinoma of skin) 05/03/2016   R prox calf distal   Hypercholesterolemia    Hypertension    PONV (postoperative nausea and vomiting)     Family History: Family History  Problem Relation Age of Onset   Breast cancer Mother 73   Heart disease Father        first MI (67s), died age 30 - MI   Heart disease Paternal Grandfather    Heart disease Paternal Uncle    CVA Paternal Grandmother    Breast cancer Maternal Aunt 40   Crohn's disease Brother    Stomach cancer Maternal Grandfather    Colon cancer Neg Hx     Social History   Socioeconomic History   Marital status: Single    Spouse name: Not on file   Number  of children: 0   Years of education: Not on file   Highest education level: Not on file  Occupational History   Not on file  Tobacco Use   Smoking status: Never   Smokeless tobacco: Never  Vaping Use   Vaping status: Never Used  Substance and Sexual Activity   Alcohol use: Yes    Alcohol/week: 0.0 standard drinks of alcohol    Comment: occasionally   Drug use: No   Sexual activity: Not on file  Other Topics Concern   Not on file  Social History Narrative   Not on file   Social Determinants of Health   Financial Resource Strain: Not on file  Food Insecurity: Not on file  Transportation Needs: Not on file  Physical Activity: Not on file  Stress: Not on file  Social Connections: Not on file  Intimate Partner Violence: Not on file      Review of Systems  Constitutional:  Positive for appetite change, fatigue and unexpected weight change.  Respiratory: Negative.  Negative for cough, chest tightness, shortness of breath and wheezing.   Cardiovascular: Negative.  Negative for chest pain and palpitations.  Endocrine: Positive for polydipsia, polyphagia and polyuria (including nocturia).  Genitourinary:  Negative for dysuria, menstrual problem, pelvic pain, vaginal bleeding, vaginal discharge and vaginal pain.  Psychiatric/Behavioral:  Positive for agitation (irritability).     Vital Signs: BP 124/80   Pulse (!) 107   Temp 98.4 F (36.9 C)   Resp 16   Ht 5' 3.5" (1.613 m)   Wt 189 lb 12.8 oz (86.1 kg)   SpO2 95%   BMI 33.09 kg/m    Physical Exam Vitals reviewed.  Constitutional:      General: She is not in acute distress.    Appearance: Normal appearance. She is obese. She is not ill-appearing.  HENT:     Head: Normocephalic and atraumatic.  Eyes:     Pupils: Pupils are equal, round, and reactive to light.  Cardiovascular:     Rate and Rhythm: Normal rate and regular rhythm.  Pulmonary:     Effort: Pulmonary effort is normal. No respiratory distress.   Neurological:     Mental Status: She is alert and oriented to person, place, and time.  Psychiatric:        Mood and Affect: Mood normal.        Behavior: Behavior normal.        Assessment/Plan: 1. Type 2 diabetes mellitus with obesity (HCC) (Primary) Continue mounjaro as prescribed.   2. Hypertension associated with diabetes (HCC) Stable, Continue amlodipine, lisinopril and hydrochlorothiazide as prescribed   3. Hyperlipidemia associated with type 2 diabetes mellitus (HCC) Continue  rosuvastatin as prescribed.   4. Class 1 obesity due to excess calories with serious comorbidity and body mass index (BMI) of 33.0 to 33.9 in adult Continue mounjaro as prescribed.    General Counseling: bristen batcheller understanding of the findings of todays visit and agrees with plan of treatment. I have discussed any further diagnostic evaluation that may be needed or ordered today. We also reviewed her medications today. she has been encouraged to call the office with any questions or concerns that should arise related to todays visit.    No orders of the defined types were placed in this encounter.   No orders of the defined types were placed in this encounter.   Return in about 2 months (around 03/17/2023) for F/U, Weight loss, Rhonda Sanders PCP.   Total time spent:30 Minutes Time spent includes review of chart, medications, test results, and follow up plan with the patient.   [place] Controlled Substance Database was reviewed by me.  This patient was seen by Sallyanne Kuster, FNP-C in collaboration with Dr. Beverely Risen as a part of collaborative care agreement.   Davi Rotan R. Tedd Sias, MSN, FNP-C Internal medicine

## 2023-01-26 ENCOUNTER — Other Ambulatory Visit: Payer: Self-pay | Admitting: Nurse Practitioner

## 2023-01-26 DIAGNOSIS — E1169 Type 2 diabetes mellitus with other specified complication: Secondary | ICD-10-CM

## 2023-01-27 NOTE — Telephone Encounter (Signed)
Is she on this?? Duplicate therapy comes when I am trying to refill, please confirm with pharmacy or pt

## 2023-02-05 ENCOUNTER — Other Ambulatory Visit: Payer: Self-pay | Admitting: Nurse Practitioner

## 2023-02-05 DIAGNOSIS — I10 Essential (primary) hypertension: Secondary | ICD-10-CM

## 2023-02-06 ENCOUNTER — Other Ambulatory Visit: Payer: Self-pay | Admitting: Nurse Practitioner

## 2023-02-06 DIAGNOSIS — J309 Allergic rhinitis, unspecified: Secondary | ICD-10-CM

## 2023-02-08 ENCOUNTER — Other Ambulatory Visit: Payer: Self-pay | Admitting: Nurse Practitioner

## 2023-02-08 DIAGNOSIS — E1169 Type 2 diabetes mellitus with other specified complication: Secondary | ICD-10-CM

## 2023-02-08 MED ORDER — TIRZEPATIDE 5 MG/0.5ML ~~LOC~~ SOAJ
5.0000 mg | SUBCUTANEOUS | 1 refills | Status: DC
Start: 2023-02-08 — End: 2023-09-04

## 2023-03-20 ENCOUNTER — Encounter: Payer: Self-pay | Admitting: Nurse Practitioner

## 2023-03-20 ENCOUNTER — Ambulatory Visit (INDEPENDENT_AMBULATORY_CARE_PROVIDER_SITE_OTHER): Payer: BC Managed Care – PPO | Admitting: Nurse Practitioner

## 2023-03-20 VITALS — BP 135/75 | HR 89 | Temp 98.0°F | Resp 16 | Ht 63.5 in | Wt 188.2 lb

## 2023-03-20 DIAGNOSIS — I152 Hypertension secondary to endocrine disorders: Secondary | ICD-10-CM

## 2023-03-20 DIAGNOSIS — I10 Essential (primary) hypertension: Secondary | ICD-10-CM

## 2023-03-20 DIAGNOSIS — E669 Obesity, unspecified: Secondary | ICD-10-CM

## 2023-03-20 DIAGNOSIS — J309 Allergic rhinitis, unspecified: Secondary | ICD-10-CM

## 2023-03-20 DIAGNOSIS — E1159 Type 2 diabetes mellitus with other circulatory complications: Secondary | ICD-10-CM

## 2023-03-20 DIAGNOSIS — N951 Menopausal and female climacteric states: Secondary | ICD-10-CM | POA: Diagnosis not present

## 2023-03-20 DIAGNOSIS — E1169 Type 2 diabetes mellitus with other specified complication: Secondary | ICD-10-CM

## 2023-03-20 DIAGNOSIS — E782 Mixed hyperlipidemia: Secondary | ICD-10-CM

## 2023-03-20 DIAGNOSIS — E785 Hyperlipidemia, unspecified: Secondary | ICD-10-CM

## 2023-03-20 MED ORDER — CETIRIZINE HCL 10 MG PO TABS: 10.0000 mg | ORAL_TABLET | Freq: Every day | ORAL | 3 refills | Status: DC

## 2023-03-20 MED ORDER — AMLODIPINE BESYLATE 5 MG PO TABS: 5.0000 mg | ORAL_TABLET | Freq: Every day | ORAL | 3 refills | Status: AC

## 2023-03-20 MED ORDER — ROSUVASTATIN CALCIUM 5 MG PO TABS: 5.0000 mg | ORAL_TABLET | Freq: Every day | ORAL | 3 refills | Status: DC

## 2023-03-20 MED ORDER — MONTELUKAST SODIUM 10 MG PO TABS: 10.0000 mg | ORAL_TABLET | Freq: Every day | ORAL | 3 refills | Status: DC

## 2023-03-20 MED ORDER — HYDROCHLOROTHIAZIDE 25 MG PO TABS: 25.0000 mg | ORAL_TABLET | Freq: Every day | ORAL | 3 refills | Status: AC

## 2023-03-20 MED ORDER — VENLAFAXINE HCL 75 MG PO TABS: 75.0000 mg | ORAL_TABLET | Freq: Every day | ORAL | 3 refills | Status: AC

## 2023-03-20 NOTE — Progress Notes (Signed)
 [facility] [address] [place], [place] [address]  Internal MEDICINE  Office Visit Note  Patient Name: Rhonda Sanders  [id number]  [id number]  Date of Service: 03/20/2023  Chief Complaint  Patient presents with   Hyperlipidemia   Hypertension   Follow-up    HPI Rhonda Sanders presents for a follow-up visit for diabetes, hypertension and weight loss.  Weight loss -- on mounjaro  now, the 5 mg dose, tolerating well, no signficant side effects Hypertension -- elevated blood pressure, rechecked and improved. Take amlodipine  and hydrochlorothiazide   Diabetes -- taking mounjaro  5 mg weekly, doing well, no issues.     Current Medication: Outpatient Encounter Medications as of 03/20/2023  Medication Sig   fluticasone  (FLONASE ) 50 MCG/ACT nasal spray Place 2 sprays into both nostrils daily.   lisinopril  (ZESTRIL ) 40 MG tablet TAKE 1 TABLET BY MOUTH EVERY DAY   Multiple Vitamin (MULTIVITAMIN) tablet Take 1 tablet by mouth daily.   tirzepatide  (MOUNJARO ) 5 MG/0.5ML Pen Inject 5 mg into the skin once a week.   [DISCONTINUED] amLODipine  (NORVASC ) 5 MG tablet Take 1 tablet (5 mg total) by mouth daily.   [DISCONTINUED] cetirizine  (ZYRTEC ) 10 MG tablet Take 1 tablet by mouth once daily   [DISCONTINUED] hydrochlorothiazide  (HYDRODIURIL ) 25 MG tablet Take 1 tablet by mouth once daily   [DISCONTINUED] montelukast  (SINGULAIR ) 10 MG tablet TAKE 1 TABLET BY MOUTH AT BEDTIME   [DISCONTINUED] rosuvastatin  (CRESTOR ) 5 MG tablet Take 1 tablet by mouth once daily   [DISCONTINUED] venlafaxine  (EFFEXOR ) 75 MG tablet TAKE 1 TABLET BY MOUTH EVERY DAY   amLODipine  (NORVASC ) 5 MG tablet Take 1 tablet (5 mg total) by mouth daily.   cetirizine  (ZYRTEC ) 10 MG tablet Take 1 tablet (10 mg total) by mouth daily.   hydrochlorothiazide  (HYDRODIURIL ) 25 MG tablet Take 1 tablet (25 mg total) by mouth daily.   montelukast  (SINGULAIR ) 10 MG tablet Take 1 tablet (10 mg total) by mouth at bedtime.   rosuvastatin   (CRESTOR ) 5 MG tablet Take 1 tablet (5 mg total) by mouth daily.   venlafaxine  (EFFEXOR ) 75 MG tablet Take 1 tablet (75 mg total) by mouth daily.   No facility-administered encounter medications on file as of 03/20/2023.    Surgical History: Past Surgical History:  Procedure Laterality Date   ABDOMINAL HYSTERECTOMY  04/2006   partial   BREAST BIOPSY Right 2014   CORE W/CLIP - NEG   BREAST BIOPSY Right 2015   CORE W/OUT CLIP - NEG COLUMNAR CELL CHANGE WITH MICRO CALCIFICATIONS.    BREAST EXCISIONAL BIOPSY Right 2010   EXCISIONAL -ALH, columnar cell hyperplasia   COLONOSCOPY WITH PROPOFOL  N/A 05/26/2015   Procedure: COLONOSCOPY WITH PROPOFOL ;  Surgeon: Reyes LELON Cota, MD;  Location: [facility] ENDOSCOPY;  Service: Endoscopy;  Laterality: N/A;    Medical History: Past Medical History:  Diagnosis Date   Actinic keratosis 07/18/2012   R distal med pretibial   Allergy    seasonal    Atypical lobular hyperplasia of right breast 12/25/2008   Sngle focus noted in area of columnar cell hyperplasia   Basal cell carcinoma 06/19/2006   sternum   BCC (basal cell carcinoma of skin) 05/17/2009   L lat chest sup to breast   BCC (basal cell carcinoma of skin) 05/17/2009   R med mid calf   BCC (basal cell carcinoma of skin) 06/02/2013   R sup distal lat pretibial   BCC (basal cell carcinoma of skin) 06/02/2013   R inf distal lat pretibial   BCC (  basal cell carcinoma of skin) 12/08/2015   R inf med knee   BCC (basal cell carcinoma of skin) 12/08/2015   R mid to distal lat pretibial   BCC (basal cell carcinoma of skin) 05/03/2016   L lat pretibial below knee   BCC (basal cell carcinoma of skin) 05/03/2016   L prox calf   BCC (basal cell carcinoma of skin) 05/03/2016   R prox calf prox   BCC (basal cell carcinoma of skin) 05/03/2016   R prox calf distal   Hypercholesterolemia    Hypertension    PONV (postoperative nausea and vomiting)     Family History: Family History  Problem  Relation Age of Onset   Breast cancer Mother 29   Heart disease Father        first MI (38s), died age 27 - MI   Heart disease Paternal Grandfather    Heart disease Paternal Uncle    CVA Paternal Grandmother    Breast cancer Maternal Aunt 40   Crohn's disease Brother    Stomach cancer Maternal Grandfather    Colon cancer Neg Hx     Social History   Socioeconomic History   Marital status: Single    Spouse name: Not on file   Number of children: 0   Years of education: Not on file   Highest education level: Not on file  Occupational History   Not on file  Tobacco Use   Smoking status: Never   Smokeless tobacco: Never  Vaping Use   Vaping status: Never Used  Substance and Sexual Activity   Alcohol use: Yes    Alcohol/week: 0.0 standard drinks of alcohol    Comment: occasionally   Drug use: No   Sexual activity: Not on file  Other Topics Concern   Not on file  Social History Narrative   Not on file   Social Drivers of Health   Financial Resource Strain: Not on file  Food Insecurity: Not on file  Transportation Needs: Not on file  Physical Activity: Not on file  Stress: Not on file  Social Connections: Not on file  Intimate Partner Violence: Not on file      Review of Systems  Constitutional:  Positive for appetite change, fatigue and unexpected weight change.  Respiratory: Negative.  Negative for cough, chest tightness, shortness of breath and wheezing.   Cardiovascular: Negative.  Negative for chest pain and palpitations.  Endocrine: Positive for polydipsia, polyphagia and polyuria (including nocturia).  Genitourinary:  Negative for dysuria, menstrual problem, pelvic pain, vaginal bleeding, vaginal discharge and vaginal pain.  Psychiatric/Behavioral:  Positive for agitation (irritability).     Vital Signs: BP 135/75 Comment: 140/94  Pulse 89   Temp 98 F (36.7 C)   Resp 16   Ht 5' 3.5 (1.613 m)   Wt 188 lb 3.2 oz (85.4 kg)   SpO2 95%   BMI 32.82  kg/m    Physical Exam Vitals reviewed.  Constitutional:      General: She is not in acute distress.    Appearance: Normal appearance. She is obese. She is not ill-appearing.  HENT:     Head: Normocephalic and atraumatic.  Eyes:     Pupils: Pupils are equal, round, and reactive to light.  Cardiovascular:     Rate and Rhythm: Normal rate and regular rhythm.  Pulmonary:     Effort: Pulmonary effort is normal. No respiratory distress.  Neurological:     Mental Status: She is alert and oriented to  person, place, and time.  Psychiatric:        Mood and Affect: Mood normal.        Behavior: Behavior normal.        Assessment/Plan: 1. Type 2 diabetes mellitus with obesity (HCC) (Primary) Continue mounjaro  5 mg as prescribed.   2. Hypertension associated with diabetes (HCC) Stable, continue amlodipine  and hydrochlorothiazide  as prescribed.  - amLODipine  (NORVASC ) 5 MG tablet; Take 1 tablet (5 mg total) by mouth daily.  Dispense: 90 tablet; Refill: 3 - hydrochlorothiazide  (HYDRODIURIL ) 25 MG tablet; Take 1 tablet (25 mg total) by mouth daily.  Dispense: 90 tablet; Refill: 3  3. Hyperlipidemia associated with type 2 diabetes mellitus (HCC) Continue rosuvastatin  as prescribed.  - rosuvastatin  (CRESTOR ) 5 MG tablet; Take 1 tablet (5 mg total) by mouth daily.  Dispense: 90 tablet; Refill: 3  4. Chronic allergic rhinitis Continue cetirizine  and montelukast  as prescribed.  - cetirizine  (ZYRTEC ) 10 MG tablet; Take 1 tablet (10 mg total) by mouth daily.  Dispense: 90 tablet; Refill: 3 - montelukast  (SINGULAIR ) 10 MG tablet; Take 1 tablet (10 mg total) by mouth at bedtime.  Dispense: 90 tablet; Refill: 3  5. Vasomotor symptoms due to menopause Contineu venlafaxine  as prescribed  - venlafaxine  (EFFEXOR ) 75 MG tablet; Take 1 tablet (75 mg total) by mouth daily.  Dispense: 90 tablet; Refill: 3   General Counseling: janalyn higby understanding of the findings of todays visit and agrees  with plan of treatment. I have discussed any further diagnostic evaluation that may be needed or ordered today. We also reviewed her medications today. she has been encouraged to call the office with any questions or concerns that should arise related to todays visit.    No orders of the defined types were placed in this encounter.   Meds ordered this encounter  Medications   rosuvastatin  (CRESTOR ) 5 MG tablet    Sig: Take 1 tablet (5 mg total) by mouth daily.    Dispense:  90 tablet    Refill:  3   cetirizine  (ZYRTEC ) 10 MG tablet    Sig: Take 1 tablet (10 mg total) by mouth daily.    Dispense:  90 tablet    Refill:  3   amLODipine  (NORVASC ) 5 MG tablet    Sig: Take 1 tablet (5 mg total) by mouth daily.    Dispense:  90 tablet    Refill:  3   venlafaxine  (EFFEXOR ) 75 MG tablet    Sig: Take 1 tablet (75 mg total) by mouth daily.    Dispense:  90 tablet    Refill:  3   montelukast  (SINGULAIR ) 10 MG tablet    Sig: Take 1 tablet (10 mg total) by mouth at bedtime.    Dispense:  90 tablet    Refill:  3    For future refill   hydrochlorothiazide  (HYDRODIURIL ) 25 MG tablet    Sig: Take 1 tablet (25 mg total) by mouth daily.    Dispense:  90 tablet    Refill:  3    Return in about 3 months (around 06/18/2023) for F/U, Recheck A1C, Delecia Vastine PCP.   Total time spent:30 Minutes Time spent includes review of chart, medications, test results, and follow up plan with the patient.   [place] Controlled Substance Database was reviewed by me.  This patient was seen by Mardy Maxin, FNP-C in collaboration with Dr. Sigrid Bathe as a part of collaborative care agreement.   Jacora Hopkins R. Maxin, MSN, FNP-C Internal medicine

## 2023-03-24 ENCOUNTER — Encounter: Payer: Self-pay | Admitting: Nurse Practitioner

## 2023-04-15 ENCOUNTER — Encounter: Payer: Self-pay | Admitting: Nurse Practitioner

## 2023-05-08 ENCOUNTER — Other Ambulatory Visit: Payer: Self-pay | Admitting: Nurse Practitioner

## 2023-05-08 DIAGNOSIS — J309 Allergic rhinitis, unspecified: Secondary | ICD-10-CM

## 2023-07-16 ENCOUNTER — Other Ambulatory Visit: Payer: Self-pay | Admitting: Nurse Practitioner

## 2023-07-16 DIAGNOSIS — Z1231 Encounter for screening mammogram for malignant neoplasm of breast: Secondary | ICD-10-CM

## 2023-07-25 ENCOUNTER — Ambulatory Visit
Admission: RE | Admit: 2023-07-25 | Discharge: 2023-07-25 | Disposition: A | Discharge status: Home / Self Care | Source: Ambulatory Visit | Attending: Nurse Practitioner | Admitting: Nurse Practitioner

## 2023-07-25 DIAGNOSIS — Z1231 Encounter for screening mammogram for malignant neoplasm of breast: Secondary | ICD-10-CM | POA: Diagnosis not present

## 2023-08-09 ENCOUNTER — Encounter: Payer: BC Managed Care – PPO | Admitting: Nurse Practitioner

## 2023-09-04 ENCOUNTER — Ambulatory Visit (INDEPENDENT_AMBULATORY_CARE_PROVIDER_SITE_OTHER): Admitting: Nurse Practitioner

## 2023-09-04 ENCOUNTER — Encounter: Payer: Self-pay | Admitting: Nurse Practitioner

## 2023-09-04 VITALS — BP 136/84 | HR 99 | Temp 98.3°F | Resp 16 | Ht 63.5 in | Wt 177.4 lb

## 2023-09-04 DIAGNOSIS — E559 Vitamin D deficiency, unspecified: Secondary | ICD-10-CM

## 2023-09-04 DIAGNOSIS — E538 Deficiency of other specified B group vitamins: Secondary | ICD-10-CM | POA: Diagnosis not present

## 2023-09-04 DIAGNOSIS — Z0001 Encounter for general adult medical examination with abnormal findings: Secondary | ICD-10-CM

## 2023-09-04 DIAGNOSIS — E785 Hyperlipidemia, unspecified: Secondary | ICD-10-CM

## 2023-09-04 DIAGNOSIS — E669 Obesity, unspecified: Secondary | ICD-10-CM

## 2023-09-04 DIAGNOSIS — I152 Hypertension secondary to endocrine disorders: Secondary | ICD-10-CM

## 2023-09-04 DIAGNOSIS — E1169 Type 2 diabetes mellitus with other specified complication: Secondary | ICD-10-CM | POA: Diagnosis not present

## 2023-09-04 DIAGNOSIS — E1159 Type 2 diabetes mellitus with other circulatory complications: Secondary | ICD-10-CM | POA: Diagnosis not present

## 2023-09-04 DIAGNOSIS — E6609 Other obesity due to excess calories: Secondary | ICD-10-CM

## 2023-09-04 DIAGNOSIS — E66811 Obesity, class 1: Secondary | ICD-10-CM

## 2023-09-04 DIAGNOSIS — Z6830 Body mass index (BMI) 30.0-30.9, adult: Secondary | ICD-10-CM

## 2023-09-04 MED ORDER — TIRZEPATIDE 5 MG/0.5ML ~~LOC~~ SOAJ
5.0000 mg | SUBCUTANEOUS | 1 refills | Status: DC
Start: 2023-09-04 — End: 2023-09-04

## 2023-09-04 MED ORDER — TIRZEPATIDE 7.5 MG/0.5ML ~~LOC~~ SOAJ: 7.5000 mg | SUBCUTANEOUS | 1 refills | Status: DC

## 2023-09-04 MED ORDER — TIRZEPATIDE-WEIGHT MANAGEMENT 7.5 MG/0.5ML ~~LOC~~ SOAJ: 7.5000 mg | SUBCUTANEOUS | 1 refills | Status: DC

## 2023-09-04 NOTE — Progress Notes (Signed)
 [facility] [address] [place], [place] [address]  Internal MEDICINE  Office Visit Note  Patient Name: Rhonda Sanders  [id number]  [id number]  Date of Service: 09/04/2023  Chief Complaint  Patient presents with   Hyperlipidemia   Annual Exam   Hypertension    HPI Glender presents for an annual well visit and physical exam.  Well-appearing 63 y.o. female with hypertension, allergic rhinitis, high cholesterol, vitamin D  deficiency, prediabetes and menopausal vasomotor symptoms  Routine CRC screening: due in 2027 Routine mammogram: done in may this year  DEXA scan: due in 4 more years  Pap smear: due in August  Labs: due for routine labs  New or worsening pain: none  Other concerns: none    Current Medication: Outpatient Encounter Medications as of 09/04/2023  Medication Sig   tirzepatide  (MOUNJARO ) 7.5 MG/0.5ML Pen Inject 7.5 mg into the skin once a week.   [DISCONTINUED] tirzepatide  (ZEPBOUND ) 7.5 MG/0.5ML Pen Inject 7.5 mg into the skin once a week.   amLODipine  (NORVASC ) 5 MG tablet Take 1 tablet (5 mg total) by mouth daily.   cetirizine  (ZYRTEC ) 10 MG tablet Take 1 tablet (10 mg total) by mouth daily.   fluticasone  (FLONASE ) 50 MCG/ACT nasal spray Place 2 sprays into both nostrils daily.   hydrochlorothiazide  (HYDRODIURIL ) 25 MG tablet Take 1 tablet (25 mg total) by mouth daily.   lisinopril  (ZESTRIL ) 40 MG tablet TAKE 1 TABLET BY MOUTH EVERY DAY   montelukast  (SINGULAIR ) 10 MG tablet TAKE 1 TABLET BY MOUTH AT BEDTIME   Multiple Vitamin (MULTIVITAMIN) tablet Take 1 tablet by mouth daily.   rosuvastatin  (CRESTOR ) 5 MG tablet Take 1 tablet (5 mg total) by mouth daily.   venlafaxine  (EFFEXOR ) 75 MG tablet Take 1 tablet (75 mg total) by mouth daily.   [DISCONTINUED] tirzepatide  (MOUNJARO ) 5 MG/0.5ML Pen Inject 5 mg into the skin once a week.   [DISCONTINUED] tirzepatide  (MOUNJARO ) 5 MG/0.5ML Pen Inject 5 mg into the skin once a week.   No facility-administered  encounter medications on file as of 09/04/2023.    Surgical History: Past Surgical History:  Procedure Laterality Date   ABDOMINAL HYSTERECTOMY  04/2006   partial   BREAST BIOPSY Right 2014   CORE W/CLIP - NEG   BREAST BIOPSY Right 2015   CORE W/OUT CLIP - NEG COLUMNAR CELL CHANGE WITH MICRO CALCIFICATIONS.    BREAST EXCISIONAL BIOPSY Right 2010   EXCISIONAL -ALH, columnar cell hyperplasia   COLONOSCOPY WITH PROPOFOL  N/A 05/26/2015   Procedure: COLONOSCOPY WITH PROPOFOL ;  Surgeon: Reyes LELON Cota, MD;  Location: [facility] ENDOSCOPY;  Service: Endoscopy;  Laterality: N/A;    Medical History: Past Medical History:  Diagnosis Date   Actinic keratosis 07/18/2012   R distal med pretibial   Allergy    seasonal    Atypical lobular hyperplasia of right breast 12/25/2008   Sngle focus noted in area of columnar cell hyperplasia   Basal cell carcinoma 06/19/2006   sternum   BCC (basal cell carcinoma of skin) 05/17/2009   L lat chest sup to breast   BCC (basal cell carcinoma of skin) 05/17/2009   R med mid calf   BCC (basal cell carcinoma of skin) 06/02/2013   R sup distal lat pretibial   BCC (basal cell carcinoma of skin) 06/02/2013   R inf distal lat pretibial   BCC (basal cell carcinoma of skin) 12/08/2015   R inf med knee   BCC (basal cell carcinoma of skin) 12/08/2015   R  mid to distal lat pretibial   BCC (basal cell carcinoma of skin) 05/03/2016   L lat pretibial below knee   BCC (basal cell carcinoma of skin) 05/03/2016   L prox calf   BCC (basal cell carcinoma of skin) 05/03/2016   R prox calf prox   BCC (basal cell carcinoma of skin) 05/03/2016   R prox calf distal   Hypercholesterolemia    Hypertension    PONV (postoperative nausea and vomiting)     Family History: Family History  Problem Relation Age of Onset   Breast cancer Mother 34   Heart disease Father        first MI (106s), died age 3 - MI   Heart disease Paternal Grandfather    Heart disease Paternal Uncle     CVA Paternal Grandmother    Breast cancer Maternal Aunt 40   Crohn's disease Brother    Stomach cancer Maternal Grandfather    Colon cancer Neg Hx     Social History   Socioeconomic History   Marital status: Single    Spouse name: Not on file   Number of children: 0   Years of education: Not on file   Highest education level: Not on file  Occupational History   Not on file  Tobacco Use   Smoking status: Never   Smokeless tobacco: Never  Vaping Use   Vaping status: Never Used  Substance and Sexual Activity   Alcohol use: Yes    Alcohol/week: 0.0 standard drinks of alcohol    Comment: occasionally   Drug use: No   Sexual activity: Not on file  Other Topics Concern   Not on file  Social History Narrative   Not on file   Social Drivers of Health   Financial Resource Strain: Not on file  Food Insecurity: Not on file  Transportation Needs: Not on file  Physical Activity: Not on file  Stress: Not on file  Social Connections: Not on file  Intimate Partner Violence: Not on file      Review of Systems  Constitutional:  Negative for activity change, appetite change, chills, fatigue, fever and unexpected weight change.  HENT: Negative.  Negative for congestion, ear pain, rhinorrhea, sore throat and trouble swallowing.   Eyes: Negative.   Respiratory: Negative.  Negative for cough, chest tightness, shortness of breath and wheezing.   Cardiovascular: Negative.  Negative for chest pain.  Gastrointestinal: Negative.  Negative for abdominal pain, blood in stool, constipation, diarrhea, nausea and vomiting.  Endocrine: Negative.   Genitourinary: Negative.  Negative for difficulty urinating, dysuria, frequency, hematuria and urgency.  Musculoskeletal: Negative.  Negative for arthralgias, back pain, joint swelling, myalgias and neck pain.  Skin: Negative.  Negative for rash and wound.  Allergic/Immunologic: Negative.  Negative for immunocompromised state.  Neurological:  Negative.  Negative for dizziness, seizures, numbness and headaches.  Hematological: Negative.   Psychiatric/Behavioral: Negative.  Negative for behavioral problems, self-injury and suicidal ideas. The patient is not nervous/anxious.     Vital Signs: BP 136/84   Pulse 99   Temp 98.3 F (36.8 C)   Resp 16   Ht 5' 3.5 (1.613 m)   Wt 177 lb 6.4 oz (80.5 kg)   SpO2 95%   BMI 30.93 kg/m    Physical Exam Vitals reviewed.  Constitutional:      General: She is awake. She is not in acute distress.    Appearance: Normal appearance. She is well-developed and well-groomed. She is obese. She is not  ill-appearing or diaphoretic.  HENT:     Head: Normocephalic and atraumatic.     Right Ear: Tympanic membrane, ear canal and external ear normal.     Left Ear: Tympanic membrane, ear canal and external ear normal.     Nose: Nose normal.     Mouth/Throat:     Lips: Pink.     Mouth: Mucous membranes are moist.     Pharynx: Oropharynx is clear. Uvula midline. No oropharyngeal exudate or posterior oropharyngeal erythema.  Eyes:     General: Lids are normal. Vision grossly intact. Gaze aligned appropriately. No scleral icterus.       Right eye: No discharge.        Left eye: No discharge.     Conjunctiva/sclera: Conjunctivae normal.     Pupils: Pupils are equal, round, and reactive to light.     Funduscopic exam:    Right eye: Red reflex present.        Left eye: Red reflex present. Neck:     Thyroid : No thyromegaly.     Vascular: No JVD.     Trachea: Trachea and phonation normal. No tracheal deviation.  Cardiovascular:     Rate and Rhythm: Normal rate and regular rhythm.     Pulses: Normal pulses.     Heart sounds: Normal heart sounds, S1 normal and S2 normal. No murmur heard.    No friction rub. No gallop.  Pulmonary:     Effort: Pulmonary effort is normal. No accessory muscle usage or respiratory distress.     Breath sounds: Normal breath sounds and air entry. No stridor. No wheezing  or rales.  Chest:     Chest wall: No tenderness.     Comments: Declined clinical breast exam Abdominal:     General: Bowel sounds are normal. There is no distension.     Palpations: Abdomen is soft. There is no shifting dullness, fluid wave, mass or pulsatile mass.     Tenderness: There is no abdominal tenderness. There is no guarding or rebound.  Musculoskeletal:        General: No tenderness or deformity. Normal range of motion.     Cervical back: Normal range of motion and neck supple.     Right lower leg: No edema.     Left lower leg: No edema.  Lymphadenopathy:     Cervical: No cervical adenopathy.  Skin:    General: Skin is warm and dry.     Coloration: Skin is not pale.     Findings: No erythema or rash.  Neurological:     Mental Status: She is alert.     Cranial Nerves: No cranial nerve deficit.     Motor: No abnormal muscle tone.     Coordination: Coordination normal.     Deep Tendon Reflexes: Reflexes are normal and symmetric.  Psychiatric:        Behavior: Behavior normal. Behavior is cooperative.        Thought Content: Thought content normal.        Judgment: Judgment normal.        Assessment/Plan: 1. Encounter for routine adult health examination with abnormal findings (Primary) Age-appropriate preventive screenings and vaccinations discussed, annual physical exam completed. Routine labs for health maintenance ordered, see below. PHM updated.   - CBC with Differential/Platelet - CMP14+EGFR - Lipid Profile - Hgb A1C w/o eAG - B12 and Folate Panel - Vitamin D  (25 hydroxy)  2. Type 2 diabetes mellitus with obesity (HCC) Routine labs ordered.  Continue mounjaro  as prescribed, dose increased to 7.5 mg weekly.  - CBC with Differential/Platelet - CMP14+EGFR - Lipid Profile - Hgb A1C w/o eAG  3. Hypertension associated with diabetes (HCC) Stable, continue amlodipine  and lisinopril  as prescribed.  - CBC with Differential/Platelet - CMP14+EGFR - Lipid  Profile  4. Hyperlipidemia associated with type 2 diabetes mellitus (HCC) Routine labs ordered. Continue rosuvastatin  as prescribed.  - CBC with Differential/Platelet - CMP14+EGFR - Lipid Profile - Hgb A1C w/o eAG  5. B12 deficiency Routine labs ordered  - CBC with Differential/Platelet - B12 and Folate Panel  6. Vitamin D  deficiency Routine lab ordered . - Vitamin D  (25 hydroxy)  7. Class 1 obesity due to excess calories with serious comorbidity and body mass index (BMI) of 30.0 to 30.9 in adult Mounjaro  dose increased to 7.5 mg weekly.  - tirzepatide  (MOUNJARO ) 7.5 MG/0.5ML Pen; Inject 7.5 mg into the skin once a week.  Dispense: 6 mL; Refill: 1      General Counseling: Lecretia verbalizes understanding of the findings of todays visit and agrees with plan of treatment. I have discussed any further diagnostic evaluation that may be needed or ordered today. We also reviewed her medications today. she has been encouraged to call the office with any questions or concerns that should arise related to todays visit.    Orders Placed This Encounter  Procedures   CBC with Differential/Platelet   CMP14+EGFR   Lipid Profile   Hgb A1C w/o eAG   B12 and Folate Panel   Vitamin D  (25 hydroxy)    Meds ordered this encounter  Medications   DISCONTD: tirzepatide  (MOUNJARO ) 5 MG/0.5ML Pen    Sig: Inject 5 mg into the skin once a week.    Dispense:  6 mL    Refill:  1    Dx code E11.65   DISCONTD: tirzepatide  (ZEPBOUND ) 7.5 MG/0.5ML Pen    Sig: Inject 7.5 mg into the skin once a week.    Dispense:  6 mL    Refill:  1    Discontinue 5 mg dose and fill new script with increased dose at 7.5 mg now   tirzepatide  (MOUNJARO ) 7.5 MG/0.5ML Pen    Sig: Inject 7.5 mg into the skin once a week.    Dispense:  6 mL    Refill:  1    Disregard all previous tirzepatide  orders and fill new script for 7.5 mg dose today    Return in about 3 months (around 12/05/2023) for F/U, Aniket Paye PCP.   Total  time spent:30 Minutes Time spent includes review of chart, medications, test results, and follow up plan with the patient.   [place] Controlled Substance Database was reviewed by me.  This patient was seen by Mardy Maxin, FNP-C in collaboration with Dr. Sigrid Bathe as a part of collaborative care agreement.  Dionisio Aragones R. Maxin, MSN, FNP-C Internal medicine

## 2023-09-07 ENCOUNTER — Encounter: Payer: Self-pay | Admitting: Nurse Practitioner

## 2023-12-10 NOTE — Progress Notes (Signed)
 Rhonda Sanders                                          MRN: [id number]   12/10/2023   The [facility] Quality Team Specialist reviewed this patient medical record for the purposes of chart review for care gap closure. The following were reviewed: chart review for care gap closure-glycemic status assessment.    [facility] Quality Team

## 2023-12-14 DIAGNOSIS — I152 Hypertension secondary to endocrine disorders: Secondary | ICD-10-CM | POA: Diagnosis not present

## 2023-12-14 DIAGNOSIS — E559 Vitamin D deficiency, unspecified: Secondary | ICD-10-CM | POA: Diagnosis not present

## 2023-12-14 DIAGNOSIS — E1159 Type 2 diabetes mellitus with other circulatory complications: Secondary | ICD-10-CM | POA: Diagnosis not present

## 2023-12-14 DIAGNOSIS — E1169 Type 2 diabetes mellitus with other specified complication: Secondary | ICD-10-CM | POA: Diagnosis not present

## 2023-12-14 DIAGNOSIS — E669 Obesity, unspecified: Secondary | ICD-10-CM | POA: Diagnosis not present

## 2023-12-14 DIAGNOSIS — E538 Deficiency of other specified B group vitamins: Secondary | ICD-10-CM | POA: Diagnosis not present

## 2023-12-15 LAB — CMP14+EGFR
ALT: 27 IU/L (ref 0–32)
AST: 25 IU/L (ref 0–40)
Albumin: 5 g/dL — ABNORMAL HIGH (ref 3.9–4.9)
Alkaline Phosphatase: 105 IU/L (ref 49–135)
BUN/Creatinine Ratio: 18 (ref 12–28)
BUN: 22 mg/dL (ref 8–27)
Bilirubin Total: 0.5 mg/dL (ref 0.0–1.2)
CO2: 22 mmol/L (ref 20–29)
Calcium: 10.5 mg/dL — ABNORMAL HIGH (ref 8.7–10.3)
Chloride: 99 mmol/L (ref 96–106)
Creatinine, Ser: 1.25 mg/dL — ABNORMAL HIGH (ref 0.57–1.00)
Globulin, Total: 3 g/dL (ref 1.5–4.5)
Glucose: 104 mg/dL — ABNORMAL HIGH (ref 70–99)
Potassium: 4.2 mmol/L (ref 3.5–5.2)
Sodium: 140 mmol/L (ref 134–144)
Total Protein: 8 g/dL (ref 6.0–8.5)
eGFR: 49 mL/min/1.73 — ABNORMAL LOW (ref >59)

## 2023-12-15 LAB — CBC WITH DIFFERENTIAL/PLATELET
Basophils Absolute: 0.1 x10E3/uL (ref 0.0–0.2)
Basos: 1 %
EOS (ABSOLUTE): 0.1 x10E3/uL (ref 0.0–0.4)
Eos: 1 %
Hematocrit: 46.7 % — ABNORMAL HIGH (ref 34.0–46.6)
Hemoglobin: 15 g/dL (ref 11.1–15.9)
Immature Grans (Abs): 0 x10E3/uL (ref 0.0–0.1)
Immature Granulocytes: 0 %
Lymphocytes Absolute: 2.8 x10E3/uL (ref 0.7–3.1)
Lymphs: 26 %
MCH: 29.8 pg (ref 26.6–33.0)
MCHC: 32.1 g/dL (ref 31.5–35.7)
MCV: 93 fL (ref 79–97)
Monocytes Absolute: 0.5 x10E3/uL (ref 0.1–0.9)
Monocytes: 5 %
Neutrophils Absolute: 7.1 x10E3/uL — ABNORMAL HIGH (ref 1.4–7.0)
Neutrophils: 67 %
Platelets: 476 x10E3/uL — ABNORMAL HIGH (ref 150–450)
RBC: 5.03 x10E6/uL (ref 3.77–5.28)
RDW: 12.7 % (ref 11.7–15.4)
WBC: 10.6 x10E3/uL (ref 3.4–10.8)

## 2023-12-15 LAB — LIPID PANEL
Chol/HDL Ratio: 3.9 ratio (ref 0.0–4.4)
Cholesterol, Total: 193 mg/dL (ref 100–199)
HDL: 49 mg/dL (ref >39)
LDL Chol Calc (NIH): 110 mg/dL — ABNORMAL HIGH (ref 0–99)
Triglycerides: 193 mg/dL — ABNORMAL HIGH (ref 0–149)
VLDL Cholesterol Cal: 34 mg/dL (ref 5–40)

## 2023-12-15 LAB — VITAMIN D 25 HYDROXY (VIT D DEFICIENCY, FRACTURES): Vit D, 25-Hydroxy: 45 ng/mL (ref 30.0–100.0)

## 2023-12-15 LAB — HGB A1C W/O EAG: Hgb A1c MFr Bld: 6 % — ABNORMAL HIGH (ref 4.8–5.6)

## 2023-12-15 LAB — B12 AND FOLATE PANEL
Folate: >20 ng/mL (ref >3.0)
Vitamin B-12: 1049 pg/mL (ref 232–1245)

## 2023-12-18 ENCOUNTER — Encounter: Payer: Self-pay | Admitting: Nurse Practitioner

## 2023-12-18 ENCOUNTER — Ambulatory Visit (INDEPENDENT_AMBULATORY_CARE_PROVIDER_SITE_OTHER): Admitting: Nurse Practitioner

## 2023-12-18 DIAGNOSIS — E1159 Type 2 diabetes mellitus with other circulatory complications: Secondary | ICD-10-CM | POA: Diagnosis not present

## 2023-12-18 DIAGNOSIS — I152 Hypertension secondary to endocrine disorders: Secondary | ICD-10-CM

## 2023-12-18 DIAGNOSIS — E1169 Type 2 diabetes mellitus with other specified complication: Secondary | ICD-10-CM | POA: Diagnosis not present

## 2023-12-18 DIAGNOSIS — N1831 Chronic kidney disease, stage 3a: Secondary | ICD-10-CM

## 2023-12-18 DIAGNOSIS — E785 Hyperlipidemia, unspecified: Secondary | ICD-10-CM

## 2023-12-18 DIAGNOSIS — Z23 Encounter for immunization: Secondary | ICD-10-CM | POA: Diagnosis not present

## 2023-12-18 MED ORDER — PNEUMOCOCCAL 20-VAL CONJ VACC 0.5 ML IM SUSY: 0.5000 mL | PREFILLED_SYRINGE | Freq: Once | INTRAMUSCULAR | 0 refills | Status: AC | PRN

## 2023-12-18 MED ORDER — ZOSTER VAC RECOMB ADJUVANTED 50 MCG/0.5ML IM SUSR: 0.5000 mL | Freq: Once | INTRAMUSCULAR | 1 refills | Status: AC | PRN

## 2023-12-18 NOTE — Progress Notes (Signed)
 [facility] [address] [place], [place] [address]  Internal MEDICINE  Office Visit Note  Patient Name: Rhonda Sanders  [id number]  [id number]  Date of Service: 12/18/2023  Chief Complaint  Patient presents with   Hyperlipidemia   Hypertension   Follow-up    HPI Venus presents for a follow-up visit for high calcium  level, diabetes, hypertension, high cholesterol, and CKD, and vaccinations Hypercalcemia -- need additional labs for further workup Diabetes -- currently on mounjaro .  Hypertension -- controlled with current medications High cholesterol -- takes rosuvastatin  daily.  CKDS stage 3a -- monitor labs periodically and currently on lisinopril .  Requesting the flu vaccine Due for prevnar 20 and shingles vaccine.    Current Medication: Outpatient Encounter Medications as of 12/18/2023  Medication Sig   pneumococcal 20-valent conjugate vaccine (PREVNAR 20) 0.5 ML injection Inject 0.5 mLs into the muscle once as needed for up to 1 dose for immunization.   Zoster Vaccine Adjuvanted [facility]) injection Inject 0.5 mLs into the muscle once as needed for up to 1 dose.   amLODipine  (NORVASC ) 5 MG tablet Take 1 tablet (5 mg total) by mouth daily.   cetirizine  (ZYRTEC ) 10 MG tablet Take 1 tablet (10 mg total) by mouth daily.   hydrochlorothiazide  (HYDRODIURIL ) 25 MG tablet Take 1 tablet (25 mg total) by mouth daily.   lisinopril  (ZESTRIL ) 40 MG tablet TAKE 1 TABLET BY MOUTH EVERY DAY   montelukast  (SINGULAIR ) 10 MG tablet TAKE 1 TABLET BY MOUTH AT BEDTIME   Multiple Vitamin (MULTIVITAMIN) tablet Take 1 tablet by mouth daily.   rosuvastatin  (CRESTOR ) 5 MG tablet Take 1 tablet (5 mg total) by mouth daily.   tirzepatide  (MOUNJARO ) 7.5 MG/0.5ML Pen Inject 7.5 mg into the skin once a week.   venlafaxine  (EFFEXOR ) 75 MG tablet Take 1 tablet (75 mg total) by mouth daily.   [DISCONTINUED] fluticasone  (FLONASE ) 50 MCG/ACT nasal spray Place 2 sprays into both nostrils daily.    No facility-administered encounter medications on file as of 12/18/2023.    Surgical History: Past Surgical History:  Procedure Laterality Date   ABDOMINAL HYSTERECTOMY  04/2006   partial   BREAST BIOPSY Right 2014   CORE W/CLIP - NEG   BREAST BIOPSY Right 2015   CORE W/OUT CLIP - NEG COLUMNAR CELL CHANGE WITH MICRO CALCIFICATIONS.    BREAST EXCISIONAL BIOPSY Right 2010   EXCISIONAL -ALH, columnar cell hyperplasia   COLONOSCOPY WITH PROPOFOL  N/A 05/26/2015   Procedure: COLONOSCOPY WITH PROPOFOL ;  Surgeon: Reyes LELON Cota, MD;  Location: [facility] ENDOSCOPY;  Service: Endoscopy;  Laterality: N/A;    Medical History: Past Medical History:  Diagnosis Date   Actinic keratosis 07/18/2012   R distal med pretibial   Allergy    seasonal    Atypical lobular hyperplasia of right breast 12/25/2008   Sngle focus noted in area of columnar cell hyperplasia   Basal cell carcinoma 06/19/2006   sternum   BCC (basal cell carcinoma of skin) 05/17/2009   L lat chest sup to breast   BCC (basal cell carcinoma of skin) 05/17/2009   R med mid calf   BCC (basal cell carcinoma of skin) 06/02/2013   R sup distal lat pretibial   BCC (basal cell carcinoma of skin) 06/02/2013   R inf distal lat pretibial   BCC (basal cell carcinoma of skin) 12/08/2015   R inf med knee   BCC (basal cell carcinoma of skin) 12/08/2015   R mid to distal lat pretibial   BCC (  basal cell carcinoma of skin) 05/03/2016   L lat pretibial below knee   BCC (basal cell carcinoma of skin) 05/03/2016   L prox calf   BCC (basal cell carcinoma of skin) 05/03/2016   R prox calf prox   BCC (basal cell carcinoma of skin) 05/03/2016   R prox calf distal   Hypercholesterolemia    Hypertension    PONV (postoperative nausea and vomiting)     Family History: Family History  Problem Relation Age of Onset   Breast cancer Mother 25   Heart disease Father        first MI (88s), died age 40 - MI   Heart disease Paternal Grandfather     Heart disease Paternal Uncle    CVA Paternal Grandmother    Breast cancer Maternal Aunt 40   Crohn's disease Brother    Stomach cancer Maternal Grandfather    Colon cancer Neg Hx     Social History   Socioeconomic History   Marital status: Single    Spouse name: Not on file   Number of children: 0   Years of education: Not on file   Highest education level: Not on file  Occupational History   Not on file  Tobacco Use   Smoking status: Never   Smokeless tobacco: Never  Vaping Use   Vaping status: Never Used  Substance and Sexual Activity   Alcohol use: Yes    Alcohol/week: 0.0 standard drinks of alcohol    Comment: occasionally   Drug use: No   Sexual activity: Not on file  Other Topics Concern   Not on file  Social History Narrative   Not on file   Social Drivers of Health   Financial Resource Strain: Not on file  Food Insecurity: Not on file  Transportation Needs: Not on file  Physical Activity: Not on file  Stress: Not on file  Social Connections: Not on file  Intimate Partner Violence: Not on file      Review of Systems  Constitutional:  Positive for appetite change, fatigue and unexpected weight change.  Respiratory: Negative.  Negative for cough, chest tightness, shortness of breath and wheezing.   Cardiovascular: Negative.  Negative for chest pain and palpitations.  Endocrine: Positive for polydipsia, polyphagia and polyuria (including nocturia).  Genitourinary:  Negative for dysuria, menstrual problem, pelvic pain, vaginal bleeding, vaginal discharge and vaginal pain.  Psychiatric/Behavioral:  Positive for agitation (irritability).     Vital Signs: BP 136/84   Pulse 83   Temp (!) 97.5 F (36.4 C)   Resp 16   Ht 5' 3.5 (1.613 m)   Wt 170 lb 12.8 oz (77.5 kg)   SpO2 97%   BMI 29.78 kg/m    Physical Exam Vitals reviewed.  Constitutional:      General: She is not in acute distress.    Appearance: Normal appearance. She is obese. She is  not ill-appearing.  HENT:     Head: Normocephalic and atraumatic.  Eyes:     Pupils: Pupils are equal, round, and reactive to light.  Cardiovascular:     Rate and Rhythm: Normal rate and regular rhythm.  Pulmonary:     Effort: Pulmonary effort is normal. No respiratory distress.  Musculoskeletal:     Cervical back: Normal range of motion and neck supple.  Skin:    General: Skin is warm and dry.     Capillary Refill: Capillary refill takes less than 2 seconds.  Neurological:     Mental  Status: She is alert and oriented to person, place, and time.  Psychiatric:        Mood and Affect: Mood normal.        Behavior: Behavior normal.        Assessment/Plan: 1. Hypercalcemia (Primary) Additional labs ordered  - Basic Metabolic Panel (BMET) - Parathyroid hormone, intact (no Ca) - Phosphorus  2. Type 2 diabetes mellitus with other specified complication, without long-term current use of insulin (HCC) Continue mounjaro  for now. Urine sent for microalbumin/creatinine ratio.  - Urine Microalbumin w/creat. ratio  3. CKD stage 3a, GFR 45-59 ml/min (HCC) Additional labs ordered  - Basic Metabolic Panel (BMET) - Parathyroid hormone, intact (no Ca) - Phosphorus - Urine Microalbumin w/creat. ratio  4. Hypertension associated with diabetes (HCC) Continue amlodipine , bisoprolol-hydrochlorothiazide  and lisinopril  as prescribed   5. Hyperlipidemia associated with type 2 diabetes mellitus (HCC) Continue rosuvastatin  as prescribed   6. Need for vaccination Flu vaccine administered in office today. Prevnar 20 and shingrix prescriptions sent to her pharmacy.  - Influenza, MDCK, trivalent, PF(Flucelvax egg-free) - pneumococcal 20-valent conjugate vaccine (PREVNAR 20) 0.5 ML injection; Inject 0.5 mLs into the muscle once as needed for up to 1 dose for immunization.  Dispense: 0.5 mL; Refill: 0 - Zoster Vaccine Adjuvanted [facility]) injection; Inject 0.5 mLs into the muscle once as needed  for up to 1 dose.  Dispense: 0.5 mL; Refill: 1   General Counseling: lekia nier understanding of the findings of todays visit and agrees with plan of treatment. I have discussed any further diagnostic evaluation that may be needed or ordered today. We also reviewed her medications today. she has been encouraged to call the office with any questions or concerns that should arise related to todays visit.    Orders Placed This Encounter  Procedures   Influenza, MDCK, trivalent, PF(Flucelvax egg-free)   Basic Metabolic Panel (BMET)   Parathyroid hormone, intact (no Ca)   Phosphorus   Urine Microalbumin w/creat. ratio    Meds ordered this encounter  Medications   pneumococcal 20-valent conjugate vaccine (PREVNAR 20) 0.5 ML injection    Sig: Inject 0.5 mLs into the muscle once as needed for up to 1 dose for immunization.    Dispense:  0.5 mL    Refill:  0    Due for prevnar 20   Zoster Vaccine Adjuvanted [facility] injection    Sig: Inject 0.5 mLs into the muscle once as needed for up to 1 dose.    Dispense:  0.5 mL    Refill:  1    Patient needs to get 2 dose series. The initial dose was over a year ago so she needs to start over.    Return in about 2 months (around 02/17/2024) for F/U, PAP ONLY, Jon Kasparek PCP.   Total time spent:30 Minutes Time spent includes review of chart, medications, test results, and follow up plan with the patient.   [place] Controlled Substance Database was reviewed by me.  This patient was seen by Mardy Maxin, FNP-C in collaboration with Dr. Sigrid Bathe as a part of collaborative care agreement.   Adalia Pettis R. Maxin, MSN, FNP-C Internal medicine

## 2023-12-19 LAB — MICROALBUMIN / CREATININE URINE RATIO
Creatinine, Urine: 75 mg/dL
Microalb/Creat Ratio: 8 mg/g{creat} (ref 0–29)
Microalbumin, Urine: 5.9 ug/mL

## 2023-12-26 ENCOUNTER — Ambulatory Visit: Payer: Self-pay | Admitting: Internal Medicine

## 2024-01-01 ENCOUNTER — Encounter: Payer: Self-pay | Admitting: Internal Medicine

## 2024-01-01 ENCOUNTER — Ambulatory Visit (INDEPENDENT_AMBULATORY_CARE_PROVIDER_SITE_OTHER): Admitting: Internal Medicine

## 2024-01-01 DIAGNOSIS — E785 Hyperlipidemia, unspecified: Secondary | ICD-10-CM

## 2024-01-01 DIAGNOSIS — N1831 Chronic kidney disease, stage 3a: Secondary | ICD-10-CM

## 2024-01-01 DIAGNOSIS — E1169 Type 2 diabetes mellitus with other specified complication: Secondary | ICD-10-CM | POA: Diagnosis not present

## 2024-01-01 DIAGNOSIS — I10 Essential (primary) hypertension: Secondary | ICD-10-CM | POA: Diagnosis not present

## 2024-01-01 MED ORDER — BISOPROLOL-HYDROCHLOROTHIAZIDE 2.5-6.25 MG PO TABS: 1.0000 | ORAL_TABLET | Freq: Every day | ORAL | 1 refills | Status: AC

## 2024-01-01 NOTE — Progress Notes (Addendum)
 [facility] [address] [place], [place] [address]  Internal MEDICINE  Office Visit Note  Patient Name: Rhonda Sanders  [id number]  [id number]  Date of Service: 01/01/2024  Chief Complaint  Patient presents with   Follow-up    Discuss abnormal labs   Quality Metric Gaps    TDAP    HPI  Pt is seen for abnormal Labs Patient has elevated calcium  and he is on Mounjaro  as well She also has elevated creatinine with elevated protein level Blood pressure is uncontrolled with tachycardia She denies any chest pain shortness of breath fever or chills   Current Medication: Outpatient Encounter Medications as of 01/01/2024  Medication Sig   amLODipine  (NORVASC ) 5 MG tablet Take 1 tablet (5 mg total) by mouth daily.   bisoprolol-hydrochlorothiazide  (ZIAC) 2.5-6.25 MG tablet Take 1 tablet by mouth daily.   cetirizine  (ZYRTEC ) 10 MG tablet Take 1 tablet (10 mg total) by mouth daily.   hydrochlorothiazide  (HYDRODIURIL ) 25 MG tablet Take 1 tablet (25 mg total) by mouth daily.   lisinopril  (ZESTRIL ) 40 MG tablet TAKE 1 TABLET BY MOUTH EVERY DAY   montelukast  (SINGULAIR ) 10 MG tablet TAKE 1 TABLET BY MOUTH AT BEDTIME   Multiple Vitamin (MULTIVITAMIN) tablet Take 1 tablet by mouth daily.   pneumococcal 20-valent conjugate vaccine (PREVNAR 20) 0.5 ML injection Inject 0.5 mLs into the muscle once as needed for up to 1 dose for immunization.   rosuvastatin  (CRESTOR ) 5 MG tablet Take 1 tablet (5 mg total) by mouth daily.   tirzepatide  (MOUNJARO ) 7.5 MG/0.5ML Pen Inject 7.5 mg into the skin once a week.   venlafaxine  (EFFEXOR ) 75 MG tablet Take 1 tablet (75 mg total) by mouth daily.   Zoster Vaccine Adjuvanted [facility]) injection Inject 0.5 mLs into the muscle once as needed for up to 1 dose.   [DISCONTINUED] fluticasone  (FLONASE ) 50 MCG/ACT nasal spray Place 2 sprays into both nostrils daily.   No facility-administered encounter medications on file as of 01/01/2024.    Surgical  History: Past Surgical History:  Procedure Laterality Date   ABDOMINAL HYSTERECTOMY  04/2006   partial   BREAST BIOPSY Right 2014   CORE W/CLIP - NEG   BREAST BIOPSY Right 2015   CORE W/OUT CLIP - NEG COLUMNAR CELL CHANGE WITH MICRO CALCIFICATIONS.    BREAST EXCISIONAL BIOPSY Right 2010   EXCISIONAL -ALH, columnar cell hyperplasia   COLONOSCOPY WITH PROPOFOL  N/A 05/26/2015   Procedure: COLONOSCOPY WITH PROPOFOL ;  Surgeon: Reyes LELON Cota, MD;  Location: [facility] ENDOSCOPY;  Service: Endoscopy;  Laterality: N/A;    Medical History: Past Medical History:  Diagnosis Date   Actinic keratosis 07/18/2012   R distal med pretibial   Allergy    seasonal    Atypical lobular hyperplasia of right breast 12/25/2008   Sngle focus noted in area of columnar cell hyperplasia   Basal cell carcinoma 06/19/2006   sternum   BCC (basal cell carcinoma of skin) 05/17/2009   L lat chest sup to breast   BCC (basal cell carcinoma of skin) 05/17/2009   R med mid calf   BCC (basal cell carcinoma of skin) 06/02/2013   R sup distal lat pretibial   BCC (basal cell carcinoma of skin) 06/02/2013   R inf distal lat pretibial   BCC (basal cell carcinoma of skin) 12/08/2015   R inf med knee   BCC (basal cell carcinoma of skin) 12/08/2015   R mid to distal lat pretibial   BCC (basal cell carcinoma  of skin) 05/03/2016   L lat pretibial below knee   BCC (basal cell carcinoma of skin) 05/03/2016   L prox calf   BCC (basal cell carcinoma of skin) 05/03/2016   R prox calf prox   BCC (basal cell carcinoma of skin) 05/03/2016   R prox calf distal   Hypercholesterolemia    Hypertension    PONV (postoperative nausea and vomiting)     Family History: Family History  Problem Relation Age of Onset   Breast cancer Mother 57   Heart disease Father        first MI (94s), died age 31 - MI   Heart disease Paternal Grandfather    Heart disease Paternal Uncle    CVA Paternal Grandmother    Breast cancer Maternal Aunt  40   Crohn's disease Brother    Stomach cancer Maternal Grandfather    Colon cancer Neg Hx     Social History   Socioeconomic History   Marital status: Single    Spouse name: Not on file   Number of children: 0   Years of education: Not on file   Highest education level: Not on file  Occupational History   Not on file  Tobacco Use   Smoking status: Never   Smokeless tobacco: Never  Vaping Use   Vaping status: Never Used  Substance and Sexual Activity   Alcohol use: Yes    Alcohol/week: 0.0 standard drinks of alcohol    Comment: occasionally   Drug use: No   Sexual activity: Not on file  Other Topics Concern   Not on file  Social History Narrative   Not on file   Social Drivers of Health   Financial Resource Strain: Not on file  Food Insecurity: Not on file  Transportation Needs: Not on file  Physical Activity: Not on file  Stress: Not on file  Social Connections: Not on file  Intimate Partner Violence: Not on file      Review of Systems  Constitutional:  Negative for chills, fatigue and unexpected weight change.  HENT:  Positive for postnasal drip. Negative for congestion, rhinorrhea, sneezing and sore throat.   Eyes:  Negative for redness.  Respiratory:  Negative for cough, chest tightness and shortness of breath.   Cardiovascular:  Negative for chest pain and palpitations.  Gastrointestinal:  Negative for abdominal pain, constipation, diarrhea, nausea and vomiting.  Genitourinary:  Negative for dysuria and frequency.  Musculoskeletal:  Negative for arthralgias, back pain, joint swelling and neck pain.  Skin:  Negative for rash.  Neurological: Negative.  Negative for tremors and numbness.  Hematological:  Negative for adenopathy. Does not bruise/bleed easily.  Psychiatric/Behavioral:  Negative for behavioral problems (Depression), sleep disturbance and suicidal ideas. The patient is not nervous/anxious.     Vital Signs: BP 110/81   Pulse 96   Temp 98.3  F (36.8 C)   Resp 16   Ht 5' 3.5 (1.613 m)   Wt 167 lb 3.2 oz (75.8 kg)   SpO2 97%   BMI 29.15 kg/m    Physical Exam Constitutional:      Appearance: Normal appearance.  HENT:     Head: Normocephalic and atraumatic.     Nose: Nose normal.     Mouth/Throat:     Mouth: Mucous membranes are moist.     Pharynx: No posterior oropharyngeal erythema.  Eyes:     Extraocular Movements: Extraocular movements intact.     Pupils: Pupils are equal, round, and reactive to  light.  Cardiovascular:     Pulses: Normal pulses.     Heart sounds: Normal heart sounds.  Pulmonary:     Effort: Pulmonary effort is normal.     Breath sounds: Normal breath sounds.  Neurological:     General: No focal deficit present.     Mental Status: She is alert.  Psychiatric:        Mood and Affect: Mood normal.        Behavior: Behavior normal.        Assessment/Plan: 1. Hypercalcemia (Primary) Patient is on Mounjaro , elevated calcium  elevated creatinine and elevated protein, need to look into multiple myeloma or primary hyperparathyroidism patient has already intact PTH and phosphorous ordered to be checked - Calcitonin - Protein Electrophoresis, (serum) - Protein Electrophoresis, Urine Rflx. - Kappa/lambda light chains  2. Stage 3a chronic kidney disease (HCC) Worsening renal functions, might need further diagnostics - Calcitonin - Protein Electrophoresis, (serum) - Protein Electrophoresis, Urine Rflx. - Kappa/lambda light chains  3. Hyperlipidemia associated with type 2 diabetes mellitus (HCC) Continue atorvastatin  as before  4. Type 2 diabetes mellitus with other specified complication, without long-term current use of insulin (HCC) Continue Mounjaro  as before  5. Benign hypertension Patient is on triple therapy for hypertension however she is still tachycardic, will decrease her lisinopril  to 20 mg once a day continue HCTZ add low-dose bisoprolol, will modify therapy according to her  response to blood pressure and due to tachycardia  General Counseling: shaela boer understanding of the findings of todays visit and agrees with plan of treatment. I have discussed any further diagnostic evaluation that may be needed or ordered today. We also reviewed her medications today. she has been encouraged to call the office with any questions or concerns that should arise related to todays visit.    Orders Placed This Encounter  Procedures   Calcitonin   Protein Electrophoresis, (serum)   Protein Electrophoresis, Urine Rflx.   Kappa/lambda light chains    Meds ordered this encounter  Medications   bisoprolol-hydrochlorothiazide  (ZIAC) 2.5-6.25 MG tablet    Sig: Take 1 tablet by mouth daily.    Dispense:  30 tablet    Refill:  1    Total time spent:35 Minutes Time spent includes review of chart, medications, test results, and follow up plan with the patient.   [place] Controlled Substance Database was reviewed by me.   Dr Katura Eatherly M Kennita Pavlovich Internal medicine

## 2024-01-16 ENCOUNTER — Encounter: Payer: Self-pay | Admitting: Nurse Practitioner

## 2024-01-16 DIAGNOSIS — E119 Type 2 diabetes mellitus without complications: Secondary | ICD-10-CM | POA: Insufficient documentation

## 2024-01-16 DIAGNOSIS — E1159 Type 2 diabetes mellitus with other circulatory complications: Secondary | ICD-10-CM | POA: Insufficient documentation

## 2024-01-16 DIAGNOSIS — N1831 Chronic kidney disease, stage 3a: Secondary | ICD-10-CM | POA: Insufficient documentation

## 2024-01-17 DIAGNOSIS — R7989 Other specified abnormal findings of blood chemistry: Secondary | ICD-10-CM | POA: Diagnosis not present

## 2024-01-18 LAB — PROTEIN ELECTROPHORESIS, URINE REFLEX

## 2024-01-19 ENCOUNTER — Other Ambulatory Visit: Payer: Self-pay | Admitting: Nurse Practitioner

## 2024-01-19 DIAGNOSIS — E669 Obesity, unspecified: Secondary | ICD-10-CM

## 2024-01-19 LAB — BASIC METABOLIC PANEL WITH GFR
BUN/Creatinine Ratio: 29 — ABNORMAL HIGH (ref 12–28)
BUN: 32 mg/dL — ABNORMAL HIGH (ref 8–27)
CO2: 24 mmol/L (ref 20–29)
Calcium: 10.2 mg/dL (ref 8.7–10.3)
Chloride: 99 mmol/L (ref 96–106)
Creatinine, Ser: 1.09 mg/dL — ABNORMAL HIGH (ref 0.57–1.00)
Glucose: 98 mg/dL (ref 70–99)
Potassium: 4.3 mmol/L (ref 3.5–5.2)
Sodium: 139 mmol/L (ref 134–144)
eGFR: 58 mL/min/1.73 — ABNORMAL LOW (ref >59)

## 2024-01-19 LAB — PARATHYROID HORMONE, INTACT (NO CA): PTH: 24 pg/mL (ref 15–65)

## 2024-01-19 LAB — PHOSPHORUS: Phosphorus: 3.8 mg/dL (ref 3.0–4.3)

## 2024-01-22 ENCOUNTER — Ambulatory Visit: Payer: Self-pay | Admitting: Internal Medicine

## 2024-01-22 ENCOUNTER — Other Ambulatory Visit: Payer: Self-pay

## 2024-01-22 DIAGNOSIS — E6609 Other obesity due to excess calories: Secondary | ICD-10-CM

## 2024-01-22 LAB — PROTEIN ELECTROPHORESIS, SERUM
A/G Ratio: 1.2 (ref 0.7–1.7)
Albumin ELP: 4.2 g/dL (ref 2.9–4.4)
Alpha 1: 0.3 g/dL (ref 0.0–0.4)
Alpha 2: 0.9 g/dL (ref 0.4–1.0)
Beta: 1.2 g/dL (ref 0.7–1.3)
Gamma Globulin: 1.3 g/dL (ref 0.4–1.8)
Globulin, Total: 3.6 g/dL (ref 2.2–3.9)
Total Protein: 7.8 g/dL (ref 6.0–8.5)

## 2024-01-22 LAB — KAPPA/LAMBDA LIGHT CHAINS
Ig Kappa Free Light Chain: 24.6 mg/L — ABNORMAL HIGH (ref 3.3–19.4)
Ig Lambda Free Light Chain: 14.4 mg/L (ref 5.7–26.3)
KAPPA/LAMBDA RATIO: 1.71 — ABNORMAL HIGH (ref 0.26–1.65)

## 2024-01-22 LAB — PROTEIN ELECTROPHORESIS, URINE REFLEX
Albumin ELP, Urine: 37.1 %
Alpha-1-Globulin, U: 2.7 %
Alpha-2-Globulin, U: 16.1 %
Beta Globulin, U: 28.7 %
Gamma Globulin, U: 15.4 %
Protein, Ur: 7.7 mg/dL

## 2024-01-22 LAB — CALCITONIN: Calcitonin: <2 pg/mL (ref 0.0–5.0)

## 2024-01-22 MED ORDER — TIRZEPATIDE 7.5 MG/0.5ML ~~LOC~~ SOAJ: 7.5000 mg | SUBCUTANEOUS | 1 refills | Status: AC

## 2024-01-22 NOTE — Progress Notes (Signed)
 Please schedule with Hematology on next follow up

## 2024-02-19 ENCOUNTER — Telehealth: Payer: Self-pay | Admitting: Nurse Practitioner

## 2024-02-19 NOTE — Telephone Encounter (Signed)
 Lvm to move 02/21/24 appointment-Toni

## 2024-02-21 ENCOUNTER — Ambulatory Visit: Admitting: Nurse Practitioner

## 2024-03-04 ENCOUNTER — Ambulatory Visit: Admitting: Nurse Practitioner

## 2024-03-04 ENCOUNTER — Encounter: Payer: Self-pay | Admitting: Nurse Practitioner

## 2024-03-04 VITALS — BP 130/76 | HR 96 | Temp 95.8°F | Resp 16 | Ht 63.5 in | Wt 164.2 lb

## 2024-03-04 DIAGNOSIS — R7689 Other specified abnormal immunological findings in serum: Secondary | ICD-10-CM | POA: Diagnosis not present

## 2024-03-04 DIAGNOSIS — E1169 Type 2 diabetes mellitus with other specified complication: Secondary | ICD-10-CM | POA: Diagnosis not present

## 2024-03-04 DIAGNOSIS — E119 Type 2 diabetes mellitus without complications: Secondary | ICD-10-CM | POA: Diagnosis not present

## 2024-03-04 DIAGNOSIS — E785 Hyperlipidemia, unspecified: Secondary | ICD-10-CM

## 2024-03-04 DIAGNOSIS — E669 Obesity, unspecified: Secondary | ICD-10-CM

## 2024-03-04 DIAGNOSIS — N1831 Chronic kidney disease, stage 3a: Secondary | ICD-10-CM

## 2024-03-04 NOTE — Progress Notes (Signed)
 [facility] [address] [place], [place] [address]  Internal MEDICINE  Office Visit Note  Patient Name: Rhonda Sanders  [id number]  [id number]  Date of Service: 03/04/2024  Chief Complaint  Patient presents with   Hyperlipidemia   Hypertension   Follow-up    HPI Rhonda Sanders presents for a follow-up visit for lab results.  Diabetes -- A1c is stable.  Calcium  level improved.  Elevated free light chains, all other labs were normal.  Will reschedule pap smear.     Current Medication: Outpatient Encounter Medications as of 03/04/2024  Medication Sig   amLODipine  (NORVASC ) 5 MG tablet Take 1 tablet (5 mg total) by mouth daily.   bisoprolol -hydrochlorothiazide  (ZIAC ) 2.5-6.25 MG tablet Take 1 tablet by mouth daily.   cetirizine  (ZYRTEC ) 10 MG tablet Take 1 tablet (10 mg total) by mouth daily.   hydrochlorothiazide  (HYDRODIURIL ) 25 MG tablet Take 1 tablet (25 mg total) by mouth daily.   lisinopril  (ZESTRIL ) 40 MG tablet TAKE 1 TABLET BY MOUTH EVERY DAY   montelukast  (SINGULAIR ) 10 MG tablet TAKE 1 TABLET BY MOUTH AT BEDTIME   Multiple Vitamin (MULTIVITAMIN) tablet Take 1 tablet by mouth daily.   pneumococcal 20-valent conjugate vaccine (PREVNAR 20) 0.5 ML injection Inject 0.5 mLs into the muscle once as needed for up to 1 dose for immunization.   rosuvastatin  (CRESTOR ) 5 MG tablet Take 1 tablet (5 mg total) by mouth daily.   tirzepatide  (MOUNJARO ) 7.5 MG/0.5ML Pen Inject 7.5 mg into the skin once a week.   venlafaxine  (EFFEXOR ) 75 MG tablet Take 1 tablet (75 mg total) by mouth daily.   Zoster Vaccine Adjuvanted [facility]) injection Inject 0.5 mLs into the muscle once as needed for up to 1 dose.   No facility-administered encounter medications on file as of 03/04/2024.    Surgical History: Past Surgical History:  Procedure Laterality Date   ABDOMINAL HYSTERECTOMY  04/2006   partial   BREAST BIOPSY Right 2014   CORE W/CLIP - NEG   BREAST BIOPSY Right 2015   CORE W/OUT  CLIP - NEG COLUMNAR CELL CHANGE WITH MICRO CALCIFICATIONS.    BREAST EXCISIONAL BIOPSY Right 2010   EXCISIONAL -ALH, columnar cell hyperplasia   COLONOSCOPY WITH PROPOFOL  N/A 05/26/2015   Procedure: COLONOSCOPY WITH PROPOFOL ;  Surgeon: Reyes LELON Cota, MD;  Location: [facility] ENDOSCOPY;  Service: Endoscopy;  Laterality: N/A;    Medical History: Past Medical History:  Diagnosis Date   Actinic keratosis 07/18/2012   R distal med pretibial   Allergy    seasonal    Atypical lobular hyperplasia of right breast 12/25/2008   Sngle focus noted in area of columnar cell hyperplasia   Basal cell carcinoma 06/19/2006   sternum   BCC (basal cell carcinoma of skin) 05/17/2009   L lat chest sup to breast   BCC (basal cell carcinoma of skin) 05/17/2009   R med mid calf   BCC (basal cell carcinoma of skin) 06/02/2013   R sup distal lat pretibial   BCC (basal cell carcinoma of skin) 06/02/2013   R inf distal lat pretibial   BCC (basal cell carcinoma of skin) 12/08/2015   R inf med knee   BCC (basal cell carcinoma of skin) 12/08/2015   R mid to distal lat pretibial   BCC (basal cell carcinoma of skin) 05/03/2016   L lat pretibial below knee   BCC (basal cell carcinoma of skin) 05/03/2016   L prox calf   BCC (basal cell carcinoma of skin) 05/03/2016  R prox calf prox   BCC (basal cell carcinoma of skin) 05/03/2016   R prox calf distal   Hypercholesterolemia    Hypertension    PONV (postoperative nausea and vomiting)     Family History: Family History  Problem Relation Age of Onset   Breast cancer Mother 25   Heart disease Father        first MI (47s), died age 37 - MI   Heart disease Paternal Grandfather    Heart disease Paternal Uncle    CVA Paternal Grandmother    Breast cancer Maternal Aunt 40   Crohn's disease Brother    Stomach cancer Maternal Grandfather    Colon cancer Neg Hx     Social History   Socioeconomic History   Marital status: Single    Spouse name: Not on file    Number of children: 0   Years of education: Not on file   Highest education level: Not on file  Occupational History   Not on file  Tobacco Use   Smoking status: Never   Smokeless tobacco: Never  Vaping Use   Vaping status: Never Used  Substance and Sexual Activity   Alcohol use: Yes    Alcohol/week: 0.0 standard drinks of alcohol    Comment: occasionally   Drug use: No   Sexual activity: Not on file  Other Topics Concern   Not on file  Social History Narrative   Not on file   Social Drivers of Health   Tobacco Use: Low Risk (03/04/2024)   Patient History    Smoking Tobacco Use: Never    Smokeless Tobacco Use: Never    Passive Exposure: Not on file  Financial Resource Strain: Not on file  Food Insecurity: Not on file  Transportation Needs: Not on file  Physical Activity: Not on file  Stress: Not on file  Social Connections: Not on file  Intimate Partner Violence: Not on file  Depression (PHQ2-9): Low Risk (01/01/2024)   Depression (PHQ2-9)    PHQ-2 Score: 0  Alcohol Screen: Low Risk (01/01/2024)   Alcohol Screen    Last Alcohol Screening Score (AUDIT): 2  Housing: Not on file  Utilities: Not on file  Health Literacy: Not on file      Review of Systems  Constitutional:  Negative for chills, fatigue and unexpected weight change.  HENT:  Positive for postnasal drip. Negative for congestion, rhinorrhea, sneezing and sore throat.   Eyes:  Negative for redness.  Respiratory:  Negative for cough, chest tightness and shortness of breath.   Cardiovascular:  Negative for chest pain and palpitations.  Gastrointestinal:  Negative for abdominal pain, constipation, diarrhea, nausea and vomiting.  Genitourinary:  Negative for dysuria and frequency.  Musculoskeletal:  Negative for arthralgias, back pain, joint swelling and neck pain.  Skin:  Negative for rash.  Neurological: Negative.  Negative for tremors and numbness.  Hematological:  Negative for adenopathy. Does not  bruise/bleed easily.  Psychiatric/Behavioral:  Negative for behavioral problems (Depression), sleep disturbance and suicidal ideas. The patient is not nervous/anxious.     Vital Signs: BP 130/76   Pulse 96   Temp (!) 95.8 F (35.4 C)   Resp 16   Ht 5' 3.5 (1.613 m)   Wt 164 lb 3.2 oz (74.5 kg)   SpO2 97%   BMI 28.63 kg/m    Physical Exam Vitals reviewed.  Constitutional:      Appearance: Normal appearance.  HENT:     Head: Normocephalic and atraumatic.  Nose: Nose normal.     Mouth/Throat:     Mouth: Mucous membranes are moist.     Pharynx: No posterior oropharyngeal erythema.  Eyes:     Extraocular Movements: Extraocular movements intact.     Pupils: Pupils are equal, round, and reactive to light.  Cardiovascular:     Pulses: Normal pulses.     Heart sounds: Normal heart sounds.  Pulmonary:     Effort: Pulmonary effort is normal.     Breath sounds: Normal breath sounds.  Neurological:     General: No focal deficit present.     Mental Status: She is alert.  Psychiatric:        Mood and Affect: Mood normal.        Behavior: Behavior normal.        Assessment/Plan: 1. Elevated serum immunoglobulin free light chain level (Primary) Referred to hematology/oncology.  - Ambulatory referral to Hematology / Oncology  2. Type 2 diabetes mellitus in patient with obesity (HCC) Stable, Continue medication and diabetic diet as discussed.   3. Hypercalcemia Improved to normal range.   4. Stage 3a chronic kidney disease (HCC) Stable, continue periodic monitoring.   5. Hyperlipidemia associated with type 2 diabetes mellitus (HCC) Continue rosuvastatin  as prescribed.    General Counseling: Rhonda Sanders understanding of the findings of todays visit and agrees with plan of treatment. I have discussed any further diagnostic evaluation that may be needed or ordered today. We also reviewed her medications today. she has been encouraged to call the office with any  questions or concerns that should arise related to todays visit.    Orders Placed This Encounter  Procedures   Ambulatory referral to Hematology / Oncology    No orders of the defined types were placed in this encounter.   Return for F/U, PAP ONLY, Rhonda Sanders PCP in january .   Total time spent:30 Minutes Time spent includes review of chart, medications, test results, and follow up plan with the patient.   [place] Controlled Substance Database was reviewed by me.  This patient was seen by Mardy Maxin, FNP-C in collaboration with Dr. Sigrid Bathe as a part of collaborative care agreement.   Bertel Venard R. Maxin, MSN, FNP-C Internal medicine

## 2024-03-08 ENCOUNTER — Encounter: Payer: Self-pay | Admitting: Nurse Practitioner

## 2024-03-15 ENCOUNTER — Other Ambulatory Visit: Payer: Self-pay | Admitting: Nurse Practitioner

## 2024-03-15 DIAGNOSIS — J309 Allergic rhinitis, unspecified: Secondary | ICD-10-CM

## 2024-03-15 DIAGNOSIS — E1169 Type 2 diabetes mellitus with other specified complication: Secondary | ICD-10-CM

## 2024-03-18 ENCOUNTER — Inpatient Hospital Stay

## 2024-03-18 ENCOUNTER — Inpatient Hospital Stay: Attending: Oncology | Admitting: Oncology

## 2024-03-18 ENCOUNTER — Encounter: Payer: Self-pay | Admitting: Oncology

## 2024-03-18 VITALS — BP 140/80 | HR 91 | Temp 97.7°F | Resp 18 | Wt 164.4 lb

## 2024-03-18 DIAGNOSIS — D75839 Thrombocytosis, unspecified: Secondary | ICD-10-CM | POA: Insufficient documentation

## 2024-03-18 DIAGNOSIS — Z800 Family history of malignant neoplasm of digestive organs: Secondary | ICD-10-CM | POA: Insufficient documentation

## 2024-03-18 DIAGNOSIS — R7689 Other specified abnormal immunological findings in serum: Secondary | ICD-10-CM | POA: Insufficient documentation

## 2024-03-18 DIAGNOSIS — Z85828 Personal history of other malignant neoplasm of skin: Secondary | ICD-10-CM | POA: Insufficient documentation

## 2024-03-18 DIAGNOSIS — Z803 Family history of malignant neoplasm of breast: Secondary | ICD-10-CM | POA: Insufficient documentation

## 2024-03-18 LAB — COMPREHENSIVE METABOLIC PANEL WITH GFR
ALT: 30 U/L (ref 0–44)
AST: 30 U/L (ref 15–41)
Albumin: 5.1 g/dL — ABNORMAL HIGH (ref 3.5–5.0)
Alkaline Phosphatase: 99 U/L (ref 38–126)
Anion gap: 13 (ref 5–15)
BUN: 25 mg/dL — ABNORMAL HIGH (ref 8–23)
CO2: 27 mmol/L (ref 22–32)
Calcium: 10.5 mg/dL — ABNORMAL HIGH (ref 8.9–10.3)
Chloride: 98 mmol/L (ref 98–111)
Creatinine, Ser: 1.06 mg/dL — ABNORMAL HIGH (ref 0.44–1.00)
GFR, Estimated: 59 mL/min — ABNORMAL LOW
Glucose, Bld: 119 mg/dL — ABNORMAL HIGH (ref 70–99)
Potassium: 3.9 mmol/L (ref 3.5–5.1)
Sodium: 138 mmol/L (ref 135–145)
Total Bilirubin: 0.5 mg/dL (ref 0.0–1.2)
Total Protein: 8.4 g/dL — ABNORMAL HIGH (ref 6.5–8.1)

## 2024-03-18 LAB — CBC WITH DIFFERENTIAL/PLATELET
Abs Immature Granulocytes: 0.03 K/uL (ref 0.00–0.07)
Basophils Absolute: 0.1 K/uL (ref 0.0–0.1)
Basophils Relative: 1 %
Eosinophils Absolute: 0.1 K/uL (ref 0.0–0.5)
Eosinophils Relative: 1 %
HCT: 42.4 % (ref 36.0–46.0)
Hemoglobin: 14.3 g/dL (ref 12.0–15.0)
Immature Granulocytes: 0 %
Lymphocytes Relative: 33 %
Lymphs Abs: 3 K/uL (ref 0.7–4.0)
MCH: 30 pg (ref 26.0–34.0)
MCHC: 33.7 g/dL (ref 30.0–36.0)
MCV: 89.1 fL (ref 80.0–100.0)
Monocytes Absolute: 0.5 K/uL (ref 0.1–1.0)
Monocytes Relative: 5 %
Neutro Abs: 5.4 K/uL (ref 1.7–7.7)
Neutrophils Relative %: 60 %
Platelets: 475 K/uL — ABNORMAL HIGH (ref 150–400)
RBC: 4.76 MIL/uL (ref 3.87–5.11)
RDW: 12.3 % (ref 11.5–15.5)
WBC: 9 K/uL (ref 4.0–10.5)
nRBC: 0 % (ref 0.0–0.2)

## 2024-03-18 NOTE — Assessment & Plan Note (Signed)
 Slightly increased free kappa light chain with mildly increased kappa/lambda light chain ratio.this is non specific can be due to inflammation.  Check cbc cmp , myeloma panel, light chain ration, 24 UPEP

## 2024-03-18 NOTE — Progress Notes (Signed)
 " Hematology/Oncology Progress note [phone] [phone]      Patient Care Team: Liana Fish, NP as PCP - General (Nurse Practitioner) Dessa Reyes ORN, MD (General Surgery) Babara Call, MD as Consulting Physician (Hematology and Oncology)  REFERRING PROVIDER: Liana Fish, NP  CHIEF COMPLAINTS/REASON FOR VISIT:  Evaluation of abnormal SPEP  ASSESSMENT & PLAN:   Elevated serum immunoglobulin free light chain level Slightly increased free kappa light chain with mildly increased kappa/lambda light chain ratio.this is non specific can be due to inflammation.  Check cbc cmp , myeloma panel, light chain ration, 24 UPEP  Hypercalcemia Intermittent hypercalcemia. normal PTH  Likely due to hydrochlorothiazide  use.  Check PTH rp   Thrombocytosis Chronic intermittent thrombocytosis.  Previous work up showed negative Jak 2 V6 102F, CAL R, exon 12-15, MPL mutation. BCR ABL1 FISH negative.  Normal iron TIBC ferritin. Likely reactive   Orders Placed This Encounter  Procedures   CBC with Differential/Platelet    Standing Status:   Future    Number of Occurrences:   1    Expected Date:   03/18/2024    Expiration Date:   06/16/2024   Multiple Myeloma Panel (SPEP&IFE w/QIG)    Standing Status:   Future    Number of Occurrences:   1    Expected Date:   03/18/2024    Expiration Date:   06/16/2024   Kappa/lambda light chains    Standing Status:   Future    Number of Occurrences:   1    Expected Date:   03/18/2024    Expiration Date:   06/16/2024   IFE+PROTEIN ELECTRO, 24-HR UR    Standing Status:   Future    Expected Date:   03/18/2024    Expiration Date:   06/16/2024   Comprehensive metabolic panel with GFR    Standing Status:   Future    Number of Occurrences:   1    Expected Date:   03/18/2024    Expiration Date:   06/16/2024   Follow up in a few weeks.  All questions were answered. The patient knows to call the clinic with any problems, questions or  concerns.  Call Babara, MD, PhD [facility] Hematology Oncology 03/18/2024    HISTORY OF PRESENTING ILLNESS:  Rhonda Sanders is a 64 y.o. female who was seen in consultation at the request of Liana Fish, NP for evaluation of abnormal SPEP results.   Patient recently had work up done for elevated creatinine.  Labs reviewed,  SPEP showed no M spike,   increased free kappa light chain 24.6.  Slightly increased kappa/lambda ratio 1.71. She denies history of abnormal bone pain or bone fracture. Patient denies history of recurrent infection or atypical infections such as shingles of meningitis. Denies chills, night sweats, anorexia or abnormal weight loss.   intermittent hypercalcemia and thrombocytosis since 2017-04-18 She has been on bisoprolol /hydrochlorothiazide  for hypertension and bloating for over 20 years  She has a remote diagnosis of atypical lobular hyperplasia of the right breast, diagnosed in Apr 18, 2008, and underwent negative genetic testing for breast cancer in April 18, 2010. Her mother died of breast cancer.    MEDICAL HISTORY:  Past Medical History:  Diagnosis Date   Actinic keratosis 07/18/2012   R distal med pretibial   Allergy    seasonal    Atypical lobular hyperplasia of right breast 12/25/2008   Sngle focus noted in area of columnar cell hyperplasia   Basal cell carcinoma 06/19/2006   sternum   BCC (  basal cell carcinoma of skin) 05/17/2009   L lat chest sup to breast   BCC (basal cell carcinoma of skin) 05/17/2009   R med mid calf   BCC (basal cell carcinoma of skin) 06/02/2013   R sup distal lat pretibial   BCC (basal cell carcinoma of skin) 06/02/2013   R inf distal lat pretibial   BCC (basal cell carcinoma of skin) 12/08/2015   R inf med knee   BCC (basal cell carcinoma of skin) 12/08/2015   R mid to distal lat pretibial   BCC (basal cell carcinoma of skin) 05/03/2016   L lat pretibial below knee   BCC (basal cell carcinoma of skin) 05/03/2016   L prox calf   BCC  (basal cell carcinoma of skin) 05/03/2016   R prox calf prox   BCC (basal cell carcinoma of skin) 05/03/2016   R prox calf distal   Hypercholesterolemia    Hypertension    PONV (postoperative nausea and vomiting)     SURGICAL HISTORY: Past Surgical History:  Procedure Laterality Date   ABDOMINAL HYSTERECTOMY  04/2006   partial   BREAST BIOPSY Right 2014   CORE W/CLIP - NEG   BREAST BIOPSY Right 2015   CORE W/OUT CLIP - NEG COLUMNAR CELL CHANGE WITH MICRO CALCIFICATIONS.    BREAST EXCISIONAL BIOPSY Right 2010   EXCISIONAL -ALH, columnar cell hyperplasia   COLONOSCOPY WITH PROPOFOL  N/A 05/26/2015   Procedure: COLONOSCOPY WITH PROPOFOL ;  Surgeon: Reyes LELON Cota, MD;  Location: [facility] ENDOSCOPY;  Service: Endoscopy;  Laterality: N/A;    SOCIAL HISTORY: Social History   Socioeconomic History   Marital status: Single    Spouse name: Not on file   Number of children: 0   Years of education: Not on file   Highest education level: Not on file  Occupational History   Not on file  Tobacco Use   Smoking status: Never   Smokeless tobacco: Never  Vaping Use   Vaping status: Never Used  Substance and Sexual Activity   Alcohol use: Yes    Alcohol/week: 0.0 standard drinks of alcohol    Comment: occasionally   Drug use: No   Sexual activity: Not on file  Other Topics Concern   Not on file  Social History Narrative   Not on file   Social Drivers of Health   Tobacco Use: Low Risk (03/18/2024)   Patient History    Smoking Tobacco Use: Never    Smokeless Tobacco Use: Never    Passive Exposure: Not on file  Financial Resource Strain: Not on file  Food Insecurity: No Food Insecurity (03/17/2024)   Epic    Worried About Programme Researcher, Broadcasting/film/video in the Last Year: Never true    Ran Out of Food in the Last Year: Never true  Transportation Needs: No Transportation Needs (03/17/2024)   Epic    Lack of Transportation (Medical): No    Lack of Transportation (Non-Medical): No  Physical  Activity: Not on file  Stress: Not on file  Social Connections: Not on file  Intimate Partner Violence: Not on file  Depression [phone]): Low Risk (01/01/2024)   Depression (PHQ2-9)    PHQ-2 Score: 0  Alcohol Screen: Low Risk (01/01/2024)   Alcohol Screen    Last Alcohol Screening Score (AUDIT): 2  Housing: Low Risk (03/17/2024)   Epic    Unable to Pay for Housing in the Last Year: No    Number of Times Moved in the Last Year: 0  Homeless in the Last Year: No  Utilities: Not At Risk (03/17/2024)   Epic    Threatened with loss of utilities: No  Health Literacy: Not on file    FAMILY HISTORY: Family History  Problem Relation Age of Onset   Breast cancer Mother 86   Heart disease Father        first MI (81s), died age 34 - MI   Heart disease Paternal Grandfather    Heart disease Paternal Uncle    CVA Paternal Grandmother    Breast cancer Maternal Aunt 40   Crohn's disease Brother    Stomach cancer Maternal Grandfather    Colon cancer Neg Hx     ALLERGIES:  has no known allergies.  MEDICATIONS:  Current Outpatient Medications  Medication Sig Dispense Refill   amLODipine  (NORVASC ) 5 MG tablet Take 1 tablet (5 mg total) by mouth daily. 90 tablet 3   bisoprolol -hydrochlorothiazide  (ZIAC ) 2.5-6.25 MG tablet Take 1 tablet by mouth daily. 30 tablet 1   cetirizine  (ZYRTEC ) 10 MG tablet TAKE 1 TABLET BY MOUTH EVERY DAY 90 tablet 3   hydrochlorothiazide  (HYDRODIURIL ) 25 MG tablet Take 1 tablet (25 mg total) by mouth daily. 90 tablet 3   lisinopril  (ZESTRIL ) 40 MG tablet TAKE 1 TABLET BY MOUTH EVERY DAY 90 tablet 3   montelukast  (SINGULAIR ) 10 MG tablet TAKE 1 TABLET BY MOUTH AT BEDTIME 90 tablet 3   Multiple Vitamin (MULTIVITAMIN) tablet Take 1 tablet by mouth daily.     pneumococcal 20-valent conjugate vaccine (PREVNAR 20) 0.5 ML injection Inject 0.5 mLs into the muscle once as needed for up to 1 dose for immunization. 0.5 mL 0   rosuvastatin  (CRESTOR ) 5 MG tablet TAKE 1 TABLET  (5 MG TOTAL) BY MOUTH DAILY. 90 tablet 3   tirzepatide  (MOUNJARO ) 7.5 MG/0.5ML Pen Inject 7.5 mg into the skin once a week. 6 mL 1   venlafaxine  (EFFEXOR ) 75 MG tablet Take 1 tablet (75 mg total) by mouth daily. 90 tablet 3   Zoster Vaccine Adjuvanted [facility]) injection Inject 0.5 mLs into the muscle once as needed for up to 1 dose. 0.5 mL 1   No current facility-administered medications for this visit.    Review of Systems  Constitutional:  Negative for appetite change, chills, fatigue and fever.  HENT:   Negative for hearing loss and voice change.   Eyes:  Negative for eye problems.  Respiratory:  Negative for chest tightness and cough.   Cardiovascular:  Negative for chest pain.  Gastrointestinal:  Negative for abdominal distention, abdominal pain and blood in stool.  Endocrine: Negative for hot flashes.  Genitourinary:  Negative for difficulty urinating and frequency.   Musculoskeletal:  Negative for arthralgias.  Skin:  Negative for itching and rash.  Neurological:  Negative for extremity weakness.  Hematological:  Negative for adenopathy.  Psychiatric/Behavioral:  Negative for confusion.    PHYSICAL EXAMINATION: ECOG PERFORMANCE STATUS: 0 - Asymptomatic Vitals:   03/18/24 1408  BP: (!) 140/80  Pulse: 91  Resp: 18  Temp: 97.7 F (36.5 C)  SpO2: 99%   Filed Weights   03/18/24 1408  Weight: 164 lb 6.4 oz (74.6 kg)    Physical Exam Constitutional:      General: She is not in acute distress. HENT:     Head: Normocephalic and atraumatic.  Eyes:     General: No scleral icterus. Cardiovascular:     Rate and Rhythm: Normal rate and regular rhythm.  Pulmonary:     Effort: Pulmonary  effort is normal. No respiratory distress.     Breath sounds: Normal breath sounds. No wheezing.  Abdominal:     General: Bowel sounds are normal. There is no distension.     Palpations: Abdomen is soft.  Musculoskeletal:        General: No deformity. Normal range of motion.      Cervical back: Normal range of motion and neck supple.  Skin:    General: Skin is warm and dry.     Findings: No erythema or rash.  Neurological:     Mental Status: She is alert and oriented to person, place, and time. Mental status is at baseline.  Psychiatric:        Mood and Affect: Mood normal.      LABORATORY DATA:  I have reviewed the data as listed    Latest Ref Rng & Units 03/18/2024    2:51 PM 12/14/2023   10:02 AM 11/24/2022    9:26 AM  CBC  WBC 4.0 - 10.5 K/uL 9.0  10.6  8.3   Hemoglobin 12.0 - 15.0 g/dL 85.6  84.9  85.8   Hematocrit 36.0 - 46.0 % 42.4  46.7  42.9   Platelets 150 - 400 K/uL 475  476  460       Latest Ref Rng & Units 03/18/2024    2:51 PM 01/17/2024    3:43 PM 01/17/2024    3:42 PM  CMP  Glucose 70 - 99 mg/dL 880  98    BUN 8 - 23 mg/dL 25  32    Creatinine 9.55 - 1.00 mg/dL 8.93  8.90    Sodium 864 - 145 mmol/L 138  139    Potassium 3.5 - 5.1 mmol/L 3.9  4.3    Chloride 98 - 111 mmol/L 98  99    CO2 22 - 32 mmol/L 27  24    Calcium  8.9 - 10.3 mg/dL 89.4  89.7    Total Protein 6.5 - 8.1 g/dL 8.4   7.8   Total Bilirubin 0.0 - 1.2 mg/dL 0.5     Alkaline Phos 38 - 126 U/L 99     AST 15 - 41 U/L 30     ALT 0 - 44 U/L 30               RADIOGRAPHIC STUDIES: I have personally reviewed the radiological images as listed and agreed with the findings in the report.  No results found.  "

## 2024-03-18 NOTE — Assessment & Plan Note (Addendum)
 Intermittent hypercalcemia. normal PTH  Likely due to hydrochlorothiazide  use.  Check PTH rp

## 2024-03-18 NOTE — Assessment & Plan Note (Addendum)
 Chronic intermittent thrombocytosis.  Previous work up showed negative Jak 2 V6 53F, CAL R, exon 12-15, MPL mutation. BCR ABL1 FISH negative.  Normal iron TIBC ferritin. Likely reactive

## 2024-03-19 LAB — KAPPA/LAMBDA LIGHT CHAINS
Kappa free light chain: 25.2 mg/L — ABNORMAL HIGH (ref 3.3–19.4)
Kappa, lambda light chain ratio: 1.56 (ref 0.26–1.65)
Lambda free light chains: 16.2 mg/L (ref 5.7–26.3)

## 2024-03-20 LAB — MULTIPLE MYELOMA PANEL, SERUM
Albumin SerPl Elph-Mcnc: 4.4 g/dL (ref 2.9–4.4)
Albumin/Glob SerPl: 1.4 (ref 0.7–1.7)
Alpha 1: 0.2 g/dL (ref 0.0–0.4)
Alpha2 Glob SerPl Elph-Mcnc: 0.8 g/dL (ref 0.4–1.0)
B-Globulin SerPl Elph-Mcnc: 1.2 g/dL (ref 0.7–1.3)
Gamma Glob SerPl Elph-Mcnc: 1.1 g/dL (ref 0.4–1.8)
Globulin, Total: 3.3 g/dL (ref 2.2–3.9)
IgA: 176 mg/dL (ref 87–352)
IgG (Immunoglobin G), Serum: 1063 mg/dL (ref 586–1602)
IgM (Immunoglobulin M), Srm: 215 mg/dL (ref 26–217)
Total Protein ELP: 7.7 g/dL (ref 6.0–8.5)

## 2024-03-28 ENCOUNTER — Other Ambulatory Visit: Payer: Self-pay

## 2024-03-28 DIAGNOSIS — R7689 Other specified abnormal immunological findings in serum: Secondary | ICD-10-CM

## 2024-04-02 LAB — IFE+PROTEIN ELECTRO, 24-HR UR
% BETA, Urine: 0 %
ALPHA 1 URINE: 0 %
Albumin, U: 100 %
Alpha 2, Urine: 0 %
GAMMA GLOBULIN URINE: 0 %
Total Protein, Urine-Ur/day: 81 mg/(24.h) (ref 30–150)
Total Protein, Urine: 9 mg/dL
Total Volume: 900

## 2024-04-08 ENCOUNTER — Ambulatory Visit: Admitting: Nurse Practitioner

## 2024-04-09 ENCOUNTER — Telehealth: Payer: Self-pay | Admitting: Oncology

## 2024-04-09 NOTE — Telephone Encounter (Signed)
 Due to MD schedule being overbooked, I called and lvm for pt if she wants to change her appt from 2/11 in person to 2/13 virtual. Scheduling phone number provided for pt

## 2024-04-11 ENCOUNTER — Other Ambulatory Visit: Payer: Self-pay | Admitting: Internal Medicine

## 2024-04-15 ENCOUNTER — Ambulatory Visit: Admitting: Nurse Practitioner

## 2024-04-16 ENCOUNTER — Inpatient Hospital Stay: Admitting: Oncology

## 2024-04-18 ENCOUNTER — Inpatient Hospital Stay: Admitting: Oncology

## 2024-09-04 ENCOUNTER — Encounter: Admitting: Nurse Practitioner
